# Patient Record
Sex: Female | Born: 1940 | Race: White | Hispanic: No | Marital: Married | Smoking: Never smoker
Health system: Southern US, Community
[De-identification: ages and names within clinical notes are randomized; demographics above are authoritative.]

## PROBLEM LIST (undated history)

## (undated) DIAGNOSIS — I493 Ventricular premature depolarization: Secondary | ICD-10-CM

## (undated) DIAGNOSIS — I340 Nonrheumatic mitral (valve) insufficiency: Secondary | ICD-10-CM

## (undated) DIAGNOSIS — I471 Supraventricular tachycardia, unspecified: Secondary | ICD-10-CM

## (undated) DIAGNOSIS — K219 Gastro-esophageal reflux disease without esophagitis: Secondary | ICD-10-CM

## (undated) DIAGNOSIS — I10 Essential (primary) hypertension: Secondary | ICD-10-CM

## (undated) DIAGNOSIS — I2510 Atherosclerotic heart disease of native coronary artery without angina pectoris: Secondary | ICD-10-CM

## (undated) DIAGNOSIS — Z8371 Family history of colonic polyps: Secondary | ICD-10-CM

## (undated) DIAGNOSIS — G629 Polyneuropathy, unspecified: Secondary | ICD-10-CM

## (undated) DIAGNOSIS — D509 Iron deficiency anemia, unspecified: Secondary | ICD-10-CM

## (undated) DIAGNOSIS — R42 Dizziness and giddiness: Secondary | ICD-10-CM

## (undated) DIAGNOSIS — M1990 Unspecified osteoarthritis, unspecified site: Secondary | ICD-10-CM

## (undated) DIAGNOSIS — Z83719 Family history of colon polyps, unspecified: Secondary | ICD-10-CM

## (undated) DIAGNOSIS — E039 Hypothyroidism, unspecified: Secondary | ICD-10-CM

## (undated) DIAGNOSIS — E785 Hyperlipidemia, unspecified: Secondary | ICD-10-CM

## (undated) HISTORY — PX: OTHER SURGICAL HISTORY: SHX169

## (undated) HISTORY — DX: Supraventricular tachycardia: I47.1

## (undated) HISTORY — DX: Essential (primary) hypertension: I10

## (undated) HISTORY — DX: Ventricular premature depolarization: I49.3

## (undated) HISTORY — DX: Iron deficiency anemia, unspecified: D50.9

## (undated) HISTORY — DX: Hyperlipidemia, unspecified: E78.5

## (undated) HISTORY — DX: Gastro-esophageal reflux disease without esophagitis: K21.9

## (undated) HISTORY — DX: Family history of colonic polyps: Z83.71

## (undated) HISTORY — PX: DILATION AND CURETTAGE OF UTERUS: SHX78

## (undated) HISTORY — PX: ABDOMINAL HYSTERECTOMY: SHX81

## (undated) HISTORY — DX: Nonrheumatic mitral (valve) insufficiency: I34.0

## (undated) HISTORY — DX: Supraventricular tachycardia, unspecified: I47.10

## (undated) HISTORY — PX: EYE SURGERY: SHX253

## (undated) HISTORY — DX: Family history of colon polyps, unspecified: Z83.719

## (undated) HISTORY — PX: ANKLE SURGERY: SHX546

## (undated) HISTORY — PX: TONSILLECTOMY: SUR1361

## (undated) SURGERY — Surgical Case
Anesthesia: *Unknown

---

## 2004-09-17 ENCOUNTER — Ambulatory Visit: Payer: Self-pay | Admitting: Ophthalmology

## 2004-09-17 HISTORY — PX: EYE SURGERY: SHX253

## 2005-08-07 ENCOUNTER — Ambulatory Visit: Payer: Self-pay

## 2006-08-10 ENCOUNTER — Ambulatory Visit: Payer: Self-pay

## 2007-08-16 ENCOUNTER — Ambulatory Visit: Payer: Self-pay

## 2008-08-22 ENCOUNTER — Ambulatory Visit: Payer: Self-pay

## 2009-01-28 ENCOUNTER — Ambulatory Visit: Payer: Self-pay | Admitting: Internal Medicine

## 2009-01-28 ENCOUNTER — Inpatient Hospital Stay (HOSPITAL_COMMUNITY): Admission: EM | Admit: 2009-01-28 | Discharge: 2009-01-29 | Payer: Self-pay | Admitting: Emergency Medicine

## 2009-01-29 ENCOUNTER — Encounter: Payer: Self-pay | Admitting: Cardiology

## 2009-02-04 ENCOUNTER — Telehealth (INDEPENDENT_AMBULATORY_CARE_PROVIDER_SITE_OTHER): Payer: Self-pay | Admitting: *Deleted

## 2009-02-05 ENCOUNTER — Ambulatory Visit: Payer: Self-pay

## 2009-02-05 ENCOUNTER — Ambulatory Visit: Payer: Self-pay | Admitting: Cardiology

## 2009-02-05 ENCOUNTER — Encounter: Payer: Self-pay | Admitting: Cardiology

## 2009-02-06 ENCOUNTER — Encounter: Payer: Self-pay | Admitting: Cardiology

## 2009-02-13 ENCOUNTER — Ambulatory Visit: Payer: Self-pay | Admitting: Cardiology

## 2009-05-14 ENCOUNTER — Encounter: Payer: Self-pay | Admitting: Cardiology

## 2009-05-14 ENCOUNTER — Ambulatory Visit: Payer: Self-pay | Admitting: Cardiovascular Disease

## 2009-05-14 DIAGNOSIS — E782 Mixed hyperlipidemia: Secondary | ICD-10-CM | POA: Insufficient documentation

## 2009-05-21 LAB — CONVERTED CEMR LAB
ALT: 13 units/L (ref 0–35)
Albumin: 4 g/dL (ref 3.5–5.2)
Cholesterol: 182 mg/dL (ref 0–200)
Total CHOL/HDL Ratio: 2.6
Total Protein: 6.7 g/dL (ref 6.0–8.3)
Triglycerides: 102 mg/dL (ref <150)
VLDL: 20 mg/dL (ref 0–40)

## 2009-08-16 ENCOUNTER — Ambulatory Visit: Payer: Self-pay | Admitting: Cardiology

## 2009-08-29 ENCOUNTER — Ambulatory Visit: Payer: Self-pay

## 2009-11-14 ENCOUNTER — Ambulatory Visit: Payer: Self-pay | Admitting: Cardiology

## 2009-11-15 LAB — CONVERTED CEMR LAB
ALT: 17 units/L (ref 0–35)
Alkaline Phosphatase: 49 units/L (ref 39–117)
Cholesterol: 209 mg/dL — ABNORMAL HIGH (ref 0–200)
Indirect Bilirubin: 0.2 mg/dL (ref 0.0–0.9)
Total Protein: 6.9 g/dL (ref 6.0–8.3)
Triglycerides: 123 mg/dL (ref <150)

## 2010-01-17 ENCOUNTER — Telehealth: Payer: Self-pay | Admitting: Cardiology

## 2010-03-12 ENCOUNTER — Ambulatory Visit: Payer: Self-pay | Admitting: Unknown Physician Specialty

## 2010-07-31 ENCOUNTER — Telehealth: Payer: Self-pay | Admitting: Cardiology

## 2010-08-28 ENCOUNTER — Ambulatory Visit: Payer: Self-pay | Admitting: Cardiology

## 2010-08-28 DIAGNOSIS — I471 Supraventricular tachycardia: Secondary | ICD-10-CM

## 2010-08-28 DIAGNOSIS — I10 Essential (primary) hypertension: Secondary | ICD-10-CM

## 2010-09-25 ENCOUNTER — Ambulatory Visit: Payer: Self-pay

## 2010-11-18 NOTE — Progress Notes (Signed)
Summary: BP  Phone Note Call from Patient Call back at Home Phone [phone]   Caller: self Call For: Gwenevere Goga Summary of Call: was told to go off of beta blocker-bp has been higher this week-what should she do? Initial call taken by: Harlon Flor,  January 17, 2010 3:03 PM  Follow-up for Phone Call        pts BP 150/80.  States that it is usually much lower than this.  instructed pt that this is ok- should increase fluids and relax (pt states she has been cleaning most of the day).  Reiterated criteria for need for medical evaluation. Follow-up by: Charlena Cross, RN, BSN,  January 17, 2010 3:16 PM

## 2010-11-18 NOTE — Progress Notes (Signed)
Summary: FYI  Phone Note Call from Patient   Caller: Patient Call For: [facility] Summary of Call: Pt called wanted to let Dr. Shirlee Latch know that she is going to start going to the [place] because it is closer to where she lives. Wanted to make it clear that she was no upset with Dr. Shirlee Latch at all and appreciated the care he have her.  Initial call taken by: Benedict Needy, RN,  July 31, 2010 11:31 AM

## 2010-11-18 NOTE — Letter (Signed)
Summary: [place] [employer] Work Engineer, agricultural at [employer]  [address], [place] [address]   Phone: [phone]  Fax: [phone]     August 28, 2010 MRN: [id number]   Alejandra Singh [address] [facility] [address] [place], [place]  [address]      YOUR LAB WORK IS DUE  December 01, 2010 _________________________________________  Please go to [facility], located across the street from [facility] on the second floor.  Hours are Monday - Friday 7am until 7:30pm         Saturday 8am until 12noon    _X_  DO NOT EAT OR DRINK AFTER MIDNIGHT EVENING PRIOR TO LABWORK  __ YOUR LABWORK IS NOT FASTING --YOU MAY EAT PRIOR TO LABWORK

## 2010-11-18 NOTE — Assessment & Plan Note (Signed)
Summary: Alejandra Singh / pt switching from [place] office due to convenience/tg   Visit Type:  Follow-up Primary Provider:  [facility]   History of Present Illness: 70 year old woman presents for a followup visit. She is new to the [place] clinic, previously followed by Dr. Shirlee Latch in [place]. Her history is reviewed below including prior cardiac testing. Symptomatically she is quite stable, reporting no significant palpitations. She has no exertional chest pain or unusual breathlessness. She walks regularly with her husband for exercise.  Followup labs from January 2011 showed cholesterol 209, triglycerides 123, HDL 57, LDL 127, AST 16, ALT 17. She was on low-dose Pravachol at that time, subsequently advanced to 80 mg a day. Followup lipid numbers in May 2011 showed total cholesterol 179, triglycerides 129, HDL 62, and LDL 91. She unfortunately was not able to tolerate Pravachol at high dose and stopped the medicine back in August. She is interested in switching to a different medication.  Blood pressure today was elevated. We discussed this. At home she reports much better blood pressure control, recently "116/65." We discussed sodium restriction.  Current Medications (verified): 1)  Aspirin 81 Mg Tbec (Aspirin) .... Take One Tablet By Mouth Daily 2)  Fish Oil 1000 Mg Caps (Omega-3 Fatty Acids) .Marland Kitchen.. 1 By Mouth Daily 3)  Multivitamins  Tabs (Multiple Vitamin) .Marland Kitchen.. 1 By Mouth Daily 4)  Probiotic  Caps (Probiotic Product) .... Take 1 Cap Daily 5)  Metoprolol Succinate 25 Mg Xr24h-Tab (Metoprolol Succinate) .... Take 1 Tablet By Mouth As Needed Palpitations 6)  Simvastatin 20 Mg Tabs (Simvastatin) .... Take 1 Tablet By Mouth At Bedtime  Allergies (verified): No Known Drug Allergies  Comments:  Nurse/Medical Assistant: patient stated meds not taking pravastatin any longer due to joints hurt  Past History:  Family History: Last updated: 08/28/2010 No family history of CAD Father:  deceased at age 75 from CVA Mother: deceased with Alzheimer's dementia  Social History: Last updated: 08/28/2010 Married - lives in [place] Tobacco Use - No.  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 History of iron-deficiency anemia Colonic polyps GERD Osteoporosis  Past Surgical History: Unremarkable  Family History: No family history of CAD Father: deceased at age 6 from CVA Mother: deceased with Alzheimer's dementia  Social History: Married - lives in [place] Tobacco Use - No.  Alcohol Use - no Drug Use - no Retired Engineer, manufacturing  Review of Systems  The patient denies anorexia, fever, weight loss, chest pain, syncope, dyspnea on exertion, peripheral edema, melena, and hematochezia.         Patient experienced leg weakness and fatigue on Pravachol at high dose. Otherwise reviewed and negative except as outlined.  Vital Signs:  Patient profile:   70 year old female Weight:      150 pounds O2 Sat:      97 % on Room air Pulse rate:   65 / minute BP sitting:   151 / 79  (right arm)  Vitals Entered By: Dreama Saa, CNA (August 28, 2010 8:21 AM)  O2 Flow:  Room air  Physical Exam  Additional Exam:  Normally nourished appearing woman in no acute distress. HEENT: Conjunctiva and lids are normal, oropharynx clear. Neck: Supple, no elevated JVP or bruits. Lungs: Clear to auscultation, nonlabored. Cardiac: Regular rate and rhythm, no S3 gallop or pericardial rub. Abdomen: Soft, nontender, bowel sounds present. Skin: Warm and dry. Extremities: No pitting edema, distal pulses  full. Musculoskeletal: No gross deformities. Neuropsychiatric: Alert and oriented x3, affect appropriate.   Echocardiogram  Procedure date:  01/29/2009  Findings:       Left ventricle: The cavity size was normal. Wall thickness was   normal. Systolic function was normal. The estimated  ejection   fraction was in the range of 55% to 60%. Wall motion was normal;   there were no regional wall motion abnormalities. The pulmonary vein   flow pattern was normal. Doppler parameters are consistent with   abnormal left ventricular relaxation (grade 1 diastolic   dysfunction).   Aortic valve: The noncoronary cusp appeared mildly restricted.   Mildly calcified annulus. Trileaflet. Doppler: There was no   stenosis. No significant regurgitation.   Aorta: Aortic root is normal.   Mitral valve: Structurally normal valve. Leaflet separation was   normal. Doppler: Transvalvular velocity was within the normal range.   There was no evidence for stenosis. No regurgitation.   Left atrium: The interatrial septum was not well visualized but   appears mobile, suggesting an atrial septal aneurysm. The atrium was   normal in size.   Pulmonary veins: Normal doppler pattern.   Right ventricle: The cavity size was normal. Wall thickness was   normal. Systolic function was normal.   Pulmonic valve: Grossly normal.   Tricuspid valve: No TR doppler jet was measured, unable to estimate   PA systolic pressure. Doppler: No significant regurgitation.   Right atrium: The atrium was normal in size.   Pericardium: There was no pericardial effusion.   Systemic veins: Not well visualized.   Nuclear Study  Procedure date:  02/05/2009  Findings:      Exercise Myoview, 10.1 METs, no chest pain. No diagnostic ST segment changes. Normal perfusion with LVEF greater than 60%. No scar or ischemia.  Event Monitor  Procedure date:  03/08/2009  Findings:      No significant arrhythmias.  EKG  Procedure date:  08/28/2010  Findings:      Normal sinus rhythm at 61 beats per minute with nonspecific ST changes.  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 Prescription provided  for Toprol-XL 25 mg p.r.n. She has not required this medicine as yet. Continue annual followup, sooner if needed.  Her updated medication list for this problem includes:    Aspirin 81 Mg Tbec (Aspirin) .Marland Kitchen... Take one tablet by mouth daily    Metoprolol Succinate 25 Mg Xr24h-tab (Metoprolol succinate) .Marland Kitchen... Take 1 tablet by mouth as needed palpitations  Problem # 2:  ESSENTIAL HYPERTENSION, BENIGN (ICD-401.1)  Blood pressure elevated today. Patient states that this is typically not the case. I asked her to continue to make home blood pressure checks, avoid sodium in the diet. Continue to follow with primary care provider.  Her updated medication list for this problem includes:    Aspirin 81 Mg Tbec (Aspirin) .Marland Kitchen... Take one tablet by mouth daily    Metoprolol Succinate 25 Mg Xr24h-tab (Metoprolol succinate) .Marland Kitchen... Take 1 tablet by mouth as needed palpitations  Problem # 3:  MIXED HYPERLIPIDEMIA (ICD-272.2)  At this point will plan to initiate simvastatin 20 mg p.o. q.h.s. If she tolerates this better than Pravachol, than repeat fasting lipid profile and liver function tests in about 3 months. Will aim for LDL control at least under 100. Her HDL has looked good overall.  The following medications were removed from the medication list:    Pravastatin Sodium  40 Mg Tabs (Pravastatin sodium) .Marland Kitchen... 1 tab by mouth daily Her updated medication list for this problem includes:    Simvastatin 20 Mg Tabs (Simvastatin) .Marland Kitchen... Take 1 tablet by mouth at bedtime  Future Orders: T-Lipid Profile ([id number]) ... 12/01/2010 T-Hepatic Function [phone]) ... 12/01/2010  Patient Instructions: 1)  Your physician recommends that you schedule a follow-up appointment in: 1 year 2)  Your physician recommends that you return for lab work in: 12 weeks 3)  Your physician has recommended you make the following change in your medication: Start taking Simvastatin 20mg  by mouth at bedtime  Prescriptions: METOPROLOL  SUCCINATE 25 MG XR24H-TAB (METOPROLOL SUCCINATE) take 1 tablet by mouth as needed palpitations  #30 x 6   Entered by:   Larita Fife Via LPN   Authorized by:   Loreli Slot, MD, [facility]   Signed by:   Larita Fife Via LPN on 34/74/2595   Method used:   Print then Give to Patient   RxID:   [id number] SIMVASTATIN 20 MG TABS (SIMVASTATIN) take 1 tablet by mouth at bedtime  #30 x 11   Entered by:   Larita Fife Via LPN   Authorized by:   Loreli Slot, MD, [facility]   Signed by:   Larita Fife Via LPN on 16/60/6301   Method used:   Print then Give to Patient   RxID:   [id number]

## 2010-12-04 ENCOUNTER — Encounter: Payer: Self-pay | Admitting: Cardiology

## 2010-12-08 ENCOUNTER — Encounter (INDEPENDENT_AMBULATORY_CARE_PROVIDER_SITE_OTHER): Payer: Self-pay | Admitting: *Deleted

## 2010-12-08 LAB — CONVERTED CEMR LAB
Albumin: 4.2 g/dL (ref 3.5–5.2)
Alkaline Phosphatase: 47 units/L (ref 39–117)
LDL Cholesterol: 82 mg/dL (ref 0–99)
Total Bilirubin: 0.2 mg/dL — ABNORMAL LOW (ref 0.3–1.2)

## 2010-12-16 NOTE — Letter (Signed)
Summary: [place] Results Engineer, agricultural at [facility]  [address] [place] [address]   Phone: [phone]  Fax: [phone]      December 08, 2010 MRN: [id number]   Alejandra Singh [address] [facility] [address] [place], [place]  [address]   Dear Ms. [facility],  Your test ordered by Selena Batten has been reviewed by your physician (or physician assistant) and was found to be normal or stable. Your physician (or physician assistant) felt no changes were needed at this time.  ____ Echocardiogram  ____ Cardiac Stress Test  _x___ Lab Work  ____ Peripheral vascular study of arms, legs or neck  ____ CT scan or X-ray  ____ Lung or Breathing test  ____ Other:  Lipids look much better per Dr. Diona Browner. Continue your simvastatin. Thank you, Damiel Barthold Allyne Gee RN    [place]mouth Bing, MD, Lenise Arena.C.Gaylord Shih, MD, F.A.C.C Lewayne Bunting, MD, F.A.C.C Nona Dell, MD, F.A.C.C Charlton Haws, MD, Lenise Arena.C.C

## 2011-01-28 LAB — POCT I-STAT, CHEM 8
BUN: 9 mg/dL (ref 6–23)
Calcium, Ion: 1.08 mmol/L — ABNORMAL LOW (ref 1.12–1.32)
Creatinine, Ser: <0.2 mg/dL — ABNORMAL LOW (ref 0.4–1.2)
Glucose, Bld: 124 mg/dL — ABNORMAL HIGH (ref 70–99)
TCO2: 15 mmol/L (ref 0–100)

## 2011-01-28 LAB — DIFFERENTIAL
Basophils Absolute: 0 10*3/uL (ref 0.0–0.1)
Basophils Relative: 0 % (ref 0–1)
Eosinophils Absolute: 0.2 10*3/uL (ref 0.0–0.7)
Eosinophils Relative: 1 % (ref 0–5)
Eosinophils Relative: 3 % (ref 0–5)
Lymphocytes Relative: 23 % (ref 12–46)
Lymphs Abs: 2 10*3/uL (ref 0.7–4.0)
Monocytes Absolute: 0.4 10*3/uL (ref 0.1–1.0)
Monocytes Relative: 6 % (ref 3–12)
Monocytes Relative: 7 % (ref 3–12)
Neutrophils Relative %: 58 % (ref 43–77)

## 2011-01-28 LAB — COMPREHENSIVE METABOLIC PANEL
ALT: 14 U/L (ref 0–35)
AST: 23 U/L (ref 0–37)
Alkaline Phosphatase: 40 U/L (ref 39–117)
CO2: 24 mEq/L (ref 19–32)
Calcium: 9 mg/dL (ref 8.4–10.5)
GFR calc Af Amer: >60 mL/min (ref >60)
GFR calc non Af Amer: >60 mL/min (ref >60)
Glucose, Bld: 90 mg/dL (ref 70–99)
Potassium: 3.5 mEq/L (ref 3.5–5.1)
Sodium: 138 mEq/L (ref 135–145)

## 2011-01-28 LAB — CBC
HCT: 38.3 % (ref 36.0–46.0)
HCT: 41 % (ref 36.0–46.0)
Hemoglobin: 13.4 g/dL (ref 12.0–15.0)
Hemoglobin: 14 g/dL (ref 12.0–15.0)
MCHC: 34.8 g/dL (ref 30.0–36.0)
MCV: 86.6 fL (ref 78.0–100.0)
Platelets: 227 10*3/uL (ref 150–400)
RBC: 4.43 MIL/uL (ref 3.87–5.11)
RBC: 4.64 MIL/uL (ref 3.87–5.11)
RDW: 13 % (ref 11.5–15.5)
RDW: 13.1 % (ref 11.5–15.5)
WBC: 6.2 10*3/uL (ref 4.0–10.5)

## 2011-01-28 LAB — LIPID PANEL
Cholesterol: 211 mg/dL — ABNORMAL HIGH (ref 0–200)
LDL Cholesterol: 144 mg/dL — ABNORMAL HIGH (ref 0–99)
Total CHOL/HDL Ratio: 4 RATIO
VLDL: 14 mg/dL (ref 0–40)

## 2011-01-28 LAB — PROTIME-INR: Prothrombin Time: 13.8 seconds (ref 11.6–15.2)

## 2011-01-28 LAB — HEPARIN LEVEL (UNFRACTIONATED): Heparin Unfractionated: 0.72 IU/mL — ABNORMAL HIGH (ref 0.30–0.70)

## 2011-01-28 LAB — CARDIAC PANEL(CRET KIN+CKTOT+MB+TROPI)
CK, MB: 5.9 ng/mL — ABNORMAL HIGH (ref 0.3–4.0)
CK, MB: 8.3 ng/mL — ABNORMAL HIGH (ref 0.3–4.0)
Relative Index: 3.5 — ABNORMAL HIGH (ref 0.0–2.5)
Relative Index: 3.9 — ABNORMAL HIGH (ref 0.0–2.5)
Total CK: 169 U/L (ref 7–177)
Total CK: 214 U/L — ABNORMAL HIGH (ref 7–177)
Troponin I: 0.01 ng/mL (ref 0.00–0.06)

## 2011-01-28 LAB — MAGNESIUM: Magnesium: 2.4 mg/dL (ref 1.5–2.5)

## 2011-01-28 LAB — POCT CARDIAC MARKERS: Troponin i, poc: <0.05 ng/mL (ref 0.00–0.09)

## 2011-01-28 LAB — CK TOTAL AND CKMB (NOT AT ARMC)
CK, MB: 4 ng/mL (ref 0.3–4.0)
Relative Index: INVALID (ref 0.0–2.5)
Total CK: 97 U/L (ref 7–177)

## 2011-03-03 NOTE — Discharge Summary (Signed)
Alejandra Singh, Alejandra Singh NO.:  [id number]   MEDICAL RECORD NO.:  [id number]          PATIENT TYPE:  INP   LOCATION:  2032                         FACILITY:  [facility]   PHYSICIAN:  Marca Ancona, MD      DATE OF BIRTH:  [birth year]   DATE OF ADMISSION:  01/28/2009  DATE OF DISCHARGE:  01/29/2009                               DISCHARGE SUMMARY   The patient is new to Cardiology.  Going forward, new, Dr. Marca Ancona  will be her primary cardiologist.   DISCHARGE DIAGNOSES:  1. Noncardiac chest pain.  2. Tachycardia (questionable atrioventricular node reentry      tachycardia).  3. Hyperlipidemia.   SECONDARY DIAGNOSIS:  Osteoporosis.   ALLERGIES:  NKDA.   PROCEDURE PERFORMED DURING THIS HOSPITALIZATION:  EKG showing  questionable AV nodal reentrant tachycardia with minimal ST elevation in  V3 and 1 mm of ST depression with T-wave abnormality in II, III, aVF,  and V4 through V6.  No significant Q-waves, no evidence of hypertrophy,  normal axis, intervals WNL, no prior tracing for comparison.  The  patient had CXR showing no active cardiopulmonary process.  The patient  had EKG performed on January 29, 2009, that showed normal sinus rhythm  with no acute ST-T-wave changes, no significant Q-waves, no evidence of  hypertrophy, minimal left axis deviation, intervals WNL.   HISTORY OF PRESENT ILLNESS:  This is a 70 year old female with no known  history of CAD and only risk factors being advanced age and  hyperlipidemia, presenting with chest discomfort, tachy palpitations  with EKG changes per the patient's report.  The patient was active,  walking this morning for 15 minutes without symptom.  She came home and  vacuumed her house again without any symptoms.  She took a shower and  went into the kitchen and at that point she began to feel like her heart  was racing and began to experience some chest discomfort.  She became  concerned enough to call EMS.  When they  arrived, they checked her heart  rate and BP.  Apparently per patient report, heart rate in the 120s.  However, these symptoms resolved and heart rate slowed while they were  there and she opted to be evaluated at the urgent care rather than be  transported by EMS to the ED.  While at the urgent care, the patient was  found to have significant ST-T-wave changes for possible ischemia and  different from prior tracings completed at this location.  The patient  was urged to go to the ED for evaluation.  Upon presentation to the  [facility] ED, the patient was tachycardia in the low 100s with what  looked like possible supraventricular tachycardia? AV nodal reentrant  tachycardia with EKG changes described in procedure section.  The  patient's chest discomfort had significantly improved.  The patient  denied associated symptoms like shortness of breath, nausea, vomiting,  significant chest pain but did report mild presyncopal symptoms while at  home along waiting for EMS.  The patient denies any syncopal episodes.  The patient denies any other new or  different symptoms recently.   HOSPITAL COURSE:  The patient was admitted, underwent procedures  described above, and she tolerated them well with no significant  complications.  The patient's EKG on January 29, 2009, without significant  ST-T-wave changes for ischemia and cardiac enzymes negative, 1 set of  point-of-care markers and 2 full sets of cardiac enzymes.  The patient  was also asymptomatic on January 29, 2009, and given the lack of objective  evidence at this point she was deemed stable for discharge.  The patient  has been scheduled for treadmill stress Myoview and a followup  appointment with Dr. Shirlee Latch in the next 2 weeks.  The patient will also  be scheduled for an event monitor to be worn for 3 weeks in the hopes of  capturing any tachyarrhythmia.  The patient will also be started on  Toprol-XL 25 mg p.o. daily for her tachycardic  arrhythmia and Zocor 20  mg p.o. daily for hyperlipidemia.  The patient will be given her new  medication list and prescriptions and followup instructions in both oral  and written form at the time of her discharge, should have no questions  or concerns that are not addressed.   The patient's vital signs remained stable when she was admitted.  Most  recent vital signs on day of discharge, temp 97.1 degrees Fahrenheit, BP  143/81, pulse 60, respiration rate 20, O2 saturation 96% on room air.   DISCHARGE LABORATORY DATA:  WBC 6.2 (normal differential), HGB 13.4, HCT  38.3, PLT count 227.  Sodium 138, potassium 3.5, chloride 106, CO2 24,  BUN 7, creatinine 0.65, glucose 90.  Total bilirubin 0.7, alkaline  phosphatase 40, AST 23, ALT 14, total protein 6.3.  Albumin 3.4, calcium  9.0, magnesium 2.4.  First set of point-of-care markers negative.  First  full set of cardiac enzymes negative.  Third set of cardiac markers  elevated for CK and CK-MB with relative index of 3.9, troponin I 0.02.  Total cholesterol was 211, triglycerides 70, HDL 53, LDL 144, VLDL 14.  TSH 4.31.   FOLLOWUP PLAN AND APPOINTMENTS:  1. Treadmill stress Myoview on February 05, 2009, at 9:30 a.m.  2. Dr. Shirlee Latch [facility]). February 13, 2009 at 10:00 a.m.  3. Event monitor (office will call and set up appointment).   DISCHARGE MEDICATIONS:  1. Toprol-XL 25 mg p.o. daily.  2. Aspirin 81 mg p.o. daily.  3. Simvastatin 20 mg p.o. daily.  4. Boniva (as previously prescribed).  5. Fish oil 1 g p.o. daily.  6. Multivitamin (per previous schedule).   Duration of discharge encounter including physician time was 35 minutes.      Jarrett Ables, [facility]      Marca Ancona, MD  Electronically Signed    MS/MEDQ  D:  01/29/2009  T:  01/30/2009  Job:  [id number]

## 2011-03-03 NOTE — Assessment & Plan Note (Signed)
[facility] OFFICE NOTE   Alejandra Singh, Alejandra Singh                       MRN:          [id number]  DATE:02/13/2009                            DOB:          02/12/1941    PRIMARY CARE PHYSICIAN:  Rolm Gala, MD, at the [facility].   HISTORY OF PRESENT ILLNESS:  This is a 70 year old with a history of  supraventricular tachycardia who returns to Cardiology Clinic for  evaluation.  The patient had had no heart trouble in her life until  April 2010.  She was at home doing housework when she developed a  sensation that her heart was racing, became somewhat lightheaded, and  felt some tightness across her chest.  She did call EMS.  When they  arrived, they found that her heart rate and blood pressure were  elevated.  EKG was suggestive of a supraventricular tachycardia probably  AV nodal reentry tachycardia.  The patient did spontaneously convert  back to normal sinus rhythm.  She was admitted to the hospital at [facility].  Cardiac enzymes were negative x3.  Initial EKG showed probable AV  nodal reentry tachycardia.  EKG after conversion showed normal sinus  rhythm, it is a normal EKG.  She had an echocardiogram done while in the  hospital that showed a structurally normal heart with a question of a  possible atrial septal aneurysm.  She was discharged and has been  wearing an event monitor.  She also had an exercise treadmill Myoview  done given the chest tightness she had during her tachycardic event.  This showed normal LV systolic function and good exercise tolerance.  It  was a normal study with no evidence for ischemia or infarction.  Since  discharge, the patient has been on Toprol-XL.  She has had no further  episodes of tachypalpitations.  She has been doing well in general, she  is tolerating her statin.  She is back to using a treadmill about 15-20  minutes a day.  She has not had shortness of breath with  exertion.  She  has had no further episodes of chest pain or tightness.   PAST MEDICAL HISTORY:  1. Supraventricular tachycardia, this was in April 2010.  This is      probable AV nodal reentry tachycardia.  This is the only episode      that the patient has ever had documented  2. Hyperlipidemia.  3. History of iron-deficiency anemia.  4. Gastroesophageal reflux disease.  5. Osteoporosis.  6. Echocardiogram done April 2010, EF 55%.  No regional wall motion      abnormalities.  There is a possible atrial septal aneurysm, though      the atrial septum is not very well seen.  7. Treadmill Myoview done April 2010.  EF is greater than 60%.  The      patient exercised 8 minutes reaching 10 METS.  This is a normal      study with no evidence for ischemia or infarction.  The patient had      no chest pain.  The  study was limited by fatigue.   EKG today is normal sinus rhythm, is normal EKG.   LABORATORY DATA:  From April 2010, TSH is normal, potassium 3.5,  creatinine 0.65, LDL 144, HDL 53, triglycerides 70.   SOCIAL HISTORY:  The patient lives in [place].  She is retired.  She  lives with her husband.  She does not smoke or drink any alcohol.  She  has never smoked.   FAMILY HISTORY:  There is no family history of premature coronary artery  disease.  The patient's father had a stroke at the age of 63.   REVIEW OF SYSTEMS:  All systems were reviewed and are negative except as  noted in history of present illness.   PHYSICAL EXAMINATION:  VITAL SIGNS:  Blood pressure 124/82, heart rate  56 and regular.  GENERAL:  This is a well-developed, elderly female in no apparent  distress.  NEUROLOGIC:  Alert and oriented x3.  Normal affect.  LUNGS:  Clear to auscultation bilaterally with normal respiratory  effort.  CARDIOVASCULAR:  Heart regular S1, S2.  No S3, no S4.  There is no  murmur.  There is no peripheral edema.  There are 2+ posterior tibial  pulses bilaterally.  There is no  carotid bruit.  NECK:  There is no JVD.  There is no thyromegaly or thyroid nodule.  ABDOMEN:  Soft, nontender.  No hepatosplenomegaly.  Normal bowel sounds.  EXTREMITIES:  No clubbing or cyanosis.  SKIN:  Normal exam.  MUSCULOSKELETAL:  Normal exam.  HEENT:  Normal exam.   ASSESSMENT AND PLAN:  This is a 70 year old woman who was recently  hospitalized with an episode of supraventricular tachycardia that  probably was AV nodal reentry tachycardia.  1. Supraventricular tachycardia.  A review of the EKG while the      patient was in the hospital showed a likely AV nodal reentry      tachycardia.  This is the first time the patient has actually noted      tachypalpitations in her life.  She has had no recurrences.  She is      to continue on Toprol-XL 25 mg a day.  If she has any further      problems on the beta blocker, we could consider AV nodal reentry      tachycardia ablation.  The patient has on a 3-week event monitor.      We will assess this for any recurrent episodes of SVT while on      therapy.  2. Hyperlipidemia.  The patient is now on Zocor.  She is tolerating      with no problems.  We will check her lipids and liver in 3 months.  3. Chest pain.  The patient had chest tightness during her episode of      SVT.  She had a treadmill Myoview.  She did well exercising      reaching 10 METS with no evidence for ischemia or infarction.  She      will continue her baby aspirin.  4. I will see the patient back in followup in 6 months.     Marca Ancona, MD  Electronically Signed    DM/MedQ  DD: 02/13/2009  DT: 02/13/2009  Job #: [id number]   cc:   Rolm Gala, M.D.

## 2011-03-03 NOTE — H&P (Signed)
Alejandra Singh, BURDO NO.:  [id number]   MEDICAL RECORD NO.:  [id number]          PATIENT TYPE:  INP   LOCATION:  2032                         FACILITY:  [facility]   PHYSICIAN:  Armanda Magic, M.D.     DATE OF BIRTH:  [birth year]   DATE OF ADMISSION:  01/28/2009  DATE OF DISCHARGE:                              HISTORY & PHYSICAL   The patient is new to our cardiology practice.   CHIEF COMPLAINT:  Tachycardia with chest discomfort.   HISTORY OF PRESENT ILLNESS:  A 70 year old female with no known history  of CAD and only risk factors being age and hypercholesterolemia was in  her usual state of health until this afternoon when she began to  experience tachy palpitations with chest discomfort.  The patient denies  nausea, vomiting, diaphoresis, or shortness of breath.  The patient did  have mild presyncopal symptoms, but no actual syncope.  The patient  reports exercising regularly including walks of 15-30 minutes up to 5  times a week including 15-minute walk this morning without symptoms.  The patient also vacuumed her house without any symptoms today.  After  vacuuming, the patient took a shower and when she was walking into her  living room she had acute onset of tachy palpitations as described  above.  The patient called EMS and when they arrived on the scene they  checked her blood pressure and heart rate, both were elevated, heart  rate up into the 120s.  SBP up to 150s.  However, her BP and heart rate  slowly decreased and she promised the EMS that she would be seen by  Urgent Care.  When she arrived in the Urgent Care, an EKG was performed,  per the patient showed changes from prior EKGs.  Currently, in ED the  patient is asymptomatic, in normal sinus rhythm with a rate in the 80s.   PAST MEDICAL HISTORY:  1. Hypercholesterolemia.  2. Iron deficiency anemia.  3. History of colonic polyps.  4. Osteoporosis.  5. GERD (mild, not on meds).   SOCIAL HISTORY:   The patient lives in [place] with her husband.  She  is retired from [employer]  She has no smoking, EtOH, or  illicit drug use history.  She takes multivitamin and fish oil.  She has  mostly a heart-healthy diet.  She has until recently been exercising  approximately 5 times a week.  Exercise equals 30-minute walk.   FAMILY HISTORY:  No family history of coronary artery disease, however,  father deceased at age 66 from CVA.  Mother deceased from Alzheimer.   REVIEW OF SYSTEMS:  Please see HPI, otherwise, mild injury to right  knee, inhibiting her exercise over the last week.  All other systems  reviewed and were negative.   ALLERGIES:  NKDA.   MEDICATIONS:  1. Boniva 150 mg p.o. daily.  2. Lovaza 1 g p.o. daily.  3. Calcium 1 tab p.o. daily.  4. Multivitamin 1 tab p.o. daily.   PHYSICAL EXAMINATION:  VITAL SIGNS:  Temp 99 degrees Fahrenheit, BP  123/70, pulse 106, respiration rate 18,  O2 saturation 96% on room air,  height 65 inches, weight 145 pounds.  GENERAL:  The patient is alert and oriented x3, in no apparent distress.  Able to speak in full sentences and move easily without any respiratory  distress.  HEENT:  Head was normocephalic and atraumatic.  Pupils equal, round, and  reactive to light.  Extraocular muscles were intact.  Nares were patent  without discharge.  Dentition was good.  Oropharynx without erythema or  exudate.  NECK:  Supple without lymphadenopathy.  No thyromegaly.  No JVD.  No  bruits.  HEART:  Regular with audible S1, S2.  No clicks, rubs, murmurs, or  gallops.  Pulses are 2+ and equal in both upper and lower extremities  bilaterally.  LUNGS:  Clear to auscultation bilaterally.  ABDOMEN:  Soft, nontender, and nondistended.  Normal abdominal bowel  sounds.  No rebound or guarding.  No hepatosplenomegaly and no  pulsations.  EXTREMITIES:  No clubbing, cyanosis, or edema.  SKIN:  No rashes, lesions, or petechiae.  MUSCULOSKELETAL:  No  joint deformities or effusion.  No spine or CVA  tenderness.  NEUROLOGIC:  Cranial nerves II through XII are grossly intact.  Strength  is 5/5 in all extremities and axial groups.  Normal sensation  throughout.  Normal cerebellar function.   RADIOLOGY:  Chest x-ray showed no acute disease.   EKG showed paroxysmal atrial tachycardia with 2:1 conduction with a  ventricular response rate of 101 bpm, 0.5-mm ST depression with T-waves  inversion in lead 3 and in V3, ST depression with T-wave inversion of 1  mm in II, aVF, V4 and V5.  T-wave inversion in V6.  No significant Q  waves.  Normal axis.  No evidence of hypertrophy.   LABORATORY DATA:  WBC 6.4, HGB 14.0, HCT 41.0, PLT count 228.  Sodium  129, potassium 3.9, chloride 111, CO2 of 15, BUN 9, creatinine less than  0.2, glucose 124.  First set of point-of-care markers were negative.   ASSESSMENT AND PLAN:  This is a 70 year old female with no known history  of CAD and the only risk factors being hypercholesterolemia and advanced  age presenting with acute onset of tachy palpitations and chest  discomfort and ST-T wave changes on EKG and in a paroxysmal atrial  tachycardia rhythm with 2:1 conduction.  1. Admit to [facility] Cardiology Telemetry.  We will cycle cardiac      enzymes.  Give full-dose aspirin, low-dose Lopressor, and      simvastatin.  We will start heparin per pharmacy and give      nitroglycerin p.r.n. for chest pain.  We will repeat EKG      immediately as well as in the morning.  The patient is likely a      cardiac cath candidate, but we will hold off on precath order for      now.  Consider EP eval secondary to supraventricular tachycardic      arrhythmia.      Jarrett Ables, [facility]      Armanda Magic, M.D.  Electronically Signed   MS/MEDQ  D:  01/28/2009  T:  01/29/2009  Job:  [id number]

## 2011-03-23 ENCOUNTER — Ambulatory Visit: Payer: Self-pay | Admitting: Otolaryngology

## 2011-04-06 ENCOUNTER — Ambulatory Visit: Payer: Self-pay | Admitting: Otolaryngology

## 2011-04-08 LAB — PATHOLOGY REPORT

## 2011-04-09 ENCOUNTER — Encounter: Payer: Self-pay | Admitting: Cardiology

## 2011-08-21 ENCOUNTER — Encounter: Payer: Self-pay | Admitting: Cardiology

## 2011-08-25 ENCOUNTER — Encounter: Payer: Self-pay | Admitting: Cardiology

## 2011-08-25 ENCOUNTER — Ambulatory Visit (INDEPENDENT_AMBULATORY_CARE_PROVIDER_SITE_OTHER): Payer: Medicare Other | Admitting: Cardiology

## 2011-08-25 VITALS — BP 150/82 | HR 70 | Resp 16 | Ht 65.0 in | Wt 153.0 lb

## 2011-08-25 DIAGNOSIS — I471 Supraventricular tachycardia, unspecified: Secondary | ICD-10-CM

## 2011-08-25 DIAGNOSIS — E782 Mixed hyperlipidemia: Secondary | ICD-10-CM

## 2011-08-25 NOTE — Assessment & Plan Note (Signed)
LDL well controlled on Simvastatin.

## 2011-08-25 NOTE — Assessment & Plan Note (Signed)
Quiescent at this point. No change to present therapy. Continue observation.

## 2011-08-25 NOTE — Patient Instructions (Signed)
Your physician recommends that you continue on your current medications as directed. Please refer to the Current Medication list given to you today.  Your physician recommends that you schedule a follow-up appointment in: 1 year  

## 2011-08-25 NOTE — Progress Notes (Signed)
Clinical Summary Alejandra Singh is a 70 y.o.female presenting for followup. She was seen in November 2011.  Follow up lab work from February of this year showed cholesterol 163, triglycerides 100, HDL 61, LDL 82, AST 16, ALT 15. Repeat LDL in March was 87. She is tolerating Simvastatin.  She has not had any significant palpitations or needed to use Toprol XL.  Reports stress over the last year related to husband's diagnosis of ENT cancer. We discussed this some today. They have been trying to walk regularly for exercise.   No Known Allergies  Medication list reviewed.  Past Medical History  Diagnosis Date  . 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 Hyperlipidemia   . Iron deficiency anemia   . FH: colonic polyps   . GERD (gastroesophageal reflux disease)   . Osteoporosis     No past surgical history on file.  Family History  Problem Relation Age of Onset  . Stroke Father   . Alzheimer's disease Mother   . Dementia Mother   . Coronary artery disease Neg Hx     Social History Ms. Northington reports that she has never smoked. She does not have any smokeless tobacco history on file. Ms. Buenaventura reports that she does not drink alcohol.  Review of Systems Negative except as outlined.  Physical Examination Filed Vitals:   08/25/11 1353  BP: 150/82  Pulse: 70  Resp: 16    Normally nourished appearing woman in no acute distress.  HEENT: Conjunctiva and lids are normal, oropharynx clear.  Neck: Supple, no elevated JVP or bruits.  Lungs: Clear to auscultation, nonlabored.  Cardiac: Regular rate and rhythm, no S3 gallop or pericardial rub.  Abdomen: Soft, nontender, bowel sounds present.  Skin: Warm and dry.  Extremities: No pitting edema, distal pulses full.  Musculoskeletal: No gross deformities.  Neuropsychiatric: Alert and oriented x3, affect appropriate.   ECG Normal sinus rhythm with leftward axis, nonspecific ST  changes.   Problem List and Plan

## 2011-09-06 ENCOUNTER — Other Ambulatory Visit: Payer: Self-pay | Admitting: Cardiology

## 2011-09-07 ENCOUNTER — Other Ambulatory Visit: Payer: Self-pay

## 2011-09-07 ENCOUNTER — Telehealth: Payer: Self-pay | Admitting: Cardiology

## 2011-09-07 MED ORDER — SIMVASTATIN 20 MG PO TABS: 20.0000 mg | ORAL_TABLET | Freq: Every day | ORAL | Status: DC

## 2011-09-07 NOTE — Telephone Encounter (Signed)
Patient needs updated RX for Simvastatin 20mg  sent to [facility] in [place] on [address] / tg

## 2011-09-29 ENCOUNTER — Ambulatory Visit: Payer: Self-pay

## 2012-08-26 ENCOUNTER — Encounter: Payer: Self-pay | Admitting: Cardiology

## 2012-08-26 ENCOUNTER — Ambulatory Visit (INDEPENDENT_AMBULATORY_CARE_PROVIDER_SITE_OTHER): Payer: Medicare Other | Admitting: Cardiology

## 2012-08-26 VITALS — BP 120/72 | HR 65 | Ht 65.0 in | Wt 153.0 lb

## 2012-08-26 DIAGNOSIS — I471 Supraventricular tachycardia, unspecified: Secondary | ICD-10-CM

## 2012-08-26 DIAGNOSIS — I10 Essential (primary) hypertension: Secondary | ICD-10-CM

## 2012-08-26 NOTE — Progress Notes (Signed)
   Clinical Summary Ms. Alejandra Singh is a 71 y.o.female presenting for followup. She was seen in November 2012. She continues to do well without significant palpitations. She has not needed PRN Toprol XL.  ECG today shows sinus rhythm with NSST changes. Labwork from March to be reviewed - she plans to bring it by the office.  She reports increased indigestion with ASA.  Walks 30 minutes a day with her husband.   No Known Allergies  Current Outpatient Prescriptions  Medication Sig Dispense Refill  . levothyroxine (SYNTHROID, LEVOTHROID) 50 MCG tablet Take 50 mcg by mouth daily.        . metoprolol succinate (TOPROL XL) 25 MG 24 hr tablet Take 25 mg by mouth as needed. AS NEEDED FOR PALPITATIONS      . Multiple Vitamins-Minerals (MULTIVITAL) tablet Take 1 tablet by mouth daily.        . Omega-3 Fatty Acids (FISH OIL) 1000 MG CAPS Take by mouth daily.        Marland Kitchen omeprazole (PRILOSEC) 20 MG capsule Take 20 mg by mouth daily.        Marland Kitchen PROBIOTIC CAPS Take by mouth daily.        . simvastatin (ZOCOR) 20 MG tablet Take 1 tablet (20 mg total) by mouth at bedtime.  30 tablet  12    Past Medical History  Diagnosis Date  . 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 Hyperlipidemia   . Iron deficiency anemia   . FH: colonic polyps   . GERD (gastroesophageal reflux disease)   . Osteoporosis     Social History Alejandra Singh reports that she has never smoked. She does not have any smokeless tobacco history on file. Alejandra Singh reports that she does not drink alcohol.  Review of Systems Negative except as outlined.  Physical Examination Filed Vitals:   08/26/12 1503  BP: 120/72  Pulse: 65   Filed Weights   08/26/12 1503  Weight: 153 lb 0.6 oz (69.418 kg)    HEENT: Conjunctiva and lids are normal, oropharynx clear.  Neck: Supple, no elevated JVP or bruits.  Lungs: Clear to auscultation, nonlabored.  Cardiac: Regular rate and rhythm, no S3 gallop or  pericardial rub.  Abdomen: Soft, nontender, bowel sounds present.  Skin: Warm and dry.  Extremities: No pitting edema, distal pulses full.    Problem List and Plan   PAROXYSMAL SUPRAVENTRICULAR TACHYCARDIA No obvious breakthrough. Has Toprol XL for  PRN.use. Keep annual followup.  ESSENTIAL HYPERTENSION, BENIGN Blood pressure normal today.    Jonelle Sidle, M.D., F.A.C.C.

## 2012-08-26 NOTE — Assessment & Plan Note (Signed)
No obvious breakthrough. Has Toprol XL for  PRN.use. Keep annual followup.

## 2012-08-26 NOTE — Assessment & Plan Note (Signed)
Blood pressure normal today. 

## 2012-08-26 NOTE — Patient Instructions (Addendum)
Your physician recommends that you schedule a follow-up appointment in: ONE YEAR  Your physician has recommended you make the following change in your medication:   1 STOP ASPIRIN

## 2012-09-30 ENCOUNTER — Other Ambulatory Visit: Payer: Self-pay | Admitting: Cardiology

## 2013-03-01 ENCOUNTER — Ambulatory Visit: Payer: Self-pay | Admitting: Family Medicine

## 2013-08-28 ENCOUNTER — Ambulatory Visit (INDEPENDENT_AMBULATORY_CARE_PROVIDER_SITE_OTHER): Payer: Medicare Other | Admitting: Cardiology

## 2013-08-28 ENCOUNTER — Encounter: Payer: Self-pay | Admitting: Cardiology

## 2013-08-28 VITALS — BP 135/77 | HR 72 | Ht 65.0 in | Wt 153.5 lb

## 2013-08-28 DIAGNOSIS — I471 Supraventricular tachycardia, unspecified: Secondary | ICD-10-CM

## 2013-08-28 DIAGNOSIS — E782 Mixed hyperlipidemia: Secondary | ICD-10-CM

## 2013-08-28 DIAGNOSIS — I10 Essential (primary) hypertension: Secondary | ICD-10-CM

## 2013-08-28 NOTE — Patient Instructions (Addendum)
Your physician wants you to follow-up in: 1 year  You will receive a reminder letter in the mail two months in advance. If you don't receive a letter, please call our office to schedule the follow-up appointment.  Your physician recommends that you continue on your current medications as directed. Please refer to the Current Medication list given to you today.  

## 2013-08-28 NOTE — Assessment & Plan Note (Signed)
Symptomatically quite stable, ECG is normal. Continue annual followup. No change in current regimen. Encouraged regular exercise as before.

## 2013-08-28 NOTE — Progress Notes (Signed)
    Clinical Summary Alejandra Singh is a 72 y.o.female last seen in November 2013. She is doing very well, walking for exercise most days of the week, reports NYHA class I dyspnea, no chest pain or palpitations. She has had no obvious recurrences of SVT since her last visit.  I reviewed outpatient lab work done by her primary care provider recently. Lipids look quite good, LDL 82, and HDL 69.  ECG today shows normal sinus rhythm.  No Known Allergies  Current Outpatient Prescriptions  Medication Sig Dispense Refill  . alendronate (FOSAMAX) 70 MG tablet Take 70 mg by mouth once a week. Take with a full glass of water on an empty stomach.      . levothyroxine (SYNTHROID, LEVOTHROID) 50 MCG tablet Take 50 mcg by mouth daily.        Marland Kitchen lovastatin (MEVACOR) 20 MG tablet Take 20 mg by mouth at bedtime.      . metoprolol succinate (TOPROL XL) 25 MG 24 hr tablet Take 25 mg by mouth as needed. AS NEEDED FOR PALPITATIONS      . Multiple Vitamins-Minerals (MULTIVITAL) tablet Take 1 tablet by mouth daily.        . Omega-3 Fatty Acids (FISH OIL) 1000 MG CAPS Take by mouth daily.        Marland Kitchen PROBIOTIC CAPS Take by mouth daily.         No current facility-administered medications for this visit.    Past Medical History  Diagnosis Date  . 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 Hyperlipidemia   . Iron deficiency anemia   . FH: colonic polyps   . GERD (gastroesophageal reflux disease)   . Osteoporosis     Social History Alejandra Singh reports that she has never smoked. She does not have any smokeless tobacco history on file. Alejandra Singh reports that she does not drink alcohol.  Review of Systems Negative except as outlined.  Physical Examination Filed Vitals:   08/28/13 1348  BP: 135/77  Pulse: 72   Filed Weights   08/28/13 1348  Weight: 153 lb 8 oz (69.627 kg)   Comfortable at rest. HEENT: Conjunctiva and lids are normal, oropharynx clear.  Neck: Supple,  no elevated JVP or bruits.  Lungs: Clear to auscultation, nonlabored.  Cardiac: Regular rate and rhythm, no S3 gallop or pericardial rub.  Abdomen: Soft, nontender, bowel sounds present.  Skin: Warm and dry.  Extremities: No pitting edema, distal pulses full.    Problem List and Plan   Paroxysmal supraventricular tachycardia Symptomatically quite stable, ECG is normal. Continue annual followup. No change in current regimen. Encouraged regular exercise as before.  Mixed hyperlipidemia Recent lipids look good, continues on Mevacor and omega-3 supplements.    Jonelle Sidle, M.D., F.A.C.C.

## 2013-08-28 NOTE — Assessment & Plan Note (Signed)
Recent lipids look good, continues on Mevacor and omega-3 supplements.

## 2014-03-13 ENCOUNTER — Encounter: Payer: Self-pay | Admitting: Cardiology

## 2014-03-21 ENCOUNTER — Telehealth: Payer: Self-pay | Admitting: *Deleted

## 2014-03-21 DIAGNOSIS — I10 Essential (primary) hypertension: Secondary | ICD-10-CM

## 2014-03-21 DIAGNOSIS — E782 Mixed hyperlipidemia: Secondary | ICD-10-CM

## 2014-03-21 DIAGNOSIS — I471 Supraventricular tachycardia: Secondary | ICD-10-CM

## 2014-03-21 NOTE — Telephone Encounter (Signed)
Please clarify by speaking with the patient. My last office note lists Mevacor as her statin. Maybe her primary care provider changed it.

## 2014-03-21 NOTE — Telephone Encounter (Signed)
rx faxed in for simvastatin 20 mg from [facility] in [place]

## 2014-03-21 NOTE — Telephone Encounter (Signed)
Had Rx refill for simvastatin, doesn't show on meds list but doesn't look like we discontinued it either. Please advise

## 2014-03-22 MED ORDER — SIMVASTATIN 20 MG PO TABS
20.0000 mg | ORAL_TABLET | Freq: Once | ORAL | Status: DC
Start: 2014-03-22 — End: 2016-02-03

## 2014-03-22 NOTE — Telephone Encounter (Signed)
Refilled simvastatin, placed order for FLP & LFT in 3 months. Explained and notified patient of refill of simvastatin and to get lab work done in [facility], patient understood and said she would pick up lab orders in July when her husband came in for appointment. Lab orders printed and place in pick up folder.

## 2014-03-22 NOTE — Telephone Encounter (Signed)
Refill simvastatin 20 mg daily. She will need to have FLP and LFT approximately 3 months after resuming this medication.

## 2014-03-22 NOTE — Telephone Encounter (Signed)
Spoke with patient, she stated she had been taking simvastatin since being seen in November, has never taken lovastatin. Patient stated she had labs in April 2015 and cholesterol was slightly elevated, although I could not find lipid panel in her lab results. Patient stated that her pcp has not changed any meds. Patient stated she has been out of simvastatin since April. Please advise

## 2014-06-18 LAB — HEPATIC FUNCTION PANEL
ALK PHOS: 47 U/L (ref 39–117)
ALT: 16 U/L (ref 0–35)
AST: 15 U/L (ref 0–37)
Albumin: 4.3 g/dL (ref 3.5–5.2)
Bilirubin, Direct: 0.1 mg/dL (ref 0.0–0.3)
Indirect Bilirubin: 0.4 mg/dL (ref 0.2–1.2)
TOTAL PROTEIN: 6.7 g/dL (ref 6.0–8.3)
Total Bilirubin: 0.5 mg/dL (ref 0.2–1.2)

## 2014-06-18 LAB — LIPID PANEL
Cholesterol: 200 mg/dL (ref 0–200)
HDL: 61 mg/dL (ref >39)
LDL Cholesterol: 103 mg/dL — ABNORMAL HIGH (ref 0–99)
Total CHOL/HDL Ratio: 3.3 Ratio
Triglycerides: 179 mg/dL — ABNORMAL HIGH (ref <150)
VLDL: 36 mg/dL (ref 0–40)

## 2014-07-30 ENCOUNTER — Encounter: Payer: Self-pay | Admitting: Cardiology

## 2014-09-03 ENCOUNTER — Ambulatory Visit: Payer: Medicare Other | Admitting: Cardiology

## 2014-09-07 ENCOUNTER — Encounter: Payer: Self-pay | Admitting: Cardiology

## 2014-09-07 ENCOUNTER — Ambulatory Visit (INDEPENDENT_AMBULATORY_CARE_PROVIDER_SITE_OTHER): Payer: Medicare Other | Admitting: Cardiology

## 2014-09-07 VITALS — BP 132/78 | HR 78 | Ht 65.0 in | Wt 155.0 lb

## 2014-09-07 DIAGNOSIS — I10 Essential (primary) hypertension: Secondary | ICD-10-CM

## 2014-09-07 DIAGNOSIS — I471 Supraventricular tachycardia: Secondary | ICD-10-CM

## 2014-09-07 NOTE — Assessment & Plan Note (Signed)
Quiescent. She has as needed beta blocker available, although has not required it over the last year. ECG reviewed and stable. Continue observation.

## 2014-09-07 NOTE — Assessment & Plan Note (Signed)
Reasonable blood pressure control today. Continue walking regimen for exercise.

## 2014-09-07 NOTE — Progress Notes (Signed)
   Reason for visit: PSVT  Clinical Summary Ms. Drakeford is a 73 y.o.female last seen in November 2014. She presents for a routine visit. Over the last year, she has had no significant palpitations, has not required any Toprol-XL. She walks for exercise, reports no angina symptoms, NYHA class II dyspnea. ECG today shows normal sinus rhythm with nonspecific ST changes.  Lab work from May of this year showed hemoglobin 13.4, platelets 249, BUN 13, creatinine 0.6, potassium 4.4, AST 18, ALT 16, LDL 145, TSH 1.6.   No Known Allergies  Current Outpatient Prescriptions  Medication Sig Dispense Refill  . levothyroxine (SYNTHROID, LEVOTHROID) 50 MCG tablet Take 50 mcg by mouth daily.      . Multiple Vitamins-Minerals (MULTIVITAL) tablet Take 1 tablet by mouth daily.      . Omega-3 Fatty Acids (FISH OIL) 1000 MG CAPS Take by mouth daily.      Marland Kitchen PROBIOTIC CAPS Take by mouth daily.      . simvastatin (ZOCOR) 20 MG tablet Take 1 tablet (20 mg total) by mouth once. 90 tablet 3   No current facility-administered medications for this visit.    Past Medical History  Diagnosis Date  . 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 Hyperlipidemia   . Iron deficiency anemia   . FH: colonic polyps   . GERD (gastroesophageal reflux disease)   . Osteoporosis     Social History Ms. Bohannon reports that she has never smoked. She does not have any smokeless tobacco history on file. Ms. Fishel reports that she does not drink alcohol.  Review of Systems Complete review of systems negative except as otherwise outlined in the clinical summary and also the following. Arthritic knee pain.  Physical Examination Filed Vitals:   09/07/14 0837  BP: 132/78  Pulse: 78   Filed Weights   09/07/14 0837  Weight: 155 lb (70.308 kg)   Comfortable at rest. HEENT: Conjunctiva and lids are normal, oropharynx clear.  Neck: Supple, no elevated JVP or bruits.  Lungs: Clear to  auscultation, nonlabored.  Cardiac: Regular rate and rhythm, no S3 gallop or pericardial rub.  Abdomen: Soft, nontender, bowel sounds present.  Skin: Warm and dry.  Extremities: No pitting edema, distal pulses full.    Problem List and Plan   Paroxysmal supraventricular tachycardia Quiescent. She has as needed beta blocker available, although has not required it over the last year. ECG reviewed and stable. Continue observation.  Essential hypertension, benign Reasonable blood pressure control today. Continue walking regimen for exercise.    Satira Sark, M.D., F.A.C.C.

## 2014-09-07 NOTE — Patient Instructions (Signed)
Your physician wants you to follow-up in: 1 year You will receive a reminder letter in the mail two months in advance. If you don't receive a letter, please call our office to schedule the follow-up appointment.    Your physician recommends that you continue on your current medications as directed. Please refer to the Current Medication list given to you today.     Thank you for choosing [facility] !  

## 2015-07-04 ENCOUNTER — Encounter: Payer: Self-pay | Admitting: *Deleted

## 2015-07-05 ENCOUNTER — Encounter
Admission: RE | Disposition: A | Discharge status: Home / Self Care | Payer: Self-pay | Source: Ambulatory Visit | Attending: Unknown Physician Specialty

## 2015-07-05 ENCOUNTER — Encounter: Payer: Self-pay | Admitting: *Deleted

## 2015-07-05 ENCOUNTER — Ambulatory Visit: Payer: Medicare Other | Admitting: Anesthesiology

## 2015-07-05 ENCOUNTER — Ambulatory Visit
Admission: RE | Admit: 2015-07-05 | Discharge: 2015-07-05 | Disposition: A | Discharge status: Home / Self Care | Payer: Medicare Other | Source: Ambulatory Visit | Attending: Unknown Physician Specialty | Admitting: Unknown Physician Specialty

## 2015-07-05 DIAGNOSIS — Z9889 Other specified postprocedural states: Secondary | ICD-10-CM | POA: Diagnosis not present

## 2015-07-05 DIAGNOSIS — I471 Supraventricular tachycardia: Secondary | ICD-10-CM | POA: Insufficient documentation

## 2015-07-05 DIAGNOSIS — Z79899 Other long term (current) drug therapy: Secondary | ICD-10-CM | POA: Diagnosis not present

## 2015-07-05 DIAGNOSIS — M810 Age-related osteoporosis without current pathological fracture: Secondary | ICD-10-CM | POA: Diagnosis not present

## 2015-07-05 DIAGNOSIS — Z823 Family history of stroke: Secondary | ICD-10-CM | POA: Insufficient documentation

## 2015-07-05 DIAGNOSIS — D122 Benign neoplasm of ascending colon: Secondary | ICD-10-CM | POA: Insufficient documentation

## 2015-07-05 DIAGNOSIS — Z86010 Personal history of colonic polyps: Secondary | ICD-10-CM | POA: Diagnosis present

## 2015-07-05 DIAGNOSIS — E785 Hyperlipidemia, unspecified: Secondary | ICD-10-CM | POA: Insufficient documentation

## 2015-07-05 DIAGNOSIS — Z820 Family history of epilepsy and other diseases of the nervous system: Secondary | ICD-10-CM | POA: Diagnosis not present

## 2015-07-05 DIAGNOSIS — Z818 Family history of other mental and behavioral disorders: Secondary | ICD-10-CM | POA: Insufficient documentation

## 2015-07-05 DIAGNOSIS — Z800 Family history of malignant neoplasm of digestive organs: Secondary | ICD-10-CM | POA: Diagnosis present

## 2015-07-05 DIAGNOSIS — E039 Hypothyroidism, unspecified: Secondary | ICD-10-CM | POA: Insufficient documentation

## 2015-07-05 DIAGNOSIS — K640 First degree hemorrhoids: Secondary | ICD-10-CM | POA: Diagnosis not present

## 2015-07-05 DIAGNOSIS — Z8379 Family history of other diseases of the digestive system: Secondary | ICD-10-CM | POA: Insufficient documentation

## 2015-07-05 DIAGNOSIS — K219 Gastro-esophageal reflux disease without esophagitis: Secondary | ICD-10-CM | POA: Diagnosis not present

## 2015-07-05 DIAGNOSIS — Z8249 Family history of ischemic heart disease and other diseases of the circulatory system: Secondary | ICD-10-CM | POA: Insufficient documentation

## 2015-07-05 DIAGNOSIS — K5730 Diverticulosis of large intestine without perforation or abscess without bleeding: Secondary | ICD-10-CM | POA: Insufficient documentation

## 2015-07-05 DIAGNOSIS — Z832 Family history of diseases of the blood and blood-forming organs and certain disorders involving the immune mechanism: Secondary | ICD-10-CM | POA: Diagnosis not present

## 2015-07-05 HISTORY — PX: COLONOSCOPY WITH PROPOFOL: SHX5780

## 2015-07-05 HISTORY — DX: Hypothyroidism, unspecified: E03.9

## 2015-07-05 SURGERY — COLONOSCOPY WITH PROPOFOL
Anesthesia: General

## 2015-07-05 MED ORDER — SODIUM CHLORIDE 0.9 % IV SOLN
INTRAVENOUS | Status: DC
  Administered 2015-07-05: 1000 mL via INTRAVENOUS
  Administered 2015-07-05: 09:00:00 via INTRAVENOUS

## 2015-07-05 MED ORDER — PROPOFOL 10 MG/ML IV BOLUS
INTRAVENOUS | Status: DC | PRN
  Administered 2015-07-05: 20 mg via INTRAVENOUS
  Administered 2015-07-05: 30 mg via INTRAVENOUS
  Administered 2015-07-05 (×7): 20 mg via INTRAVENOUS
  Administered 2015-07-05: 10 mg via INTRAVENOUS

## 2015-07-05 MED ORDER — MIDAZOLAM HCL 2 MG/2ML IJ SOLN
INTRAMUSCULAR | Status: DC | PRN
  Administered 2015-07-05: 1 mg via INTRAVENOUS

## 2015-07-05 NOTE — Anesthesia Preprocedure Evaluation (Signed)
Anesthesia Evaluation  Patient identified by MRN, date of birth, ID band Patient awake    Reviewed: Allergy & Precautions, H&P , NPO status , Patient's Chart, lab work & pertinent test results, reviewed documented beta blocker date and time   Airway Mallampati: II  TM Distance: >3 FB Neck ROM: full    Dental no notable dental hx. (+) Teeth Intact   Pulmonary neg pulmonary ROS,    Pulmonary exam normal breath sounds clear to auscultation       Cardiovascular Exercise Tolerance: Good hypertension, negative cardio ROS Normal cardiovascular exam+ dysrhythmias Supra Ventricular Tachycardia  Rhythm:regular Rate:Normal     Neuro/Psych negative neurological ROS  negative psych ROS   GI/Hepatic negative GI ROS, Neg liver ROS, GERD  ,  Endo/Other  negative endocrine ROSHypothyroidism   Renal/GU negative Renal ROS  negative genitourinary   Musculoskeletal   Abdominal   Peds  Hematology negative hematology ROS (+) anemia ,   Anesthesia Other Findings   Reproductive/Obstetrics negative OB ROS                             Anesthesia Physical Anesthesia Plan  ASA: III  Anesthesia Plan: General   Post-op Pain Management:    Induction:   Airway Management Planned:   Additional Equipment:   Intra-op Plan:   Post-operative Plan:   Informed Consent: I have reviewed the patients History and Physical, chart, labs and discussed the procedure including the risks, benefits and alternatives for the proposed anesthesia with the patient or authorized representative who has indicated his/her understanding and acceptance.   Dental Advisory Given  Plan Discussed with:   Anesthesia Plan Comments:         Anesthesia Quick Evaluation

## 2015-07-05 NOTE — H&P (Signed)
Primary Care Physician:  Tomasita Morrow, MD Primary Gastroenterologist:  Dr. Vira Agar  Pre-Procedure History & Physical: HPI:  Alejandra Singh is a 74 y.o. female is here for an colonoscopy.   Past Medical History  Diagnosis Date  . 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 Hyperlipidemia   . Iron deficiency anemia   . FH: colonic polyps   . GERD (gastroesophageal reflux disease)   . Osteoporosis   . Hypothyroidism     Past Surgical History  Procedure Laterality Date  . Tonsillectomy    . Broken ankle    . Dilation and curettage of uterus      Prior to Admission medications   Medication Sig Start Date End Date Taking? Authorizing Shayne Diguglielmo  ibandronate (BONIVA) 150 MG tablet Take 150 mg by mouth every 30 (thirty) days. Take in the morning with a full glass of water, on an empty stomach, and do not take anything else by mouth or lie down for the next 30 min.   Yes Historical Jatinder Mcdonagh, MD  levothyroxine (SYNTHROID, LEVOTHROID) 50 MCG tablet Take 50 mcg by mouth daily.     Yes Historical Johnay Mano, MD  omeprazole (PRILOSEC) 20 MG capsule Take 20 mg by mouth daily.   Yes Historical Taray Normoyle, MD  saccharomyces boulardii (FLORASTOR) 250 MG capsule Take 250 mg by mouth 2 (two) times daily.   Yes Historical Brooklynne Pereida, MD  metoprolol succinate (TOPROL-XL) 25 MG 24 hr tablet Take 25 mg by mouth daily.    Historical Akera Snowberger, MD  Multiple Vitamins-Minerals (MULTIVITAL) tablet Take 1 tablet by mouth daily.      Historical Hailey Miles, MD  Omega-3 Fatty Acids (FISH OIL) 1000 MG CAPS Take by mouth daily.      Historical Keymora Grillot, MD  PROBIOTIC CAPS Take by mouth daily.      Historical Sharlisa Hollifield, MD  simvastatin (ZOCOR) 20 MG tablet Take 1 tablet (20 mg total) by mouth once. 03/22/14   Satira Sark, MD    Allergies as of 06/10/2015  . (No Known Allergies)    Family History  Problem Relation Age of Onset  . Stroke Father   . Alzheimer's disease Mother    . Dementia Mother   . Coronary artery disease Neg Hx     Social History   Social History  . Marital Status: Married    Spouse Name: N/A  . Number of Children: N/A  . Years of Education: N/A   Occupational History  . Retired Information systems manager    Social History Main Topics  . Smoking status: Never Smoker   . Smokeless tobacco: Never Used  . Alcohol Use: No  . Drug Use: No  . Sexual Activity: Not on file   Other Topics Concern  . Not on file   Social History Narrative   Lives in [place]    Review of Systems: See HPI, otherwise negative ROS  Physical Exam: BP 130/70 mmHg  Pulse 65  Temp(Src) 97 F (36.1 C) (Oral)  Resp 16  Ht 5\' 5"  (1.651 m)  Wt 68.947 kg (152 lb)  BMI 25.29 kg/m2  SpO2 98% General:   Alert,  pleasant and cooperative in NAD Head:  Normocephalic and atraumatic. Neck:  Supple; no masses or thyromegaly. Lungs:  Clear throughout to auscultation.    Heart:  Regular rate and rhythm. Abdomen:  Soft, nontender and nondistended. Normal bowel sounds, without guarding, and without rebound.   Neurologic:  Alert and  oriented x4;  grossly normal  neurologically.  Impression/Plan: Alejandra Singh is here for an colonoscopy to be performed for Personal history colon polyps, family hx colon cancer in brother.  Risks, benefits, limitations, and alternatives regarding  colonoscopy have been reviewed with the patient.  Questions have been answered.  All parties agreeable.   Gaylyn Cheers, MD  07/05/2015, 8:56 AM

## 2015-07-05 NOTE — Op Note (Signed)
[facility] Gastroenterology Patient Name: Alejandra Singh Procedure Date: 07/05/2015 8:51 AM MRN: [id number] Account #: [id number] Date of Birth: 1941-03-10 Admit Type: Outpatient Age: 74 Room: [facility] ENDO ROOM 1 Gender: Female Note Status: Finalized Procedure:         Colonoscopy Indications:       Family history of colon cancer in a first-degree relative,                     Personal history of colonic polyps Providers:         Manya Silvas, MD Referring MD:      Daniel Nones, MD (Referring MD) Medicines:         Propofol per Anesthesia Complications:     No immediate complications. Procedure:         Pre-Anesthesia Assessment:                    - After reviewing the risks and benefits, the patient was                     deemed in satisfactory condition to undergo the procedure.                    After obtaining informed consent, the colonoscope was                     passed under direct vision. Throughout the procedure, the                     patient's blood pressure, pulse, and oxygen saturations                     were monitored continuously. The Olympus PCF-H180AL                     colonoscope ( S#: [id number] ) was introduced through the                     anus and advanced to the the cecum, identified by                     appendiceal orifice and ileocecal valve. The colonoscopy                     was performed without difficulty. The patient tolerated                     the procedure well. The quality of the bowel preparation                     was excellent. Findings:      A small polyp was found in the ascending colon. The polyp was sessile.       The polyp was removed with a hot snare. Resection and retrieval were       complete. One clip applied to the site.      Multiple small-mouthed diverticula were found in the sigmoid colon.      Internal hemorrhoids were found during endoscopy. The hemorrhoids were       small and Grade I  (internal hemorrhoids that do not prolapse). Impression:        - One small polyp in the ascending colon. Resected and  retrieved.                    - Diverticulosis in the sigmoid colon.                    - Internal hemorrhoids. Recommendation:    - Await pathology results. Manya Silvas, MD 07/05/2015 9:32:07 AM This report has been signed electronically. Number of Addenda: 0 Note Initiated On: 07/05/2015 8:51 AM Scope Withdrawal Time: 0 hours 10 minutes 59 seconds  Total Procedure Duration: 0 hours 18 minutes 49 seconds       [facility]

## 2015-07-06 ENCOUNTER — Encounter: Payer: Self-pay | Admitting: Unknown Physician Specialty

## 2015-07-08 LAB — SURGICAL PATHOLOGY

## 2015-07-16 ENCOUNTER — Encounter: Payer: Self-pay | Admitting: Unknown Physician Specialty

## 2015-07-16 NOTE — Anesthesia Postprocedure Evaluation (Signed)
  Anesthesia Post-op Note  Patient: Alejandra Singh  Procedure(s) Performed: Procedure(s): COLONOSCOPY WITH PROPOFOL (N/A)  Anesthesia type:General  Patient location: PACU  Post pain: Pain level controlled  Post assessment: Post-op Vital signs reviewed, Patient's Cardiovascular Status Stable, Respiratory Function Stable, Patent Airway and No signs of Nausea or vomiting  Post vital signs: Reviewed and stable  Last Vitals:  Filed Vitals:   07/05/15 1000  BP: 133/77  Pulse: 63  Temp:   Resp: 13    Level of consciousness: awake, alert  and patient cooperative  Complications: No apparent anesthesia complications

## 2015-07-16 NOTE — Transfer of Care (Signed)
Immediate Anesthesia Transfer of Care Note  Patient: Alejandra Singh  Procedure(s) Performed: Procedure(s): COLONOSCOPY WITH PROPOFOL (N/A)  Patient Location: PACU and Endoscopy Unit  Anesthesia Type:General  Level of Consciousness: sedated  Airway & Oxygen Therapy: Patient Spontanous Breathing and Patient connected to nasal cannula oxygen  Post-op Assessment: Report given to RN and Post -op Vital signs reviewed and stable  Post vital signs: stable  Last Vitals:  Filed Vitals:   07/05/15 1000  BP: 133/77  Pulse: 63  Temp:   Resp: 13    Complications: No apparent anesthesia complications

## 2015-09-06 ENCOUNTER — Encounter: Payer: Self-pay | Admitting: Cardiology

## 2015-09-06 ENCOUNTER — Ambulatory Visit (INDEPENDENT_AMBULATORY_CARE_PROVIDER_SITE_OTHER): Payer: Medicare Other | Admitting: Cardiology

## 2015-09-06 VITALS — BP 142/80 | HR 58 | Ht 64.0 in | Wt 155.0 lb

## 2015-09-06 DIAGNOSIS — R0609 Other forms of dyspnea: Secondary | ICD-10-CM

## 2015-09-06 DIAGNOSIS — R5383 Other fatigue: Secondary | ICD-10-CM

## 2015-09-06 DIAGNOSIS — I10 Essential (primary) hypertension: Secondary | ICD-10-CM | POA: Diagnosis not present

## 2015-09-06 DIAGNOSIS — I471 Supraventricular tachycardia, unspecified: Secondary | ICD-10-CM

## 2015-09-06 DIAGNOSIS — R0789 Other chest pain: Secondary | ICD-10-CM

## 2015-09-06 NOTE — Patient Instructions (Addendum)
Your physician wants you to follow-up in: 1 year with Dr Ferne Reus will receive a reminder letter in the mail two months in advance. If you don't receive a letter, please call our office to schedule the follow-up appointment.    Your physician recommends that you continue on your current medications as directed. Please refer to the Current Medication list given to you today.    If you need a refill on your cardiac medications before your next appointment, please call your pharmacy.     Your physician has requested that you have a stress echocardiogram. For further information please visit [email or web address]. Please follow instruction sheet as given. DO NOT TAKE METOPROLOL THE DAY BEFORE OR THE DAY OF TEST     Thank you for choosing [facility] !

## 2015-09-06 NOTE — Progress Notes (Signed)
Cardiology Office Note  Date: 09/06/2015   ID: KENTRELL FROMME, DOB 09-15-1941, MRN [id number]  PCP: Tomasita Morrow, MD  Primary Cardiologist: Rozann Lesches, MD   Chief Complaint  Patient presents with  . PSVT    History of Present Illness: Alejandra Singh is a 74 y.o. female last seen in November 2015. She presents for a routine follow-up visit. She has not been troubled by any significant palpitations or obvious breakthrough PSVT, has not required Toprol-XL.  She does telemetry that she has felt fatigued with activity, more so than normal, somewhat short of breath, but she does not have to stop what she is doing. She does not report any classic angina symptoms, just an "unusual feeling" sometimes. She has been concerned because her sister-in-law suddenly developed heart problems in her early 86s and reportedly had similar symptoms which were fairly vague. She has not undergone any recent stress testing.  ECG today shows sinus bradycardia, otherwise normal.  She had lab work done by her primary care provider back in May, reviewed below. LDL was 118.   Past Medical History  Diagnosis Date  . 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 Hyperlipidemia   . Iron deficiency anemia   . FH: colonic polyps   . GERD (gastroesophageal reflux disease)   . Osteoporosis   . Hypothyroidism     Current Outpatient Prescriptions  Medication Sig Dispense Refill  . ibandronate (BONIVA) 150 MG tablet Take 150 mg by mouth every 30 (thirty) days. Take in the morning with a full glass of water, on an empty stomach, and do not take anything else by mouth or lie down for the next 30 min.    Marland Kitchen levothyroxine (SYNTHROID, LEVOTHROID) 50 MCG tablet Take 50 mcg by mouth daily.      . metoprolol succinate (TOPROL-XL) 25 MG 24 hr tablet Take 25 mg by mouth daily as needed.     . Multiple Vitamins-Minerals (MULTIVITAL) tablet Take 1 tablet by mouth daily.      .  Omega-3 Fatty Acids (FISH OIL) 1000 MG CAPS Take by mouth daily.      Marland Kitchen omeprazole (PRILOSEC) 20 MG capsule Take 20 mg by mouth daily as needed.     Marland Kitchen PROBIOTIC CAPS Take by mouth daily.      . simvastatin (ZOCOR) 20 MG tablet Take 1 tablet (20 mg total) by mouth once. 90 tablet 3   No current facility-administered medications for this visit.    Allergies:  Review of patient's allergies indicates no known allergies.   Social History: The patient  reports that she has never smoked. She has never used smokeless tobacco. She reports that she does not drink alcohol or use illicit drugs.   ROS:  Please see the history of present illness. Otherwise, complete review of systems is positive for none.  All other systems are reviewed and negative.   Physical Exam: VS:  BP 142/80 mmHg  Pulse 58  Ht 5\' 4"  (1.626 m)  Wt 155 lb (70.308 kg)  BMI 26.59 kg/m2, BMI Body mass index is 26.59 kg/(m^2).  Wt Readings from Last 3 Encounters:  09/06/15 155 lb (70.308 kg)  07/05/15 152 lb (68.947 kg)  09/07/14 155 lb (70.308 kg)     Comfortable at rest. HEENT: Conjunctiva and lids are normal, oropharynx clear.  Neck: Supple, no elevated JVP or bruits.  Lungs: Clear to auscultation, nonlabored.  Cardiac: Regular rate and rhythm, no S3 gallop or  pericardial rub.  Abdomen: Soft, nontender, bowel sounds present.  Skin: Warm and dry.  Extremities: No pitting edema, distal pulses full.    ECG: ECG is ordered today.  Recent Labwork:  May 2016: Hemoglobin 13.5, platelets 252, cholesterol 205, HDL 61, LDL 118, triglycerides 131, TSH 1.5  Other Studies Reviewed Today:  Echocardiogram 01/29/2009: Left ventricle: The cavity size was normal. Wall thickness was normal. Systolic function was normal. The estimated ejection fraction was in the range of 55% to 60%. Wall motion was normal; there were no regional wall motion abnormalities. The pulmonary vein flow pattern was normal. Doppler parameters  are consistent with abnormal left ventricular relaxation (grade 1 diastolic dysfunction). Aortic valve: The noncoronary cusp appeared mildly restricted. Mildly calcified annulus. Trileaflet. Doppler: There was no stenosis. No significant regurgitation. Aorta: Aortic root is normal. Mitral valve: Structurally normal valve. Leaflet separation was normal. Doppler: Transvalvular velocity was within the normal range. There was no evidence for stenosis. No regurgitation. Left atrium: The interatrial septum was not well visualized but appears mobile, suggesting an atrial septal aneurysm. The atrium was normal in size. Pulmonary veins: Normal doppler pattern. Right ventricle: The cavity size was normal. Wall thickness was normal. Systolic function was normal. Pulmonic valve: Grossly normal. Tricuspid valve: No TR doppler jet was measured, unable to estimate PA systolic pressure. Doppler: No significant regurgitation. Right atrium: The atrium was normal in size. Pericardium: There was no pericardial effusion. Systemic veins: Not well visualized.   ASSESSMENT AND PLAN:  1. History of PSVT, no obvious recurrence in the last year. She has Toprol-XL available as needed. ECG reviewed and stable.  2. Exertional fatigue with mild dyspnea, and an "unusual" feeling. She does state that this has been more prominent over the last several months. Recent TSH within normal limits. She does not have any known history of CAD, cardiac risk factors including hyperlipidemia. We will evaluate with an exercise echocardiogram for further risk stratification.  Current medicines were reviewed at length with the patient today.   Orders Placed This Encounter  Procedures  . EKG 12-Lead  . Echo stress    Disposition: FU with me in 1 year.   Signed, Satira Sark, MD, [facility] 09/06/2015 10:35 AM    [facility] [address], [place], [place]  [address] Phone: [phone]; Fax: [phone]

## 2015-09-18 ENCOUNTER — Ambulatory Visit (HOSPITAL_COMMUNITY)
Admission: RE | Admit: 2015-09-18 | Discharge: 2015-09-18 | Disposition: A | Discharge status: Home / Self Care | Payer: Medicare Other | Source: Ambulatory Visit | Attending: Cardiology | Admitting: Cardiology

## 2015-09-18 ENCOUNTER — Encounter (HOSPITAL_COMMUNITY): Payer: Self-pay

## 2015-09-18 DIAGNOSIS — R0600 Dyspnea, unspecified: Secondary | ICD-10-CM | POA: Insufficient documentation

## 2015-09-18 DIAGNOSIS — R0789 Other chest pain: Secondary | ICD-10-CM

## 2015-09-18 LAB — ECHOCARDIOGRAM STRESS TEST
CHL CUP MPHR: 147 {beats}/min
CSEPEDS: 18 s
CSEPEW: 7.5 METS
CSEPPHR: 153 {beats}/min
Exercise duration (min): 6 min
Percent HR: 104 %
RPE: 17
Rest HR: 67 {beats}/min

## 2016-02-03 ENCOUNTER — Other Ambulatory Visit: Payer: Self-pay | Admitting: Cardiology

## 2016-02-06 ENCOUNTER — Other Ambulatory Visit: Payer: Self-pay | Admitting: *Deleted

## 2016-02-06 MED ORDER — SIMVASTATIN 20 MG PO TABS: 20.0000 mg | ORAL_TABLET | Freq: Every day | ORAL | Status: DC

## 2016-02-18 ENCOUNTER — Other Ambulatory Visit: Payer: Self-pay | Admitting: Nurse Practitioner

## 2016-02-18 DIAGNOSIS — R1013 Epigastric pain: Secondary | ICD-10-CM

## 2016-02-19 ENCOUNTER — Other Ambulatory Visit: Payer: Self-pay | Admitting: Nurse Practitioner

## 2016-02-21 ENCOUNTER — Ambulatory Visit
Admission: RE | Admit: 2016-02-21 | Discharge: 2016-02-21 | Disposition: A | Discharge status: Home / Self Care | Payer: Medicare Other | Source: Ambulatory Visit | Attending: Nurse Practitioner | Admitting: Nurse Practitioner

## 2016-02-21 ENCOUNTER — Other Ambulatory Visit: Payer: Self-pay | Admitting: Nurse Practitioner

## 2016-02-21 DIAGNOSIS — R1013 Epigastric pain: Secondary | ICD-10-CM | POA: Insufficient documentation

## 2016-02-21 DIAGNOSIS — R9389 Abnormal findings on diagnostic imaging of other specified body structures: Secondary | ICD-10-CM

## 2016-02-21 DIAGNOSIS — R0789 Other chest pain: Secondary | ICD-10-CM

## 2016-02-24 ENCOUNTER — Ambulatory Visit
Admission: RE | Admit: 2016-02-24 | Discharge: 2016-02-24 | Disposition: A | Discharge status: Home / Self Care | Payer: Medicare Other | Source: Ambulatory Visit | Attending: Nurse Practitioner | Admitting: Nurse Practitioner

## 2016-02-24 DIAGNOSIS — J479 Bronchiectasis, uncomplicated: Secondary | ICD-10-CM | POA: Insufficient documentation

## 2016-02-24 DIAGNOSIS — I700 Atherosclerosis of aorta: Secondary | ICD-10-CM | POA: Diagnosis not present

## 2016-02-24 DIAGNOSIS — R0789 Other chest pain: Secondary | ICD-10-CM | POA: Diagnosis present

## 2016-02-24 DIAGNOSIS — R938 Abnormal findings on diagnostic imaging of other specified body structures: Secondary | ICD-10-CM | POA: Diagnosis present

## 2016-02-24 DIAGNOSIS — R9389 Abnormal findings on diagnostic imaging of other specified body structures: Secondary | ICD-10-CM

## 2016-03-18 ENCOUNTER — Encounter: Payer: Self-pay | Admitting: *Deleted

## 2016-03-19 ENCOUNTER — Encounter
Admission: RE | Disposition: A | Discharge status: Home / Self Care | Payer: Self-pay | Source: Ambulatory Visit | Attending: Unknown Physician Specialty

## 2016-03-19 ENCOUNTER — Ambulatory Visit: Payer: Medicare Other | Admitting: Anesthesiology

## 2016-03-19 ENCOUNTER — Ambulatory Visit
Admission: RE | Admit: 2016-03-19 | Discharge: 2016-03-19 | Disposition: A | Discharge status: Home / Self Care | Payer: Medicare Other | Source: Ambulatory Visit | Attending: Unknown Physician Specialty | Admitting: Unknown Physician Specialty

## 2016-03-19 ENCOUNTER — Encounter: Payer: Self-pay | Admitting: *Deleted

## 2016-03-19 DIAGNOSIS — Z79899 Other long term (current) drug therapy: Secondary | ICD-10-CM | POA: Diagnosis not present

## 2016-03-19 DIAGNOSIS — E039 Hypothyroidism, unspecified: Secondary | ICD-10-CM | POA: Insufficient documentation

## 2016-03-19 DIAGNOSIS — R12 Heartburn: Secondary | ICD-10-CM | POA: Diagnosis present

## 2016-03-19 DIAGNOSIS — Z862 Personal history of diseases of the blood and blood-forming organs and certain disorders involving the immune mechanism: Secondary | ICD-10-CM | POA: Insufficient documentation

## 2016-03-19 DIAGNOSIS — E785 Hyperlipidemia, unspecified: Secondary | ICD-10-CM | POA: Insufficient documentation

## 2016-03-19 DIAGNOSIS — M810 Age-related osteoporosis without current pathological fracture: Secondary | ICD-10-CM | POA: Diagnosis not present

## 2016-03-19 DIAGNOSIS — K210 Gastro-esophageal reflux disease with esophagitis: Secondary | ICD-10-CM | POA: Diagnosis not present

## 2016-03-19 DIAGNOSIS — I10 Essential (primary) hypertension: Secondary | ICD-10-CM | POA: Insufficient documentation

## 2016-03-19 DIAGNOSIS — K2960 Other gastritis without bleeding: Secondary | ICD-10-CM | POA: Insufficient documentation

## 2016-03-19 DIAGNOSIS — Z8371 Family history of colonic polyps: Secondary | ICD-10-CM | POA: Diagnosis not present

## 2016-03-19 HISTORY — PX: ESOPHAGOGASTRODUODENOSCOPY (EGD) WITH PROPOFOL: SHX5813

## 2016-03-19 SURGERY — ESOPHAGOGASTRODUODENOSCOPY (EGD) WITH PROPOFOL
Anesthesia: General

## 2016-03-19 MED ORDER — MIDAZOLAM HCL 2 MG/2ML IJ SOLN
INTRAMUSCULAR | Status: DC | PRN
  Administered 2016-03-19: 1 mg via INTRAVENOUS

## 2016-03-19 MED ORDER — LIDOCAINE HCL (CARDIAC) 20 MG/ML IV SOLN
INTRAVENOUS | Status: DC | PRN
  Administered 2016-03-19: 30 mg via INTRAVENOUS

## 2016-03-19 MED ORDER — PROPOFOL 500 MG/50ML IV EMUL
INTRAVENOUS | Status: DC | PRN
  Administered 2016-03-19: 100 ug/kg/min via INTRAVENOUS

## 2016-03-19 MED ORDER — SODIUM CHLORIDE 0.9 % IV SOLN
INTRAVENOUS | Status: DC
  Administered 2016-03-19: 09:00:00 via INTRAVENOUS

## 2016-03-19 MED ORDER — FENTANYL CITRATE (PF) 100 MCG/2ML IJ SOLN
INTRAMUSCULAR | Status: DC | PRN
  Administered 2016-03-19: 50 ug via INTRAVENOUS

## 2016-03-19 NOTE — Anesthesia Procedure Notes (Signed)
Performed by: COOK-MARTIN, Karmen Altamirano Pre-anesthesia Checklist: Patient identified, Emergency Drugs available, Suction available, Patient being monitored and Timeout performed Patient Re-evaluated:Patient Re-evaluated prior to inductionOxygen Delivery Method: Nasal cannula Preoxygenation: Pre-oxygenation with 100% oxygen Intubation Type: IV induction Airway Equipment and Method: Bite block Placement Confirmation: CO2 detector and positive ETCO2     

## 2016-03-19 NOTE — H&P (Signed)
Primary Care Physician:  [facility] Primary Gastroenterologist:  Dr. Vira Agar  Pre-Procedure History & Physical: HPI:  Alejandra Singh is a 75 y.o. female is here for an endoscopy.   Past Medical History  Diagnosis Date  . 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 Hyperlipidemia   . Iron deficiency anemia   . FH: colonic polyps   . GERD (gastroesophageal reflux disease)   . Osteoporosis   . Hypothyroidism     Past Surgical History  Procedure Laterality Date  . Tonsillectomy    . Broken ankle    . Dilation and curettage of uterus    . Colonoscopy with propofol N/A 07/05/2015    Procedure: COLONOSCOPY WITH PROPOFOL;  Surgeon: Manya Silvas, MD;  Location: [facility] ENDOSCOPY;  Service: Endoscopy;  Laterality: N/A;    Prior to Admission medications   Medication Sig Start Date End Date Taking? Authorizing Provider  esomeprazole (NEXIUM) 40 MG capsule Take 40 mg by mouth daily at 12 noon.   Yes Historical Provider, MD  levothyroxine (SYNTHROID, LEVOTHROID) 50 MCG tablet Take 50 mcg by mouth daily.     Yes Historical Provider, MD  metoprolol succinate (TOPROL-XL) 25 MG 24 hr tablet Take 25 mg by mouth daily as needed.    Yes Historical Provider, MD  Multiple Vitamins-Minerals (MULTIVITAL) tablet Take 1 tablet by mouth daily.     Yes Historical Provider, MD  Omega-3 Fatty Acids (FISH OIL) 1000 MG CAPS Take by mouth daily.     Yes Historical Provider, MD  PROBIOTIC CAPS Take by mouth daily.     Yes Historical Provider, MD  saccharomyces boulardii (FLORASTOR) 250 MG capsule Take 250 mg by mouth 2 (two) times daily.   Yes Historical Provider, MD  simvastatin (ZOCOR) 20 MG tablet Take 1 tablet (20 mg total) by mouth daily. 02/06/16  Yes Satira Sark, MD  ibandronate (BONIVA) 150 MG tablet Take 150 mg by mouth every 30 (thirty) days. Take in the morning with a full glass of water, on an empty stomach, and do not take anything  else by mouth or lie down for the next 30 min.    Historical Provider, MD  omeprazole (PRILOSEC) 20 MG capsule Take 20 mg by mouth daily as needed.     Historical Provider, MD    Allergies as of 03/09/2016  . (No Known Allergies)    Family History  Problem Relation Age of Onset  . Stroke Father   . Alzheimer's disease Mother   . Dementia Mother   . Coronary artery disease Neg Hx     Social History   Social History  . Marital Status: Married    Spouse Name: N/A  . Number of Children: N/A  . Years of Education: N/A   Occupational History  . Retired Information systems manager    Social History Main Topics  . Smoking status: Never Smoker   . Smokeless tobacco: Never Used  . Alcohol Use: No  . Drug Use: No  . Sexual Activity: Not on file   Other Topics Concern  . Not on file   Social History Narrative   Lives in [place]    Review of Systems: See HPI, otherwise negative ROS  Physical Exam: BP 155/76 mmHg  Pulse 60  Temp(Src) 97.7 F (36.5 C) (Tympanic)  Resp 14  Ht 5\' 4"  (1.626 m)  Wt 68.493 kg (151 lb)  BMI 25.91 kg/m2  SpO2 100% General:  Alert,  pleasant and cooperative in NAD Head:  Normocephalic and atraumatic. Neck:  Supple; no masses or thyromegaly. Lungs:  Clear throughout to auscultation.    Heart:  Regular rate and rhythm. Abdomen:  Soft, nontender and nondistended. Normal bowel sounds, without guarding, and without rebound.   Neurologic:  Alert and  oriented x4;  grossly normal neurologically.  Impression/Plan: Alejandra Singh is here for an endoscopy to be performed for GERD abd heartburn  Risks, benefits, limitations, and alternatives regarding  endoscopy have been reviewed with the patient.  Questions have been answered.  All parties agreeable.   Gaylyn Cheers, MD  03/19/2016, 9:02 AM

## 2016-03-19 NOTE — Anesthesia Preprocedure Evaluation (Signed)
Anesthesia Evaluation  Patient identified by MRN, date of birth, ID band Patient awake    Reviewed: Allergy & Precautions, H&P , NPO status , Patient's Chart, lab work & pertinent test results, reviewed documented beta blocker date and time   Airway Mallampati: II   Neck ROM: full    Dental  (+) Poor Dentition, Teeth Intact   Pulmonary neg pulmonary ROS,    Pulmonary exam normal        Cardiovascular hypertension, negative cardio ROS Normal cardiovascular exam Rhythm:regular Rate:Normal     Neuro/Psych negative neurological ROS  negative psych ROS   GI/Hepatic negative GI ROS, Neg liver ROS, GERD  Medicated,  Endo/Other  negative endocrine ROSHypothyroidism   Renal/GU negative Renal ROS  negative genitourinary   Musculoskeletal   Abdominal   Peds  Hematology negative hematology ROS (+) anemia ,   Anesthesia Other Findings Past Medical History:   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   Hyperlipidemia                                               Iron deficiency anemia                                       FH: colonic polyps                                           GERD (gastroesophageal reflux disease)                       Osteoporosis                                                 Hypothyroidism                                             Past Surgical History:   TONSILLECTOMY                                                 broken ankle                                                  DILATION AND CURETTAGE OF UTERUS                              COLONOSCOPY WITH PROPOFOL                       N/A 07/05/2015  Comment:Procedure: COLONOSCOPY WITH PROPOFOL;  Surgeon:              Manya Silvas, MD;  Location[facility]               ENDOSCOPY;  Service: Endoscopy;  Laterality:               N/A; BMI    Body Mass Index   25.90 kg/m 2      Reproductive/Obstetrics                             Anesthesia Physical Anesthesia Plan  ASA: III  Anesthesia Plan: General   Post-op Pain Management:    Induction:   Airway Management Planned:   Additional Equipment:   Intra-op Plan:   Post-operative Plan:   Informed Consent: I have reviewed the patients History and Physical, chart, labs and discussed the procedure including the risks, benefits and alternatives for the proposed anesthesia with the patient or authorized representative who has indicated his/her understanding and acceptance.   Dental Advisory Given  Plan Discussed with: CRNA  Anesthesia Plan Comments:         Anesthesia Quick Evaluation

## 2016-03-19 NOTE — Op Note (Signed)
[facility] Gastroenterology Patient Name: Alejandra Singh Procedure Date: 03/19/2016 8:58 AM MRN: [id number] Account #: [id number] Date of Birth: December 09, 1940 Admit Type: Outpatient Age: 75 Room: [facility] ENDO ROOM 2 Gender: Female Note Status: Finalized Procedure:            Upper GI endoscopy Indications:          Heartburn, Suspected gastro-esophageal reflux disease Providers:            Manya Silvas, MD Referring MD:         [place]:            Propofol per Anesthesia Complications:        No immediate complications. Procedure:            Pre-Anesthesia Assessment:                       - After reviewing the risks and benefits, the patient                        was deemed in satisfactory condition to undergo the                        procedure.                       After obtaining informed consent, the endoscope was                        passed under direct vision. Throughout the procedure,                        the patient's blood pressure, pulse, and oxygen                        saturations were monitored continuously. The                        Colonoscope was introduced through the mouth, and                        advanced to the second part of duodenum. The upper GI                        endoscopy was accomplished without difficulty. The                        patient tolerated the procedure well. Findings:      LA Grade A (one or more mucosal breaks less than 5 mm, not extending       between tops of 2 mucosal folds) esophagitis with no bleeding was found       40 cm from the incisors. Biopsies were taken with a cold forceps for       histology.      Diffuse mild inflammation characterized by erythema and granularity was       found in the gastric body and in the gastric antrum. Biopsies were taken       with a cold forceps for histology. Biopsies were taken with a cold       forceps for Helicobacter pylori testing.  The examined duodenum was normal. Impression:           -  LA Grade A reflux esophagitis. Rule out Barrett's                        esophagus. Biopsied.                       - Gastritis. Biopsied.                       - Normal examined duodenum. Recommendation:       - Await pathology results. Manya Silvas, MD 03/19/2016 9:20:57 AM This report has been signed electronically. Number of Addenda: 0 Note Initiated On: 03/19/2016 8:58 AM      [facility]

## 2016-03-19 NOTE — Transfer of Care (Signed)
Immediate Anesthesia Transfer of Care Note  Patient: Alejandra Singh  Procedure(s) Performed: Procedure(s): ESOPHAGOGASTRODUODENOSCOPY (EGD) WITH PROPOFOL (N/A)  Patient Location: PACU  Anesthesia Type:General  Level of Consciousness: awake, alert  and sedated  Airway & Oxygen Therapy: Patient Spontanous Breathing and Patient connected to nasal cannula oxygen  Post-op Assessment: Report given to RN  Post vital signs: Reviewed and stable  Last Vitals:  Filed Vitals:   03/19/16 0837  BP: 155/76  Pulse: 60  Temp: 36.5 C  Resp: 14    Last Pain: There were no vitals filed for this visit.       Complications: No apparent anesthesia complications

## 2016-03-20 NOTE — Anesthesia Postprocedure Evaluation (Signed)
Anesthesia Post Note  Patient: CHASELYN MARONE  Procedure(s) Performed: Procedure(s) (LRB): ESOPHAGOGASTRODUODENOSCOPY (EGD) WITH PROPOFOL (N/A)  Patient location during evaluation: PACU Anesthesia Type: General Level of consciousness: awake and alert Pain management: pain level controlled Vital Signs Assessment: post-procedure vital signs reviewed and stable Respiratory status: spontaneous breathing, nonlabored ventilation, respiratory function stable and patient connected to nasal cannula oxygen Cardiovascular status: blood pressure returned to baseline and stable Postop Assessment: no signs of nausea or vomiting Anesthetic complications: no    Last Vitals:  Filed Vitals:   03/19/16 0942 03/19/16 0952  BP: 137/76 137/80  Pulse: 72 70  Temp:    Resp: 13 19    Last Pain: There were no vitals filed for this visit.               Molli Barrows

## 2016-03-22 DIAGNOSIS — I2510 Atherosclerotic heart disease of native coronary artery without angina pectoris: Secondary | ICD-10-CM | POA: Diagnosis present

## 2016-03-23 LAB — SURGICAL PATHOLOGY

## 2016-06-06 ENCOUNTER — Telehealth: Payer: Self-pay | Admitting: Cardiology

## 2016-06-06 NOTE — Telephone Encounter (Signed)
Received page regarding patient's blood pressure, returned call to the patient. She was concerned that her HR was 49 with her home blood pressure machine, BP at the time was 142/79, she retook her BP and it was 121/74 and HR was 54. She is concerned that the numbers are "flucuating" and says that she "just doesn't feel right". Denies chest pain, denies shortness of breath, denies dizziness and palpitations. She says she feels "weakness in my chest, I can't explain it". I advised patient to not take her metoprolol today (she only takes for palpitations, not daily). I advised patient if she continues to feel bad, then she can go to urgent care to be further evaluated, but she is unable to tell me exactly what is bothering her. She does say she feels highly anxious. I advised her to call back if she develops any new symptoms.

## 2016-09-09 NOTE — Progress Notes (Deleted)
Cardiology Office Note  Date: 09/09/2016   ID: Allena, Shrode 07/04/1941, MRN [id number]  PCP: [facility]  Primary Cardiologist: Rozann Lesches, MD   No chief complaint on file.   History of Present Illness: Alejandra Singh is a 75 y.o. female last seen in November 2016.  Exercise echocardiogram from last year is outlined below.  Past Medical History:  Diagnosis Date  . FH: colonic polyps   . GERD (gastroesophageal reflux disease)   . Hyperlipidemia   . Hypothyroidism   . Iron deficiency anemia   . Osteoporosis   . 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 broken ankle    . COLONOSCOPY WITH PROPOFOL N/A 07/05/2015   Procedure: COLONOSCOPY WITH PROPOFOL;  Surgeon: Manya Silvas, MD;  Location: [facility] ENDOSCOPY;  Service: Endoscopy;  Laterality: N/A;  . DILATION AND CURETTAGE OF UTERUS    . ESOPHAGOGASTRODUODENOSCOPY (EGD) WITH PROPOFOL N/A 03/19/2016   Procedure: ESOPHAGOGASTRODUODENOSCOPY (EGD) WITH PROPOFOL;  Surgeon: Manya Silvas, MD;  Location: [facility] ENDOSCOPY;  Service: Endoscopy;  Laterality: N/A;  . TONSILLECTOMY      Current Outpatient Prescriptions  Medication Sig Dispense Refill  . esomeprazole (NEXIUM) 40 MG capsule Take 40 mg by mouth daily at 12 noon.    . ibandronate (BONIVA) 150 MG tablet Take 150 mg by mouth every 30 (thirty) days. Take in the morning with a full glass of water, on an empty stomach, and do not take anything else by mouth or lie down for the next 30 min.    Marland Kitchen levothyroxine (SYNTHROID, LEVOTHROID) 50 MCG tablet Take 50 mcg by mouth daily.      . metoprolol succinate (TOPROL-XL) 25 MG 24 hr tablet Take 25 mg by mouth daily as needed.     . Multiple Vitamins-Minerals (MULTIVITAL) tablet Take 1 tablet by mouth daily.      . Omega-3 Fatty Acids (FISH OIL) 1000 MG CAPS Take by mouth daily.      Marland Kitchen omeprazole (PRILOSEC) 20  MG capsule Take 20 mg by mouth daily as needed.     Marland Kitchen PROBIOTIC CAPS Take by mouth daily.      Marland Kitchen saccharomyces boulardii (FLORASTOR) 250 MG capsule Take 250 mg by mouth 2 (two) times daily.    . simvastatin (ZOCOR) 20 MG tablet Take 1 tablet (20 mg total) by mouth daily. 90 tablet 3   No current facility-administered medications for this visit.    Allergies:  Alendronate   Social History: The patient  reports that she has never smoked. She has never used smokeless tobacco. She reports that she does not drink alcohol or use drugs.   Family History: The patient's family history includes Alzheimer's disease in her mother; Dementia in her mother; Stroke in her father.   ROS:  Please see the history of present illness. Otherwise, complete review of systems is positive for {NONE DEFAULTED:18576::"none"}.  All other systems are reviewed and negative.   Physical Exam: VS:  There were no vitals taken for this visit., BMI There is no height or weight on file to calculate BMI.  Wt Readings from Last 3 Encounters:  03/19/16 151 lb (68.5 kg)  09/06/15 155 lb (70.3 kg)  07/05/15 152 lb (68.9 kg)    Comfortable at rest. HEENT: Conjunctiva and lids are normal, oropharynx clear.  Neck: Supple, no elevated JVP or bruits.  Lungs: Clear to auscultation, nonlabored.  Cardiac: Regular rate and rhythm, no S3 gallop or pericardial rub.  Abdomen: Soft, nontender, bowel sounds present.  Skin: Warm and dry.  Extremities: No pitting edema, distal pulses full.   ECG: I personally reviewed the tracing from 09/06/2015 which showed sinus bradycardia.  Recent Labwork:  May 2016: Hemoglobin 13.5, platelets 252, cholesterol 205, HDL 61, LDL 118, triglycerides 131, TSH 1.5  Other Studies Reviewed Today:  Exercise echocardiogram 09/18/2015: Study Conclusions  - Stress ECG conclusions: The stress ECG was equivocal for   ischemia. [facility] scoring: exercise time of 6 min; maximum ST   deviation of 1 mm; no  angina; resulting score is 1. This score   predicts a moderate risk of cardiac events, although "false   positive" changes are to be considered in light of reassuring   echocardiographic images. - Staged echo: There was no echocardiographic evidence for   stress-induced ischemia.  Chest CT 02/24/2016: IMPRESSION: 1. No acute pulmonary findings.  No worrisome pulmonary lesions. 2. Mild tortuosity, ectasia and calcification of the thoracic aorta but no focal aneurysm. Coronary artery calcifications are noted. 3. No mediastinal or hilar mass or lymphadenopathy. 4. Focal area of bronchiectasis and scarring in the right middle.  Assessment and Plan:   Current medicines were reviewed with the patient today.  No orders of the defined types were placed in this encounter.   Disposition:  Signed, Satira Sark, MD, [facility] 09/09/2016 11:42 AM     [facility] [place] [address] [place], [place] [address] Phone: [phone]; Fax: [phone]

## 2016-09-14 ENCOUNTER — Ambulatory Visit: Payer: Medicare Other | Admitting: Cardiology

## 2016-09-14 ENCOUNTER — Telehealth: Payer: Self-pay | Admitting: Cardiology

## 2016-09-14 NOTE — Telephone Encounter (Signed)
Pt lvm and would like to speak w/ someone about some problems she's been having please give her a call @ [phone]

## 2016-09-14 NOTE — Telephone Encounter (Signed)
Pt was worried that she should be seen sooner than her 12/19 appointment. I did offer her an earlier appointment with an N.P./P.A. She stated that she would rather see Dr. Domenic Polite. I let her know if she starts having problems to call and let us know.

## 2016-09-20 ENCOUNTER — Observation Stay (HOSPITAL_COMMUNITY)
Admission: EM | Admit: 2016-09-20 | Discharge: 2016-09-22 | Disposition: A | Discharge status: Home / Self Care | Payer: Medicare Other | Attending: Cardiology | Admitting: Cardiology

## 2016-09-20 ENCOUNTER — Emergency Department (HOSPITAL_COMMUNITY): Payer: Medicare Other

## 2016-09-20 ENCOUNTER — Encounter (HOSPITAL_COMMUNITY): Payer: Self-pay | Admitting: Emergency Medicine

## 2016-09-20 DIAGNOSIS — R001 Bradycardia, unspecified: Secondary | ICD-10-CM | POA: Insufficient documentation

## 2016-09-20 DIAGNOSIS — I10 Essential (primary) hypertension: Secondary | ICD-10-CM | POA: Insufficient documentation

## 2016-09-20 DIAGNOSIS — K219 Gastro-esophageal reflux disease without esophagitis: Secondary | ICD-10-CM | POA: Diagnosis not present

## 2016-09-20 DIAGNOSIS — Z79899 Other long term (current) drug therapy: Secondary | ICD-10-CM | POA: Diagnosis not present

## 2016-09-20 DIAGNOSIS — I2584 Coronary atherosclerosis due to calcified coronary lesion: Secondary | ICD-10-CM | POA: Diagnosis not present

## 2016-09-20 DIAGNOSIS — R079 Chest pain, unspecified: Secondary | ICD-10-CM | POA: Diagnosis not present

## 2016-09-20 DIAGNOSIS — I471 Supraventricular tachycardia: Secondary | ICD-10-CM | POA: Insufficient documentation

## 2016-09-20 DIAGNOSIS — I25110 Atherosclerotic heart disease of native coronary artery with unstable angina pectoris: Secondary | ICD-10-CM | POA: Diagnosis not present

## 2016-09-20 DIAGNOSIS — E782 Mixed hyperlipidemia: Secondary | ICD-10-CM | POA: Insufficient documentation

## 2016-09-20 DIAGNOSIS — I2510 Atherosclerotic heart disease of native coronary artery without angina pectoris: Secondary | ICD-10-CM | POA: Diagnosis present

## 2016-09-20 DIAGNOSIS — E039 Hypothyroidism, unspecified: Secondary | ICD-10-CM | POA: Insufficient documentation

## 2016-09-20 DIAGNOSIS — I200 Unstable angina: Secondary | ICD-10-CM | POA: Diagnosis present

## 2016-09-20 LAB — MAGNESIUM: MAGNESIUM: 2.3 mg/dL (ref 1.7–2.4)

## 2016-09-20 LAB — BASIC METABOLIC PANEL
Anion gap: 8 (ref 5–15)
BUN: 13 mg/dL (ref 6–20)
CHLORIDE: 106 mmol/L (ref 101–111)
CO2: 23 mmol/L (ref 22–32)
CREATININE: 0.62 mg/dL (ref 0.44–1.00)
Calcium: 9.4 mg/dL (ref 8.9–10.3)
Glucose, Bld: 96 mg/dL (ref 65–99)
POTASSIUM: 3.9 mmol/L (ref 3.5–5.1)
SODIUM: 137 mmol/L (ref 135–145)

## 2016-09-20 LAB — CBC
HEMATOCRIT: 41.2 % (ref 36.0–46.0)
Hemoglobin: 13.9 g/dL (ref 12.0–15.0)
MCH: 29.1 pg (ref 26.0–34.0)
MCHC: 33.7 g/dL (ref 30.0–36.0)
MCV: 86.2 fL (ref 78.0–100.0)
PLATELETS: 275 10*3/uL (ref 150–400)
RBC: 4.78 MIL/uL (ref 3.87–5.11)
RDW: 13.8 % (ref 11.5–15.5)
WBC: 7.2 10*3/uL (ref 4.0–10.5)

## 2016-09-20 LAB — TROPONIN I: Troponin I: <0.03 ng/mL (ref <0.03)

## 2016-09-20 LAB — PROTIME-INR
INR: 0.9
PROTHROMBIN TIME: 12.1 s (ref 11.4–15.2)

## 2016-09-20 LAB — TSH: TSH: 3.144 u[IU]/mL (ref 0.350–4.500)

## 2016-09-20 LAB — APTT: aPTT: 28 seconds (ref 24–36)

## 2016-09-20 LAB — I-STAT TROPONIN, ED: TROPONIN I, POC: 0 ng/mL (ref 0.00–0.08)

## 2016-09-20 MED ORDER — HEPARIN BOLUS VIA INFUSION
4000.0000 [IU] | Freq: Once | INTRAVENOUS | Status: AC
  Administered 2016-09-20: 4000 [IU] via INTRAVENOUS

## 2016-09-20 MED ORDER — ONDANSETRON HCL 4 MG/2ML IJ SOLN: 4.0000 mg | Freq: Four times a day (QID) | INTRAMUSCULAR | Status: DC | PRN

## 2016-09-20 MED ORDER — METOPROLOL SUCCINATE ER 25 MG PO TB24
25.0000 mg | ORAL_TABLET | Freq: Every day | ORAL | Status: DC
  Filled 2016-09-20 (×2): qty 1

## 2016-09-20 MED ORDER — LEVOTHYROXINE SODIUM 50 MCG PO TABS
50.0000 ug | ORAL_TABLET | Freq: Every day | ORAL | Status: DC
  Administered 2016-09-21: 50 ug via ORAL
  Filled 2016-09-20 (×2): qty 1

## 2016-09-20 MED ORDER — MORPHINE SULFATE (PF) 2 MG/ML IV SOLN
2.0000 mg | INTRAVENOUS | Status: DC | PRN
Start: 2016-09-20 — End: 2016-09-22
  Administered 2016-09-21: 2 mg via INTRAVENOUS
  Filled 2016-09-20: qty 1

## 2016-09-20 MED ORDER — SIMVASTATIN 20 MG PO TABS
20.0000 mg | ORAL_TABLET | Freq: Every day | ORAL | Status: DC
  Administered 2016-09-20: 20 mg via ORAL
  Filled 2016-09-20: qty 1

## 2016-09-20 MED ORDER — HEPARIN (PORCINE) IN NACL 100-0.45 UNIT/ML-% IJ SOLN
750.0000 [IU]/h | INTRAMUSCULAR | Status: DC
  Administered 2016-09-20 (×2): 750 [IU]/h via INTRAVENOUS
  Filled 2016-09-20: qty 250

## 2016-09-20 MED ORDER — ASPIRIN EC 325 MG PO TBEC: 325.0000 mg | DELAYED_RELEASE_TABLET | Freq: Every day | ORAL | Status: DC

## 2016-09-20 MED ORDER — ACETAMINOPHEN 325 MG PO TABS: 650.0000 mg | ORAL_TABLET | ORAL | Status: DC | PRN

## 2016-09-20 MED ORDER — ASPIRIN 325 MG PO TABS
325.0000 mg | ORAL_TABLET | Freq: Once | ORAL | Status: AC
  Administered 2016-09-20: 325 mg via ORAL
  Filled 2016-09-20: qty 1

## 2016-09-20 MED ORDER — ENOXAPARIN SODIUM 80 MG/0.8ML ~~LOC~~ SOLN
70.0000 mg | Freq: Two times a day (BID) | SUBCUTANEOUS | Status: AC
  Administered 2016-09-20 – 2016-09-21 (×3): 70 mg via SUBCUTANEOUS
  Filled 2016-09-20 (×3): qty 0.8

## 2016-09-20 NOTE — ED Notes (Signed)
Call for report- Brandy cannot take report and will call back in 5 minutes

## 2016-09-20 NOTE — ED Triage Notes (Signed)
Pt c/o weakness x 1 week with chest pressure today.

## 2016-09-20 NOTE — ED Provider Notes (Addendum)
[facility] DEPT Provider Note   CSN: [id number] Arrival date & time: 09/20/16  1757     History   Chief Complaint Chief Complaint  Patient presents with  . Chest Pain    HPI Alejandra Singh is a 75 y.o. female.  75 year old female history of hyperlipidemia and hypertension presents to the emergency department secondary to chest pain. Patient states she's been weak for about a month but over the last week's episode aggressively increasing episodes of chest pain with source of breath and nausea with exertion that improves with rest. It happened again today this morning that lasted for about an hour but then went away. After church it began and continued for a few hours and presented the emergency department for the same ever had resolves prior to my evaluation. No history of heart disease. No leg swelling, recent trips, estrogen, family history of heart disease. No other associated symptoms. No modifying factors. She also notes multiple episodes of feeling of low heart rate. As low as the upper 30s.      Past Medical History:  Diagnosis Date  . FH: colonic polyps   . GERD (gastroesophageal reflux disease)   . Hyperlipidemia   . Hypothyroidism   . Iron deficiency anemia   . Osteoporosis   . 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 Chest pain 09/20/2016  . Hypothyroidism   . Gastroesophageal reflux disease   . Essential hypertension, benign 08/28/2010  . Paroxysmal supraventricular tachycardia ([place]) 08/28/2010  . Mixed hyperlipidemia 05/14/2009    Past Surgical History:  Procedure Laterality Date  . broken ankle    . COLONOSCOPY WITH PROPOFOL N/A 07/05/2015   Procedure: COLONOSCOPY WITH PROPOFOL;  Surgeon: Manya Silvas, MD;  Location: [facility] ENDOSCOPY;  Service: Endoscopy;  Laterality: N/A;  . DILATION AND CURETTAGE OF UTERUS    . ESOPHAGOGASTRODUODENOSCOPY (EGD) WITH  PROPOFOL N/A 03/19/2016   Procedure: ESOPHAGOGASTRODUODENOSCOPY (EGD) WITH PROPOFOL;  Surgeon: Manya Silvas, MD;  Location[facility] ENDOSCOPY;  Service: Endoscopy;  Laterality: N/A;  . TONSILLECTOMY      OB History    No data available       Home Medications    Prior to Admission medications   Medication Sig Start Date End Date Taking? Authorizing Provider  esomeprazole (NEXIUM) 40 MG capsule Take 40 mg by mouth daily at 12 noon.   Yes Historical Provider, MD  levothyroxine (SYNTHROID, LEVOTHROID) 50 MCG tablet Take 50 mcg by mouth daily.     Yes Historical Provider, MD  Multiple Vitamins-Minerals (MULTIVITAL) tablet Take 1 tablet by mouth daily.     Yes Historical Provider, MD  Omega-3 Fatty Acids (FISH OIL) 1000 MG CAPS Take by mouth daily.     Yes Historical Provider, MD  PROBIOTIC CAPS Take by mouth daily.     Yes Historical Provider, MD  simvastatin (ZOCOR) 20 MG tablet Take 1 tablet (20 mg total) by mouth daily. 02/06/16  Yes Satira Sark, MD  metoprolol succinate (TOPROL-XL) 25 MG 24 hr tablet Take 25 mg by mouth daily as needed.     Historical Provider, MD    Family History Family History  Problem Relation Age of Onset  . Stroke Father   . Alzheimer's disease Mother   . Dementia Mother   . Coronary artery disease Neg Hx     Social History Social History  Substance Use Topics  . Smoking status:  Never Smoker  . Smokeless tobacco: Never Used  . Alcohol use No     Allergies   Alendronate   Review of Systems Review of Systems   Physical Exam Updated Vital Signs BP (!) 173/74 (BP Location: Right Arm)   Pulse 65   Temp 97.9 F (36.6 C) (Oral)   Resp 18   Ht 5\' 5"  (1.651 m)   Wt 155 lb 3.3 oz (70.4 kg)   SpO2 97%   BMI 25.83 kg/m   Physical Exam  Constitutional: She is oriented to person, place, and time. She appears well-developed and well-nourished.  HENT:  Head: Normocephalic and atraumatic.  Eyes: Conjunctivae and EOM are normal.  Neck:  Normal range of motion.  Cardiovascular: Normal rate and regular rhythm.   Pulmonary/Chest: Effort normal. No stridor. No respiratory distress.  Abdominal: She exhibits no distension.  Musculoskeletal: She exhibits no edema or deformity.  Neurological: She is alert and oriented to person, place, and time. No cranial nerve deficit.  Skin: Skin is warm and dry. No erythema. No pallor.  Nursing note and vitals reviewed.    ED Treatments / Results  Labs (all labs ordered are listed, but only abnormal results are displayed) Labs Reviewed  BASIC METABOLIC PANEL  CBC  TROPONIN I  APTT  PROTIME-INR  MAGNESIUM  TSH  CBC  I-STAT TROPOININ, ED    EKG  EKG Interpretation  Date/Time:  Sunday September 20 2016 18:09:48 EST Ventricular Rate:  68 PR Interval:    QRS Duration: 89 QT Interval:  403 QTC Calculation: 429 R Axis:   -14 Text Interpretation:  Sinus rhythm ST depressions in II, aVF, V5 new from previous Confirmed by [facility] MD, Corene Cornea [phone]) on 09/20/2016 6:22:55 PM       Radiology Dg Chest 2 View  Result Date: 09/20/2016 CLINICAL DATA:  Patient has had intermitant chest pain for the past 2 weeks and has an appointment for her Dr, this month. Chest pain became much worse today. Non smoker with no other hx. EXAM: CHEST  2 VIEW COMPARISON:  01/28/2009.  CT, 02/24/2016. FINDINGS: Cardiac silhouette is normal in size and configuration. No mediastinal or hilar masses. No evidence of adenopathy. Lungs are hyperexpanded. There are mildly prominent bronchovascular markings with subtle peripheral lung base interstitial thickening. No lung consolidation or convincing pulmonary edema. No pleural effusion or pneumothorax. Skeletal structures are demineralized but intact. IMPRESSION: No acute cardiopulmonary disease. Electronically Signed   By: Lajean Manes M.D.   On: 09/20/2016 18:52    Procedures Procedures (including critical care time)  Medications Ordered in ED Medications    simvastatin (ZOCOR) tablet 20 mg (20 mg Oral Given 09/20/16 2147)  levothyroxine (SYNTHROID, LEVOTHROID) tablet 50 mcg (not administered)  acetaminophen (TYLENOL) tablet 650 mg (not administered)  ondansetron (ZOFRAN) injection 4 mg (not administered)  morphine 2 MG/ML injection 2 mg (not administered)  aspirin EC tablet 325 mg (325 mg Oral Not Given 09/20/16 2110)  enoxaparin (LOVENOX) injection 70 mg (70 mg Subcutaneous Given 09/20/16 2147)  aspirin tablet 325 mg (325 mg Oral Given 09/20/16 1914)  heparin bolus via infusion 4,000 Units (4,000 Units Intravenous Bolus from Bag 09/20/16 1925)     Initial Impression / Assessment and Plan / ED Course  I have reviewed the triage vital signs and the nursing notes.  Pertinent labs & imaging results that were available during my care of the patient were reviewed by me and considered in my medical decision making (see chart for details).  Clinical  Course     Concern for unstable angina so I started her on heparin. In and out of bigeminy while in ED and it seems that those pulses are not perfusing and leading to her episodes of bradycardia she is feeling.  Discussed case with cardiology down who is a patient can be managed here as they wouldn't cath her tonight anyway. So I discussed case with hospitalist and will admit for the same.  Final Clinical Impressions(s) / ED Diagnoses   Final diagnoses:  Hypothyroidism, unspecified type  Gastroesophageal reflux disease, esophagitis presence not specified    New Prescriptions Current Discharge Medication List       Merrily Pew, MD 09/20/16 [id number]    Merrily Pew, MD 09/20/16 [phone]

## 2016-09-20 NOTE — H&P (Signed)
History and Physical    Alejandra Singh [id number] DOB: 1941-06-15 DOA: 09/20/2016  PCP: [facility]  Patient coming from: home  Chief Complaint:   Chest pressure  HPI: Alejandra Singh is a 75 y.o. female with medical history significant of HTN, GERD, HLD comes in with several days of episodes of chest pressure associated with weakness, nausea and sob.  Pt reports it started 2 days ago and would just go away, and it has progressively become more common and longer duration.  Seems to improve with resting.  No radiation of the pressure.  Pt each time this would happen checked her vitals at home, her HR every time was low sometimes in the mid 30s and her BP was normal.  As her HR would increase and normalize  Her symptoms would improve.  Today the pressure lasted for over an hour, and again her HR was low in the 40s.  She has a h/o pSVT that happened once and has metoprolol to take prn for that, however she has never had to take the metoprolol.  She denies any illnesses, no fevers.  No swelling.  No cough.  No n/v/d.  She had stress testing with dr Domenic Polite a year ago which she reports was normal.  Pt being referred for admission for possible ACS.   Review of Systems: As per HPI otherwise 10 point review of systems negative.   Past Medical History:  Diagnosis Date  . FH: colonic polyps   . GERD (gastroesophageal reflux disease)   . Hyperlipidemia   . Hypothyroidism   . Iron deficiency anemia   . Osteoporosis   . 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 broken ankle    . COLONOSCOPY WITH PROPOFOL N/A 07/05/2015   Procedure: COLONOSCOPY WITH PROPOFOL;  Surgeon: Manya Silvas, MD;  Location: [facility] ENDOSCOPY;  Service: Endoscopy;  Laterality: N/A;  . DILATION AND CURETTAGE OF UTERUS    . ESOPHAGOGASTRODUODENOSCOPY (EGD) WITH PROPOFOL N/A 03/19/2016   Procedure:  ESOPHAGOGASTRODUODENOSCOPY (EGD) WITH PROPOFOL;  Surgeon: Manya Silvas, MD;  Location: [facility] ENDOSCOPY;  Service: Endoscopy;  Laterality: N/A;  . TONSILLECTOMY       reports that she has never smoked. She has never used smokeless tobacco. She reports that she does not drink alcohol or use drugs.  Allergies  Allergen Reactions  . Alendronate     Family History  Problem Relation Age of Onset  . Stroke Father   . Alzheimer's disease Mother   . Dementia Mother   . Coronary artery disease Neg Hx     Prior to Admission medications   Medication Sig Start Date End Date Taking? Authorizing Provider  esomeprazole (NEXIUM) 40 MG capsule Take 40 mg by mouth daily at 12 noon.   Yes Historical Provider, MD  levothyroxine (SYNTHROID, LEVOTHROID) 50 MCG tablet Take 50 mcg by mouth daily.     Yes Historical Provider, MD  Multiple Vitamins-Minerals (MULTIVITAL) tablet Take 1 tablet by mouth daily.     Yes Historical Provider, MD  Omega-3 Fatty Acids (FISH OIL) 1000 MG CAPS Take by mouth daily.     Yes Historical Provider, MD  PROBIOTIC CAPS Take by mouth daily.     Yes Historical Provider, MD  simvastatin (ZOCOR) 20 MG tablet Take 1 tablet (20 mg total) by mouth daily. 02/06/16  Yes Satira Sark, MD  metoprolol succinate (TOPROL-XL) 25 MG 24  hr tablet Take 25 mg by mouth daily as needed.     Historical Provider, MD    Physical Exam: Vitals:   09/20/16 1830 09/20/16 1900 09/20/16 1930 09/20/16 2000  BP: 195/82 159/72 (!) 138/125 160/77  Pulse: 69 63 (!) 59 (!) 59  Resp: 17 19 16 13   SpO2: 93% 96% 95% 93%  Weight:      Height:        Constitutional: NAD, calm, comfortable Vitals:   09/20/16 1830 09/20/16 1900 09/20/16 1930 09/20/16 2000  BP: 195/82 159/72 (!) 138/125 160/77  Pulse: 69 63 (!) 59 (!) 59  Resp: 17 19 16 13   SpO2: 93% 96% 95% 93%  Weight:      Height:       Eyes: PERRL, lids and conjunctivae normal ENMT: Mucous membranes are moist. Posterior pharynx clear of any  exudate or lesions.Normal dentition.  Neck: normal, supple, no masses, no thyromegaly Respiratory: clear to auscultation bilaterally, no wheezing, no crackles. Normal respiratory effort. No accessory muscle use.  Cardiovascular: Regular rate and rhythm, no murmurs / rubs / gallops. No extremity edema. 2+ pedal pulses. No carotid bruits.  Abdomen: no tenderness, no masses palpated. No hepatosplenomegaly. Bowel sounds positive.  Musculoskeletal: no clubbing / cyanosis. No joint deformity upper and lower extremities. Good ROM, no contractures. Normal muscle tone.  Skin: no rashes, lesions, ulcers. No induration Neurologic: CN 2-12 grossly intact. Sensation intact, DTR normal. Strength 5/5 in all 4.  Psychiatric: Normal judgment and insight. Alert and oriented x 3. Normal mood.    Labs on Admission: I have personally reviewed following labs and imaging studies  CBC:  Recent Labs Lab 09/20/16 1806  WBC 7.2  HGB 13.9  HCT 41.2  MCV 86.2  PLT 123XX123   Basic Metabolic Panel:  Recent Labs Lab 09/20/16 1806  NA 137  K 3.9  CL 106  CO2 23  GLUCOSE 96  BUN 13  CREATININE 0.62  CALCIUM 9.4   GFR: Estimated Creatinine Clearance: 55.5 mL/min (by C-G formula based on SCr of 0.62 mg/dL).  Coagulation Profile:  Recent Labs Lab 09/20/16 1806  INR 0.90   Cardiac Enzymes:  Recent Labs Lab 09/20/16 1806  TROPONINI <0.03   Radiological Exams on Admission: Dg Chest 2 View  Result Date: 09/20/2016 CLINICAL DATA:  Patient has had intermitant chest pain for the past 2 weeks and has an appointment for her Dr, this month. Chest pain became much worse today. Non smoker with no other hx. EXAM: CHEST  2 VIEW COMPARISON:  01/28/2009.  CT, 02/24/2016. FINDINGS: Cardiac silhouette is normal in size and configuration. No mediastinal or hilar masses. No evidence of adenopathy. Lungs are hyperexpanded. There are mildly prominent bronchovascular markings with subtle peripheral lung base interstitial  thickening. No lung consolidation or convincing pulmonary edema. No pleural effusion or pneumothorax. Skeletal structures are demineralized but intact. IMPRESSION: No acute cardiopulmonary disease. Electronically Signed   By: Lajean Manes M.D.   On: 09/20/2016 18:52    EKG: Independently reviewed. nsr , no acute changes  Assessment/Plan 75 yo female with chest pressure, fatigue, weakness, nausea, sob associated with episodes of bradycardia  Principal Problem:   Chest pain- her symptoms seem to correlate with bradycardia per pt report of monitoring her vitals over the last couple of days.  Serial troponin levels,  Will serial ekg also q 6 hours.  Obtain echo in the am.  Tele monitoring overnight.  May need to have home monitoring if nothing captured here while  in house.  Obtain cards consult to evaluate the need for further evaluation/pacemaker needs.  Active Problems:   Mixed hyperlipidemia- stable   Essential hypertension, benign- noted, stable   Paroxysmal supraventricular tachycardia (HCC)- occurred once in the past   Hypothyroidism- will check TSH level   GERD (gastroesophageal reflux disease)- noted    DVT prophylaxis: full lovenox Code Status:  full Family Communication: none  Disposition Plan: per day team Consults called:  Cardiology requested in EPIC for possible stress testing Admission status:  observation   Isahi Godwin A MD [facility]  If 7PM-7AM, please contact night-coverage [email or web address] Password [email or web address]  09/20/2016, 8:12 PM

## 2016-09-20 NOTE — ED Notes (Signed)
Report to Brandy RN

## 2016-09-20 NOTE — Progress Notes (Addendum)
ANTICOAGULATION CONSULT NOTE - Initial Consult  Pharmacy Consult for HEPARIN >> changed to Lovenox Indication: chest pain/ACS  Allergies  Allergen Reactions  . Alendronate    Patient Measurements: Height: 5\' 5"  (165.1 cm) Weight: 150 lb (68 kg) IBW/kg (Calculated) : 57 HEPARIN DW (KG): 68  Vital Signs: BP: 195/82 (12/03 1830) Pulse Rate: 69 (12/03 1830)  Labs:  Recent Labs  09/20/16 1806  HGB 13.9  HCT 41.2  PLT 275   CrCl cannot be calculated (Patient's most recent lab result is older than the maximum 21 days allowed.).  Medical History: Past Medical History:  Diagnosis Date  . FH: colonic polyps   . GERD (gastroesophageal reflux disease)   . Hyperlipidemia   . Hypothyroidism   . Iron deficiency anemia   . Osteoporosis   . 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  Heparin initially started for ACS >> asked to change to Lovenox. Renal fxn OK. Heparin 4000 units bolus was given ~ 19:35  Goal of Therapy:  Heparin level 0.3-0.7 units/ml Monitor platelets by anticoagulation protocol: Yes   Plan:  Lovenox 1mg /Kg SQ q12hrs Monitor CBC, s/sx bleeding complications  Hart Robinsons A 09/20/2016,7:05 PM

## 2016-09-21 ENCOUNTER — Other Ambulatory Visit (HOSPITAL_COMMUNITY): Payer: Medicare Other

## 2016-09-21 DIAGNOSIS — I493 Ventricular premature depolarization: Secondary | ICD-10-CM

## 2016-09-21 DIAGNOSIS — I200 Unstable angina: Secondary | ICD-10-CM | POA: Diagnosis not present

## 2016-09-21 DIAGNOSIS — I471 Supraventricular tachycardia: Secondary | ICD-10-CM

## 2016-09-21 DIAGNOSIS — R001 Bradycardia, unspecified: Secondary | ICD-10-CM | POA: Diagnosis present

## 2016-09-21 DIAGNOSIS — E782 Mixed hyperlipidemia: Secondary | ICD-10-CM | POA: Diagnosis not present

## 2016-09-21 DIAGNOSIS — I2584 Coronary atherosclerosis due to calcified coronary lesion: Secondary | ICD-10-CM | POA: Diagnosis not present

## 2016-09-21 DIAGNOSIS — I25110 Atherosclerotic heart disease of native coronary artery with unstable angina pectoris: Secondary | ICD-10-CM | POA: Diagnosis not present

## 2016-09-21 DIAGNOSIS — I10 Essential (primary) hypertension: Secondary | ICD-10-CM | POA: Diagnosis not present

## 2016-09-21 LAB — TROPONIN I: Troponin I: <0.03 ng/mL (ref <0.03)

## 2016-09-21 LAB — BASIC METABOLIC PANEL
Anion gap: 8 (ref 5–15)
BUN: 12 mg/dL (ref 6–20)
CHLORIDE: 107 mmol/L (ref 101–111)
CO2: 24 mmol/L (ref 22–32)
CREATININE: 0.68 mg/dL (ref 0.44–1.00)
Calcium: 9.2 mg/dL (ref 8.9–10.3)
GFR calc non Af Amer: >60 mL/min (ref >60)
GLUCOSE: 93 mg/dL (ref 65–99)
Potassium: 3.6 mmol/L (ref 3.5–5.1)
Sodium: 139 mmol/L (ref 135–145)

## 2016-09-21 LAB — PROTIME-INR
INR: 1.04
Prothrombin Time: 13.6 seconds (ref 11.4–15.2)

## 2016-09-21 LAB — CBC
HCT: 40.1 % (ref 36.0–46.0)
Hemoglobin: 13.3 g/dL (ref 12.0–15.0)
MCH: 28.8 pg (ref 26.0–34.0)
MCHC: 33.2 g/dL (ref 30.0–36.0)
MCV: 86.8 fL (ref 78.0–100.0)
PLATELETS: 247 10*3/uL (ref 150–400)
RBC: 4.62 MIL/uL (ref 3.87–5.11)
RDW: 13.8 % (ref 11.5–15.5)
WBC: 6.9 10*3/uL (ref 4.0–10.5)

## 2016-09-21 MED ORDER — SODIUM CHLORIDE 0.9% FLUSH: 3.0000 mL | INTRAVENOUS | Status: DC | PRN

## 2016-09-21 MED ORDER — SODIUM CHLORIDE 0.9 % IV SOLN
INTRAVENOUS | Status: DC
  Administered 2016-09-22: 06:00:00 via INTRAVENOUS

## 2016-09-21 MED ORDER — ASPIRIN 81 MG PO CHEW
81.0000 mg | CHEWABLE_TABLET | ORAL | Status: AC
  Administered 2016-09-22: 81 mg via ORAL
  Filled 2016-09-21: qty 1

## 2016-09-21 MED ORDER — SODIUM CHLORIDE 0.9 % IV SOLN: 250.0000 mL | INTRAVENOUS | Status: DC | PRN

## 2016-09-21 MED ORDER — SODIUM CHLORIDE 0.9% FLUSH
3.0000 mL | Freq: Two times a day (BID) | INTRAVENOUS | Status: DC
  Administered 2016-09-21: 3 mL via INTRAVENOUS

## 2016-09-21 NOTE — Progress Notes (Signed)
Patient briefly seen and examined. Database reviewed. Seen by Dr. Domenic Polite. Symptoms concerning for unstable angina and decision has been made to transfer to [facility] for cardiac cath under cardiology service. Will sign off. Please call [facility] back if any questions.  Domingo Mend, MD [facility] Pager: [phone]

## 2016-09-21 NOTE — Care Management Obs Status (Signed)
[place] NOTIFICATION   Patient Details  Name: Alejandra Singh MRN: [id number] Date of Birth: June 03, 1941   Medicare Observation Status Notification Given:  Yes    Sherald Barge, RN 09/21/2016, 3:03 PM

## 2016-09-21 NOTE — Consult Note (Signed)
Requesting provider:  Dr. Thersa Salt Primary cardiologist: Dr. Satira Sark Consulting cardiologist: Dr. Satira Sark  Reason for consultation: Chest pressure, reported bradycardia  Clinical Summary Alejandra Singh is a 75 y.o.female well-known to me with past medical history outlined below. She presents reporting progressive fatigue, confirmed by family members, also recurring chest pressure and tightness radiating up into her neck. She has noted increasing frequency and intensity of the symptoms. She also reports that when she checks her blood pressure it has been more elevated than normal and she has also been bradycardic with a blood pressure cuff indicating heart rates in the "30s and 40s."  She has had no dizziness or syncope.  Under observation on telemetry she has had no episodes of bradycardia or pauses. She has however had frequent ventricular ectopy with runs of ventricular bigeminy. I suspect that her home blood pressure cuff was undercounting her actual heart rate in the setting of PVCs.  She does not have any history of obstructive CAD or myocardial infarction. Prior chest CT imaging in May of this year did demonstrate coronary artery calcifications. Exercise echocardiogram from November 2016 showed no focal exercise induced wall motion abnormalities although ECG changes were abnormal with hypertensive response.  She has history of PSVT, no obvious recurring events recently. She uses Toprol-XL only as needed.  Allergies  Allergen Reactions  . Alendronate     Medications Scheduled Medications: . aspirin EC  325 mg Oral Daily  . enoxaparin (LOVENOX) injection  70 mg Subcutaneous Q12H  . levothyroxine  50 mcg Oral QAC breakfast  . simvastatin  20 mg Oral Daily    PRN Medications: acetaminophen, morphine injection, ondansetron (ZOFRAN) IV   Past Medical History:  Diagnosis Date  . FH: colonic polyps   . GERD (gastroesophageal reflux disease)     . Hyperlipidemia   . Hypothyroidism   . Iron deficiency anemia   . Osteoporosis   . 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 broken ankle    . COLONOSCOPY WITH PROPOFOL N/A 07/05/2015   Procedure: COLONOSCOPY WITH PROPOFOL;  Surgeon: Manya Silvas, MD;  Location: [facility] ENDOSCOPY;  Service: Endoscopy;  Laterality: N/A;  . DILATION AND CURETTAGE OF UTERUS    . ESOPHAGOGASTRODUODENOSCOPY (EGD) WITH PROPOFOL N/A 03/19/2016   Procedure: ESOPHAGOGASTRODUODENOSCOPY (EGD) WITH PROPOFOL;  Surgeon: Manya Silvas, MD;  Location: [facility] ENDOSCOPY;  Service: Endoscopy;  Laterality: N/A;  . TONSILLECTOMY      Family History  Problem Relation Age of Onset  . Stroke Father   . Alzheimer's disease Mother   . Dementia Mother   . Coronary artery disease Neg Hx     Social History Ms. Waltman reports that she has never smoked. She has never used smokeless tobacco. Ms. Auriemma reports that she does not drink alcohol.  Review of Systems Complete review of systems negative except as otherwise outlined in the clinical summary and also the following. Fatigue.  Physical Examination Blood pressure 131/68, pulse 98, temperature 97.8 F (36.6 C), temperature source Oral, resp. rate 18, height 5\' 5"  (1.651 m), weight 155 lb 3.3 oz (70.4 kg), SpO2 97 %.  Intake/Output Summary (Last 24 hours) at 09/21/16 1045 Last data filed at 09/20/16 2007  Gross per 24 hour  Intake             1000 ml  Output  0 ml  Net             1000 ml   Telemetry: Sinus rhythm with frequent PVCs. Runs of ventricular bigeminy noted.  Gen.: Normally nourished appearing woman in no distress. HEENT: Conjunctiva and lids normal, oropharynx clear. Neck: Supple, no elevated JVP or carotid bruits, no thyromegaly. Lungs: Clear to auscultation, nonlabored breathing at rest. Cardiac: Regular rate and rhythm, no S3 or significant  systolic murmur, no pericardial rub. Abdomen: Soft, nontender, bowel sounds present, no guarding or rebound. Extremities: No pitting edema, distal pulses 2+. Skin: Warm and dry. Musculoskeletal: No kyphosis. Neuropsychiatric: Alert and oriented x3, affect grossly appropriate.   Lab Results  Basic Metabolic Panel:  Recent Labs Lab 09/20/16 1806  NA 137  K 3.9  CL 106  CO2 23  GLUCOSE 96  BUN 13  CREATININE 0.62  CALCIUM 9.4  MG 2.3    CBC:  Recent Labs Lab 09/20/16 1806 09/21/16 0449  WBC 7.2 6.9  HGB 13.9 13.3  HCT 41.2 40.1  MCV 86.2 86.8  PLT 275 247    Cardiac Enzymes:  Recent Labs Lab 09/20/16 1806  TROPONINI <0.03    ECG I personally reviewed the tracing from 09/21/2016 which shows sinus rhythm with increased full due to nonspecific ST-T changes.  Imaging Chest x-ray 09/20/2016: FINDINGS: Cardiac silhouette is normal in size and configuration. No mediastinal or hilar masses. No evidence of adenopathy.  Lungs are hyperexpanded. There are mildly prominent bronchovascular markings with subtle peripheral lung base interstitial thickening. No lung consolidation or convincing pulmonary edema.  No pleural effusion or pneumothorax.  Skeletal structures are demineralized but intact.  IMPRESSION: No acute cardiopulmonary disease.  Impression  1. Symptoms concerning for unstable angina associated with exertional fatigue over the last 3 weeks at least. Patient reports increasing frequency and intensity of chest pressure radiating into the neck. Documented bradycardia at this point is suspected to be undercounting of effective heart rate with frequent PVCs by home blood pressure cuff. Telemetry so far does not show any significant bradyarrhythmias or pauses. Initial troponin I is negative. ECG shows nonspecific ST-T changes.  2. Prior documented coronary artery calcifications by chest CT imaging. Exercise echocardiogram from November of last year did  not demonstrate any echocardiographic evidence of ischemia although ECG changes were noted with hypertensive response.  3. History of PSVT, this has been relatively quiescent. She has Toprol-XL to use as needed.  4. Hyperlipidemia, on statin therapy.  Recommendations  Discussed with patient, husband, and daughter. In light of progressive symptoms we will plan to transfer her to [facility] in anticipation of a diagnostic cardiac catheterization to clearly assess coronary anatomy for any revascularization options. Continue telemetry and observe heart rhythm, although she has not had any significant bradycardia or pauses documented. Continue aspirin, Lovenox, and statin therapy. May actually need to put her on a standing beta blocker with frequent PVCs.  Satira Sark, M.D., F.A.C.C.

## 2016-09-21 NOTE — Progress Notes (Signed)
**Note De-identified Alejandra Singh Obfuscation** EKG complete:abnormal results reported to RN and placed in patient chart 

## 2016-09-21 NOTE — Progress Notes (Signed)
Patient arrived on the unit from [facility], assessment completed see flowsheet, patient oriented to room and staff, placed on tele ccmd notified, call bell within reach will continue to monitor

## 2016-09-21 NOTE — Progress Notes (Signed)
Report called to [place].

## 2016-09-21 NOTE — Progress Notes (Signed)
Transfered via care link to [facility] 2w 3

## 2016-09-22 ENCOUNTER — Other Ambulatory Visit: Payer: Self-pay | Admitting: Cardiology

## 2016-09-22 ENCOUNTER — Encounter: Payer: Self-pay | Admitting: Cardiology

## 2016-09-22 ENCOUNTER — Encounter (HOSPITAL_COMMUNITY): Payer: Self-pay | Admitting: Cardiology

## 2016-09-22 ENCOUNTER — Encounter (HOSPITAL_COMMUNITY)
Admission: EM | Disposition: A | Discharge status: Home / Self Care | Payer: Self-pay | Source: Home / Self Care | Attending: Emergency Medicine

## 2016-09-22 DIAGNOSIS — I10 Essential (primary) hypertension: Secondary | ICD-10-CM | POA: Diagnosis not present

## 2016-09-22 DIAGNOSIS — I200 Unstable angina: Secondary | ICD-10-CM | POA: Diagnosis not present

## 2016-09-22 DIAGNOSIS — I25110 Atherosclerotic heart disease of native coronary artery with unstable angina pectoris: Secondary | ICD-10-CM

## 2016-09-22 DIAGNOSIS — E782 Mixed hyperlipidemia: Secondary | ICD-10-CM | POA: Diagnosis not present

## 2016-09-22 DIAGNOSIS — I2584 Coronary atherosclerosis due to calcified coronary lesion: Secondary | ICD-10-CM | POA: Diagnosis not present

## 2016-09-22 DIAGNOSIS — I471 Supraventricular tachycardia: Secondary | ICD-10-CM

## 2016-09-22 HISTORY — PX: CARDIAC CATHETERIZATION: SHX172

## 2016-09-22 SURGERY — LEFT HEART CATH AND CORONARY ANGIOGRAPHY

## 2016-09-22 MED ORDER — MIDAZOLAM HCL 2 MG/2ML IJ SOLN
INTRAMUSCULAR | Status: AC
  Filled 2016-09-22: qty 2

## 2016-09-22 MED ORDER — VERAPAMIL HCL 2.5 MG/ML IV SOLN
INTRAVENOUS | Status: DC | PRN
  Administered 2016-09-22: 10 mL via INTRA_ARTERIAL

## 2016-09-22 MED ORDER — IOPAMIDOL (ISOVUE-370) INJECTION 76%
INTRAVENOUS | Status: AC
  Filled 2016-09-22: qty 100

## 2016-09-22 MED ORDER — SODIUM CHLORIDE 0.9% FLUSH: 3.0000 mL | Freq: Two times a day (BID) | INTRAVENOUS | Status: DC

## 2016-09-22 MED ORDER — HEPARIN (PORCINE) IN NACL 2-0.9 UNIT/ML-% IJ SOLN
INTRAMUSCULAR | Status: AC
  Filled 2016-09-22: qty 1000

## 2016-09-22 MED ORDER — HEPARIN SODIUM (PORCINE) 1000 UNIT/ML IJ SOLN
INTRAMUSCULAR | Status: DC | PRN
  Administered 2016-09-22: 3500 [IU] via INTRAVENOUS

## 2016-09-22 MED ORDER — HEPARIN SODIUM (PORCINE) 1000 UNIT/ML IJ SOLN
INTRAMUSCULAR | Status: AC
  Filled 2016-09-22: qty 1

## 2016-09-22 MED ORDER — MIDAZOLAM HCL 2 MG/2ML IJ SOLN
INTRAMUSCULAR | Status: DC | PRN
  Administered 2016-09-22: 2 mg via INTRAVENOUS

## 2016-09-22 MED ORDER — METOPROLOL TARTRATE 25 MG PO TABS: 12.5000 mg | ORAL_TABLET | Freq: Two times a day (BID) | ORAL | 12 refills | Status: DC

## 2016-09-22 MED ORDER — LEVOTHYROXINE SODIUM 50 MCG PO TABS: 50.0000 ug | ORAL_TABLET | Freq: Every day | ORAL | Status: DC

## 2016-09-22 MED ORDER — SODIUM CHLORIDE 0.9% FLUSH: 3.0000 mL | INTRAVENOUS | Status: DC | PRN

## 2016-09-22 MED ORDER — LIDOCAINE HCL (PF) 1 % IJ SOLN
INTRAMUSCULAR | Status: DC | PRN
  Administered 2016-09-22: 2 mL

## 2016-09-22 MED ORDER — VERAPAMIL HCL 2.5 MG/ML IV SOLN
INTRAVENOUS | Status: AC
  Filled 2016-09-22: qty 2

## 2016-09-22 MED ORDER — FENTANYL CITRATE (PF) 100 MCG/2ML IJ SOLN
INTRAMUSCULAR | Status: AC
  Filled 2016-09-22: qty 2

## 2016-09-22 MED ORDER — FENTANYL CITRATE (PF) 100 MCG/2ML IJ SOLN
INTRAMUSCULAR | Status: DC | PRN
  Administered 2016-09-22: 25 ug via INTRAVENOUS

## 2016-09-22 MED ORDER — SODIUM CHLORIDE 0.9 % WEIGHT BASED INFUSION: 1.0000 mL/kg/h | INTRAVENOUS | Status: DC

## 2016-09-22 MED ORDER — HEPARIN (PORCINE) IN NACL 2-0.9 UNIT/ML-% IJ SOLN
INTRAMUSCULAR | Status: DC | PRN
  Administered 2016-09-22: 1000 mL

## 2016-09-22 MED ORDER — IOPAMIDOL (ISOVUE-370) INJECTION 76%
INTRAVENOUS | Status: DC | PRN
  Administered 2016-09-22: 35 mL via INTRAVENOUS

## 2016-09-22 MED ORDER — SODIUM CHLORIDE 0.9 % IV SOLN: 250.0000 mL | INTRAVENOUS | Status: DC | PRN

## 2016-09-22 MED ORDER — LIDOCAINE HCL (PF) 1 % IJ SOLN
INTRAMUSCULAR | Status: AC
  Filled 2016-09-22: qty 30

## 2016-09-22 SURGICAL SUPPLY — 9 items
CATH OPTITORQUE TIG 4.0 5F (CATHETERS) ×3 IMPLANT
DEVICE RAD COMP TR BAND LRG (VASCULAR PRODUCTS) ×3 IMPLANT
GLIDESHEATH SLEND A-KIT 6F 22G (SHEATH) ×3 IMPLANT
GUIDEWIRE INQWIRE 1.5J.035X260 (WIRE) ×2 IMPLANT
INQWIRE 1.5J .035X260CM (WIRE) ×6
KIT HEART LEFT (KITS) ×3 IMPLANT
PACK CARDIAC CATHETERIZATION (CUSTOM PROCEDURE TRAY) ×3 IMPLANT
TRANSDUCER W/STOPCOCK (MISCELLANEOUS) ×3 IMPLANT
TUBING CIL FLEX 10 FLL-RA (TUBING) ×3 IMPLANT

## 2016-09-22 NOTE — Progress Notes (Signed)
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 No problems at radial access site. Feels well.  Inpatient Medications    Scheduled Meds: . levothyroxine  50 mcg Oral QHS  . simvastatin  20 mg Oral Daily  . sodium chloride flush  3 mL Intravenous Q12H   Continuous Infusions: . sodium chloride     PRN Meds: sodium chloride, acetaminophen, morphine injection, ondansetron (ZOFRAN) IV, sodium chloride flush   Vital Signs    Vitals:   09/22/16 1130 09/22/16 1144 09/22/16 1200 09/22/16 1226  BP: 139/64 (!) 142/64 136/66 137/66  Pulse: 65 60 64 66  Resp:      Temp:      TempSrc:      SpO2: 97% 97% 98% 98%  Weight:      Height:        Intake/Output Summary (Last 24 hours) at 09/22/16 1404 Last data filed at 09/22/16 0800  Gross per 24 hour  Intake                0 ml  Output                0 ml  Net                0 ml   Filed Weights   09/20/16 1805 09/20/16 2048 09/22/16 0526  Weight: 150 lb (68 kg) 155 lb 3.3 oz (70.4 kg) 149 lb (67.6 kg)    Physical Exam   GEN: Well nourished, well developed, in no acute distress.  HEENT: Grossly normal.  Neck: Supple, no JVD, carotid bruits, or masses. Cardiac: RRR, no murmurs, rubs, or gallops. No clubbing, cyanosis, edema.  Radials/DP/PT 2+ and equal bilaterally.  Respiratory:  Respirations regular and unlabored, clear to auscultation bilaterally. GI: Soft, nontender, nondistended, BS + x 4. MS: no deformity or atrophy. Skin: warm and dry, no rash. Neuro:  Strength and sensation are intact. Psych: AAOx3.   Normal affect.  Labs    CBC  Recent Labs  09/20/16 1806 09/21/16 0449  WBC 7.2 6.9  HGB 13.9 13.3  HCT 41.2 40.1  MCV 86.2 86.8  PLT 275 [id number]   Basic Metabolic Panel  Recent Labs  09/20/16 1806 09/21/16 1842  NA 137 139  K 3.9 3.6  CL 106 107  CO2 23 24  GLUCOSE 96 93  BUN 13 12  CREATININE 0.62 0.68  CALCIUM 9.4 9.2  MG 2.3  --    \Cardiac Enzymes  Recent Labs  09/20/16 1806 09/21/16 1117  TROPONINI <0.03 <0.03   \Thyroid Function Tests  Recent Labs  09/20/16 1806  TSH 3.144    Telemetry    NSR - Personally Reviewed  ECG    NSR, NT, no preexcitation - Personally Reviewed  Radiology    Dg Chest 2 View  Result Date: 09/20/2016 CLINICAL DATA:  Patient has had intermitant chest pain for the past 2 weeks and has an appointment for her Dr, this month. Chest pain became much worse today. Non smoker with no other hx. EXAM: CHEST  2 VIEW  COMPARISON:  01/28/2009.  CT, 02/24/2016. FINDINGS: Cardiac silhouette is normal in size and configuration. No mediastinal or hilar masses. No evidence of adenopathy. Lungs are hyperexpanded. There are mildly prominent bronchovascular markings with subtle peripheral lung base interstitial thickening. No lung consolidation or convincing pulmonary edema. No pleural effusion or pneumothorax. Skeletal structures are demineralized but intact. IMPRESSION: No acute cardiopulmonary disease. Electronically Signed   By: Lajean Manes M.D.   On: 09/20/2016 18:52    Cardiac Studies   Cath 09/22/16  The left ventricular systolic function is normal. The left ventricular ejection fraction is 55-65% by visual estimate.  LV end diastolic pressure is normal.  Dist LAD lesion, 50 %stenosed - on exam, with a small bridging segment with kinking. Otherwise normal coronary arteries.   Angiographically, I do not see any significant coronary disease expanded the patient's symptoms. The distal LAD lesion is likely not significant, and there is  at a bend with kinking suggestive of mild bridging. Not a good PCI target.  Patient also had a catheter-induced SVT and was hypertensive during the case. Suggested maybe her symptoms are related to hypertension and SVT related microvascular ischemia.  Plan: Return to nursing unit for ongoing care and evaluation on telemetry. Consider event monitor. 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  Assessment & Plan    She was not aware of rapid palpitation during the SVT event in the cath lab, raising the possibility that her chest pain could be related to SVT without awareness of tachycardia. Start empirical daily very low dose beta blocker. No bradycardia detected during her hospital stay. Home BP cuff has reported HR 38, unclear if this was accurate. Keep f/u scheduled 12/19 with Dr. Domenic Polite. Consider use of a smartphone app that can track electrical rhythm, such as the [employer] device. She does not have a smartphone but may decide to use a tablet from her daughter. It would be useful to correlate her rhythm with her symptoms. DC after post cath rest period.  Signed, Sanda Klein, MD  09/22/2016, 2:04 PM

## 2016-09-22 NOTE — H&P (View-Only) (Signed)
Requesting provider:  Dr. Thersa Salt Primary cardiologist: Dr. Satira Sark Consulting cardiologist: Dr. Satira Sark  Reason for consultation: Chest pressure, reported bradycardia  Clinical Summary Alejandra Singh is a 75 y.o.female well-known to me with past medical history outlined below. She presents reporting progressive fatigue, confirmed by family members, also recurring chest pressure and tightness radiating up into her neck. She has noted increasing frequency and intensity of the symptoms. She also reports that when she checks her blood pressure it has been more elevated than normal and she has also been bradycardic with a blood pressure cuff indicating heart rates in the "30s and 40s."  She has had no dizziness or syncope.  Under observation on telemetry she has had no episodes of bradycardia or pauses. She has however had frequent ventricular ectopy with runs of ventricular bigeminy. I suspect that her home blood pressure cuff was undercounting her actual heart rate in the setting of PVCs.  She does not have any history of obstructive CAD or myocardial infarction. Prior chest CT imaging in May of this year did demonstrate coronary artery calcifications. Exercise echocardiogram from November 2016 showed no focal exercise induced wall motion abnormalities although ECG changes were abnormal with hypertensive response.  She has history of PSVT, no obvious recurring events recently. She uses Toprol-XL only as needed.  Allergies  Allergen Reactions  . Alendronate     Medications Scheduled Medications: . aspirin EC  325 mg Oral Daily  . enoxaparin (LOVENOX) injection  70 mg Subcutaneous Q12H  . levothyroxine  50 mcg Oral QAC breakfast  . simvastatin  20 mg Oral Daily    PRN Medications: acetaminophen, morphine injection, ondansetron (ZOFRAN) IV   Past Medical History:  Diagnosis Date  . FH: colonic polyps   . GERD (gastroesophageal reflux disease)     . Hyperlipidemia   . Hypothyroidism   . Iron deficiency anemia   . Osteoporosis   . 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 broken ankle    . COLONOSCOPY WITH PROPOFOL N/A 07/05/2015   Procedure: COLONOSCOPY WITH PROPOFOL;  Surgeon: Manya Silvas, MD;  Location: [facility] ENDOSCOPY;  Service: Endoscopy;  Laterality: N/A;  . DILATION AND CURETTAGE OF UTERUS    . ESOPHAGOGASTRODUODENOSCOPY (EGD) WITH PROPOFOL N/A 03/19/2016   Procedure: ESOPHAGOGASTRODUODENOSCOPY (EGD) WITH PROPOFOL;  Surgeon: Manya Silvas, MD;  Location: [facility] ENDOSCOPY;  Service: Endoscopy;  Laterality: N/A;  . TONSILLECTOMY      Family History  Problem Relation Age of Onset  . Stroke Father   . Alzheimer's disease Mother   . Dementia Mother   . Coronary artery disease Neg Hx     Social History Alejandra Singh reports that she has never smoked. She has never used smokeless tobacco. Alejandra Singh reports that she does not drink alcohol.  Review of Systems Complete review of systems negative except as otherwise outlined in the clinical summary and also the following. Fatigue.  Physical Examination Blood pressure 131/68, pulse 98, temperature 97.8 F (36.6 C), temperature source Oral, resp. rate 18, height 5\' 5"  (1.651 m), weight 155 lb 3.3 oz (70.4 kg), SpO2 97 %.  Intake/Output Summary (Last 24 hours) at 09/21/16 1045 Last data filed at 09/20/16 2007  Gross per 24 hour  Intake             1000 ml  Output  0 ml  Net             1000 ml   Telemetry: Sinus rhythm with frequent PVCs. Runs of ventricular bigeminy noted.  Gen.: Normally nourished appearing woman in no distress. HEENT: Conjunctiva and lids normal, oropharynx clear. Neck: Supple, no elevated JVP or carotid bruits, no thyromegaly. Lungs: Clear to auscultation, nonlabored breathing at rest. Cardiac: Regular rate and rhythm, no S3 or significant  systolic murmur, no pericardial rub. Abdomen: Soft, nontender, bowel sounds present, no guarding or rebound. Extremities: No pitting edema, distal pulses 2+. Skin: Warm and dry. Musculoskeletal: No kyphosis. Neuropsychiatric: Alert and oriented x3, affect grossly appropriate.   Lab Results  Basic Metabolic Panel:  Recent Labs Lab 09/20/16 1806  NA 137  K 3.9  CL 106  CO2 23  GLUCOSE 96  BUN 13  CREATININE 0.62  CALCIUM 9.4  MG 2.3    CBC:  Recent Labs Lab 09/20/16 1806 09/21/16 0449  WBC 7.2 6.9  HGB 13.9 13.3  HCT 41.2 40.1  MCV 86.2 86.8  PLT 275 247    Cardiac Enzymes:  Recent Labs Lab 09/20/16 1806  TROPONINI <0.03    ECG I personally reviewed the tracing from 09/21/2016 which shows sinus rhythm with increased full due to nonspecific ST-T changes.  Imaging Chest x-ray 09/20/2016: FINDINGS: Cardiac silhouette is normal in size and configuration. No mediastinal or hilar masses. No evidence of adenopathy.  Lungs are hyperexpanded. There are mildly prominent bronchovascular markings with subtle peripheral lung base interstitial thickening. No lung consolidation or convincing pulmonary edema.  No pleural effusion or pneumothorax.  Skeletal structures are demineralized but intact.  IMPRESSION: No acute cardiopulmonary disease.  Impression  1. Symptoms concerning for unstable angina associated with exertional fatigue over the last 3 weeks at least. Patient reports increasing frequency and intensity of chest pressure radiating into the neck. Documented bradycardia at this point is suspected to be undercounting of effective heart rate with frequent PVCs by home blood pressure cuff. Telemetry so far does not show any significant bradyarrhythmias or pauses. Initial troponin I is negative. ECG shows nonspecific ST-T changes.  2. Prior documented coronary artery calcifications by chest CT imaging. Exercise echocardiogram from November of last year did  not demonstrate any echocardiographic evidence of ischemia although ECG changes were noted with hypertensive response.  3. History of PSVT, this has been relatively quiescent. She has Toprol-XL to use as needed.  4. Hyperlipidemia, on statin therapy.  Recommendations  Discussed with patient, husband, and daughter. In light of progressive symptoms we will plan to transfer her to [facility] in anticipation of a diagnostic cardiac catheterization to clearly assess coronary anatomy for any revascularization options. Continue telemetry and observe heart rhythm, although she has not had any significant bradycardia or pauses documented. Continue aspirin, Lovenox, and statin therapy. May actually need to put her on a standing beta blocker with frequent PVCs.  Satira Sark, M.D., F.A.C.C.

## 2016-09-22 NOTE — Interval H&P Note (Signed)
History and Physical Interval Note:  09/22/2016 10:09 AM  Alejandra Singh  has presented today for surgery, with the diagnosis of cp-concerning for progressive angin & Bradycardia.   The various methods of treatment have been discussed with the patient and family. After consideration of risks, benefits and other options for treatment, the patient has consented to  Procedure(s): Left Heart Cath and Coronary Angiography (N/A) with possible Percutaneous Coronary Intervention as a surgical intervention .  The patient's history has been reviewed, patient examined, no change in status, stable for surgery.  I have reviewed the patient's chart and labs.  Questions were answered to the patient's satisfaction.    Cath Lab Visit (complete for each Cath Lab visit)  Clinical Evaluation Leading to the Procedure:   ACS: Yes.    Non-ACS:    Anginal Classification: CCS III  Anti-ischemic medical therapy: Minimal Therapy (1 class of medications)  Non-Invasive Test Results: No non-invasive testing performed  Prior CABG: No previous CABG    Glenetta Hew

## 2016-09-22 NOTE — Discharge Summary (Signed)
Discharge Summary    Patient ID: MONYA DEADRICK,  MRN: [id number], DOB/AGE: Jun 22, 1941 75 y.o.  Admit date: 09/20/2016 Discharge date: 09/22/2016  Primary Care Provider: [facility] Primary Cardiologist: Dr. 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 Alendronate     Diagnostic Studies/Procedures    Left Heart Cath and Coronary Angiography 09/22/16  The left ventricular systolic function is normal. The left ventricular ejection fraction is 55-65% by visual estimate.  LV end diastolic pressure is normal.  Dist LAD lesion, 50 %stenosed - on exam, with a small bridging segment with kinking. Otherwise normal coronary arteries.   Angiographically, I do not see any significant coronary disease expanded the patient's symptoms. The distal LAD lesion is likely not significant, and there is at a bend with kinking suggestive of mild bridging. Not a good PCI target.  Patient also had a catheter-induced SVT and was hypertensive during the case. Suggested maybe her symptoms are related to hypertension and SVT related microvascular ischemia.  Plan: Return to nursing unit for ongoing care and evaluation on telemetry. Consider event monitor. Likely non-macrovascular coronary disease-related chest pain  _____________   History of Present Illness     Ms. Zuch is a 74 y.o.female well-known to me with past medical history outlined below. She presents reporting progressive fatigue, confirmed by family members, also recurring chest pressure and tightness radiating up into her neck. She has noted increasing frequency and  intensity of the symptoms. She also reports that when she checks her blood pressure it has been more elevated than normal and she has also been bradycardic with a blood pressure cuff indicating heart rates in the "30s and 40s."  She has had no dizziness or syncope.  Under observation on telemetry she has had no episodes of bradycardia or pauses. She has however had frequent ventricular ectopy with runs of ventricular bigeminy. I suspect that her home blood pressure cuff was undercounting her actual heart rate in the setting of PVCs.  She does not have any history of obstructive CAD or myocardial infarction. Prior chest CT imaging in May of this year did demonstrate coronary artery calcifications. Exercise echocardiogram from November 2016 showed no focal exercise induced wall motion abnormalities although ECG changes were abnormal with hypertensive response.  She has history of PSVT, no obvious recurring events recently. She uses Toprol-XL only as needed.   Hospital Course     She had a left heart cath on 09/22/16 that showed normal LVEF, a distal LAD lesion that was 50% stenosed that was not significant.   She had catheter induced SVT and was hypertensive during the case. She was started on 12.5mg  of metoprolol daily.   She will be set up for an outpatient monitor as she reported some home BP monitor readings with a low HR. She was seen today by Dr. Sallyanne Kuster and deemed suitable for discharge.   Discharge Vitals Blood pressure 131/62, pulse 69, temperature 97.5 F (36.4 C), temperature source Oral, resp. rate 18, height 5\' 5"  (1.651 m), weight 149 lb (67.6 kg), SpO2 97 %.  Filed Weights   09/20/16 1805 09/20/16 2048 09/22/16 0526  Weight:  150 lb (68 kg) 155 lb 3.3 oz (70.4 kg) 149 lb (67.6 kg)    Labs & Radiologic Studies     CBC  Recent Labs  09/20/16 1806 09/21/16 0449  WBC 7.2 6.9  HGB 13.9 13.3  HCT 41.2 40.1  MCV 86.2 86.8  PLT 275 [id number]   Basic Metabolic Panel  Recent  Labs  09/20/16 1806 09/21/16 1842  NA 137 139  K 3.9 3.6  CL 106 107  CO2 23 24  GLUCOSE 96 93  BUN 13 12  CREATININE 0.62 0.68  CALCIUM 9.4 9.2  MG 2.3  --    Cardiac Enzymes  Recent Labs  09/20/16 1806 09/21/16 1117  TROPONINI <0.03 <0.03   Thyroid Function Tests  Recent Labs  09/20/16 1806  TSH 3.144    Dg Chest 2 View  Result Date: 09/20/2016 CLINICAL DATA:  Patient has had intermitant chest pain for the past 2 weeks and has an appointment for her Dr, this month. Chest pain became much worse today. Non smoker with no other hx. EXAM: CHEST  2 VIEW COMPARISON:  01/28/2009.  CT, 02/24/2016. FINDINGS: Cardiac silhouette is normal in size and configuration. No mediastinal or hilar masses. No evidence of adenopathy. Lungs are hyperexpanded. There are mildly prominent bronchovascular markings with subtle peripheral lung base interstitial thickening. No lung consolidation or convincing pulmonary edema. No pleural effusion or pneumothorax. Skeletal structures are demineralized but intact. IMPRESSION: No acute cardiopulmonary disease. Electronically Signed   By: Lajean Manes M.D.   On: 09/20/2016 18:52    Disposition   Pt is being discharged home today in good condition.  Follow-up Plans & Appointments    Follow-up Information    Rozann Lesches, MD Follow up on 10/06/2016.   Specialty:  Cardiology Why:  at 4:00 pm for hospital follow up  Contact information: [place] [address] [phone]          Discharge Instructions    Diet - low sodium heart healthy    Complete by:  As directed    Discharge instructions    Complete by:  As directed    Radial Site Care Refer to this sheet in the next few weeks. These instructions provide you with information on caring for yourself after your procedure. Your caregiver may also give you more specific instructions. Your treatment has been planned according to current medical practices, but problems  sometimes occur. Call your caregiver if you have any problems or questions after your procedure. HOME CARE INSTRUCTIONS You may shower the day after the procedure.Remove the bandage (dressing) and gently wash the site with plain soap and water.Gently pat the site dry.  Do not apply powder or lotion to the site.  Do not submerge the affected site in water for 3 to 5 days.  Inspect the site at least twice daily.  Do not flex or bend the affected arm for 24 hours.  No lifting over 5 pounds (2.3 kg) for 5 days after your procedure.  Do not drive home if you are discharged the same day of the procedure. Have someone else drive you.  You may drive 24 hours after the procedure unless otherwise instructed by your caregiver.  What to expect: Any bruising will usually fade within 1 to 2 weeks.  Blood that collects in the tissue (hematoma) may be painful to the touch. It should usually decrease in size and tenderness within 1 to 2 weeks.  SEEK IMMEDIATE MEDICAL CARE IF: You have  unusual pain at the radial site.  You have redness, warmth, swelling, or pain at the radial site.  You have drainage (other than a small amount of blood on the dressing).  You have chills.  You have a fever or persistent symptoms for more than 72 hours.  You have a fever and your symptoms suddenly get worse.  Your arm becomes pale, cool, tingly, or numb.  You have heavy bleeding from the site. Hold pressure on the site.   Increase activity slowly    Complete by:  As directed       Discharge Medications   Current Discharge Medication List    START taking these medications   Details  metoprolol tartrate (LOPRESSOR) 25 MG tablet Take 0.5 tablets (12.5 mg total) by mouth 2 (two) times daily. Qty: 30 tablet, Refills: 12      CONTINUE these medications which have NOT CHANGED   Details  esomeprazole (NEXIUM) 40 MG capsule Take 40 mg by mouth daily at 12 noon.    levothyroxine (SYNTHROID, LEVOTHROID) 50 MCG tablet  Take 50 mcg by mouth daily.      Multiple Vitamins-Minerals (MULTIVITAL) tablet Take 1 tablet by mouth daily.      Omega-3 Fatty Acids (FISH OIL) 1000 MG CAPS Take by mouth daily.      PROBIOTIC CAPS Take by mouth daily.      simvastatin (ZOCOR) 20 MG tablet Take 1 tablet (20 mg total) by mouth daily. Qty: 90 tablet, Refills: 3      STOP taking these medications     metoprolol succinate (TOPROL-XL) 25 MG 24 hr tablet             Outstanding Labs/Studies     Duration of Discharge Encounter   Greater than 30 minutes including physician time.  Signed, Arbutus Leas NP 09/22/2016, 3:55 PM

## 2016-09-22 NOTE — Progress Notes (Signed)
Late entry  At  1330 pm  TR band removed, site level 0,  Site clean, dry & intact.   Dressing applied.    IV sited discontinued for right hand  &  Left hand.  Telemetry discontinued,  CCMD  Notified.    Patient up to bathroom voids qs.  Discharge instructions explained to patient  And husband, questions answered.  Mervyn Skeeters, RN

## 2016-09-22 NOTE — Progress Notes (Signed)
Received patient to room from cath lab, placed back on Telemetry, CCMD  Nptified,  HR  NSR, Right radial band in place with 15cc air currently.  Removed  3cc now, site level 0,  Patient alert/oriented.  Sprite given to drink.  Mervyn Skeeters, RN

## 2016-09-24 ENCOUNTER — Other Ambulatory Visit: Payer: Self-pay | Admitting: Cardiology

## 2016-09-24 DIAGNOSIS — I493 Ventricular premature depolarization: Secondary | ICD-10-CM

## 2016-09-24 DIAGNOSIS — I471 Supraventricular tachycardia, unspecified: Secondary | ICD-10-CM

## 2016-09-28 ENCOUNTER — Ambulatory Visit (INDEPENDENT_AMBULATORY_CARE_PROVIDER_SITE_OTHER): Payer: Medicare Other

## 2016-09-28 DIAGNOSIS — I493 Ventricular premature depolarization: Secondary | ICD-10-CM | POA: Diagnosis not present

## 2016-09-28 DIAGNOSIS — I471 Supraventricular tachycardia: Secondary | ICD-10-CM

## 2016-10-06 ENCOUNTER — Ambulatory Visit: Payer: Medicare Other | Admitting: Cardiology

## 2016-10-07 ENCOUNTER — Encounter: Payer: Self-pay | Admitting: Physician Assistant

## 2016-10-07 ENCOUNTER — Ambulatory Visit (INDEPENDENT_AMBULATORY_CARE_PROVIDER_SITE_OTHER): Payer: Medicare Other | Admitting: Physician Assistant

## 2016-10-07 VITALS — BP 140/78 | HR 79 | Ht 65.0 in | Wt 155.0 lb

## 2016-10-07 DIAGNOSIS — I471 Supraventricular tachycardia, unspecified: Secondary | ICD-10-CM

## 2016-10-07 DIAGNOSIS — I2510 Atherosclerotic heart disease of native coronary artery without angina pectoris: Secondary | ICD-10-CM | POA: Diagnosis not present

## 2016-10-07 DIAGNOSIS — I10 Essential (primary) hypertension: Secondary | ICD-10-CM | POA: Diagnosis not present

## 2016-10-07 NOTE — Patient Instructions (Signed)
Your physician recommends that you schedule a follow-up appointment in: 2 Months with Dr. McDowell  Your physician recommends that you continue on your current medications as directed. Please refer to the Current Medication list given to you today.  If you need a refill on your cardiac medications before your next appointment, please call your pharmacy.  Thank you for choosing [facility]!    

## 2016-10-07 NOTE — Progress Notes (Signed)
Cardiology Office Note    Date:  10/07/2016   ID:  Pryscilla, Amann August 10, 1941, MRN [id number]  PCP:  [facility]  Cardiologist: Dr. Domenic Polite  Chief Complaint  Patient presents with  . Follow-up    History of Present Illness:  Alejandra Singh is a 75 y.o. female  with history of SVT underwent cardiac catheterization for chest pain. This showed 50% distal LAD lesion not felt to be significant and normal LVEF. She had catheter-induced SVT and hypertension during the case. She was started on 12.5 mg of metoprolol daily. She self-reported bradycardia on outpatient monitor but while on telemetry in the hospital she did not have any bradycardia but did have a lot of PVCs and ventricular bigeminy. She was scheduled for an outpatient monitor.  Patient comes in today for f/u. She had some palpitations since she's been home but has been feeling better since Saturday. No further chest pain.She is wearing an event monitor. Denies any dizziness or presyncope.      Past Medical History:  Diagnosis Date  . FH: colonic polyps   . GERD (gastroesophageal reflux disease)   . Hyperlipidemia   . Hypothyroidism   . Iron deficiency anemia   . Osteoporosis   . 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 broken ankle    . CARDIAC CATHETERIZATION N/A 09/22/2016   Procedure: Left Heart Cath and Coronary Angiography;  Surgeon: Leonie Man, MD;  Location: [facility];  Service: Cardiovascular;  Laterality: N/A;  . COLONOSCOPY WITH PROPOFOL N/A 07/05/2015   Procedure: COLONOSCOPY WITH PROPOFOL;  Surgeon: Manya Silvas, MD;  Location[facility] ENDOSCOPY;  Service: Endoscopy;  Laterality: N/A;  . DILATION AND CURETTAGE OF UTERUS    . ESOPHAGOGASTRODUODENOSCOPY (EGD) WITH PROPOFOL N/A 03/19/2016   Procedure: ESOPHAGOGASTRODUODENOSCOPY (EGD) WITH PROPOFOL;  Surgeon: Manya Silvas,  MD;  Location: [facility] ENDOSCOPY;  Service: Endoscopy;  Laterality: N/A;  . TONSILLECTOMY      Current Medications: Outpatient Medications Prior to Visit  Medication Sig Dispense Refill  . esomeprazole (NEXIUM) 40 MG capsule Take 40 mg by mouth daily at 12 noon.    Marland Kitchen levothyroxine (SYNTHROID, LEVOTHROID) 50 MCG tablet Take 50 mcg by mouth daily.      . metoprolol tartrate (LOPRESSOR) 25 MG tablet Take 0.5 tablets (12.5 mg total) by mouth 2 (two) times daily. 30 tablet 12  . Multiple Vitamins-Minerals (MULTIVITAL) tablet Take 1 tablet by mouth daily.      . Omega-3 Fatty Acids (FISH OIL) 1000 MG CAPS Take by mouth daily.      Marland Kitchen PROBIOTIC CAPS Take by mouth daily.      . simvastatin (ZOCOR) 20 MG tablet Take 1 tablet (20 mg total) by mouth daily. 90 tablet 3   No facility-administered medications prior to visit.      Allergies:   Alendronate   Social History   Social History  . Marital status: Married    Spouse name: N/A  . Number of children: N/A  . Years of education: N/A   Occupational History  . Retired Information systems manager    Social History Main Topics  . Smoking status: Never Smoker  . Smokeless tobacco: Never Used  . Alcohol use No  . Drug use: No  . Sexual activity: Not Asked   Other Topics Concern  . None   Social History Narrative   [place] in  [place]     Family History:  The patient's   family history includes Alzheimer's disease in her mother; Dementia in her mother; Stroke in her father.   ROS:   Please see the history of present illness.    Review of Systems  Constitution: Positive for malaise/fatigue.  HENT: Negative.   Eyes: Negative.   Cardiovascular: Negative.   Respiratory: Negative.   Hematologic/Lymphatic: Negative.   Musculoskeletal: Positive for joint swelling and myalgias. Negative for joint pain.  Gastrointestinal: Negative.   Genitourinary: Negative.   Neurological: Negative.    All other systems reviewed and are negative.   PHYSICAL EXAM:    VS:  BP 140/78   Pulse 79   Ht 5\' 5"  (1.651 m)   Wt 155 lb (70.3 kg)   SpO2 97%   BMI 25.79 kg/m   Physical Exam  GEN: Well nourished, well developed, in no acute distress  Neck: no JVD, carotid bruits, or masses Cardiac:RRR; no murmurs, rubs, or gallops  Respiratory:  clear to auscultation bilaterally, normal work of breathing GI: soft, nontender, nondistended, + BS Ext: Right arm at cath site without hematoma or hemorrhage, good radial and brachial pulses, lower extremities without cyanosis, clubbing, or edema, Good distal pulses bilaterally MS: no deformity or atrophy  Psych: euthymic mood, full affect  Wt Readings from Last 3 Encounters:  10/07/16 155 lb (70.3 kg)  09/22/16 149 lb (67.6 kg)  03/19/16 151 lb (68.5 kg)      Studies/Labs Reviewed:   EKG:  EKG is  ordered today.  EKG normal sinus rhythm at 62 bpm with nonspecific ST-T wave changes, no acute change  Recent Labs: 09/20/2016: Magnesium 2.3; TSH 3.144 09/21/2016: BUN 12; Creatinine, Ser 0.68; Hemoglobin 13.3; Platelets 247; Potassium 3.6; Sodium 139   Lipid Panel    Component Value Date/Time   CHOL 200 06/18/2014 0820   TRIG 179 (H) 06/18/2014 0820   HDL 61 06/18/2014 0820   CHOLHDL 3.3 06/18/2014 0820   VLDL 36 06/18/2014 0820   LDLCALC 103 (H) 06/18/2014 0820    Additional studies/ records that were reviewed today include:   Left Heart Cath and Coronary Angiography 09/22/16  The left ventricular systolic function is normal. The left ventricular ejection fraction is 55-65% by visual estimate.  LV end diastolic pressure is normal.  Dist LAD lesion, 50 %stenosed - on exam, with a small bridging segment with kinking. Otherwise normal coronary arteries.   Angiographically, I do not see any significant coronary disease expanded the patient's symptoms. The distal LAD lesion is likely not significant, and there is at a bend with kinking suggestive of mild bridging. Not a good PCI target.   Patient also had  a catheter-induced SVT and was hypertensive during the case. Suggested maybe her symptoms are related to hypertension and SVT related microvascular ischemia.   Plan: Return to nursing unit for ongoing care and evaluation on telemetry. Consider event monitor. Likely non-macrovascular coronary disease-related chest pain   _____________    ASSESSMENT:    1. PSVT (paroxysmal supraventricular tachycardia) ([place])   2. Coronary artery calcification seen on CAT scan   3. Essential hypertension, benign      PLAN:  In order of problems listed above:  PSVT doing better on metoprolol. No evidence of bradycardia as she thought she had in the past. Wearing an event monitor at this time. Follow-up with Dr. Domenic Polite in 2 months.  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 Continue medical management  Hypertension controlled  Medication Adjustments/Labs and Tests Ordered: Current medicines are reviewed at length with the patient today.  Concerns regarding medicines are outlined above.  Medication changes, Labs and Tests ordered today are listed in the Patient Instructions below. Patient Instructions  Your physician recommends that you schedule a follow-up appointment in: 2 Months with Dr. Domenic Polite  Your physician recommends that you continue on your current medications as directed. Please refer to the Current Medication list given to you today.  If you need a refill on your cardiac medications before your next appointment, please call your pharmacy.  Thank you for choosing [facility]!       Signed, Ermalinda Barrios, PA-C  10/07/2016 2:23 PM    [facility] [place], [place], [place]  [address] Phone: [phone]; Fax: [phone]

## 2016-12-21 ENCOUNTER — Encounter: Payer: Self-pay | Admitting: Cardiology

## 2016-12-21 ENCOUNTER — Ambulatory Visit (INDEPENDENT_AMBULATORY_CARE_PROVIDER_SITE_OTHER): Payer: Medicare Other | Admitting: Cardiology

## 2016-12-21 VITALS — BP 144/80 | HR 70 | Ht 65.0 in | Wt 156.0 lb

## 2016-12-21 DIAGNOSIS — I493 Ventricular premature depolarization: Secondary | ICD-10-CM

## 2016-12-21 DIAGNOSIS — I2510 Atherosclerotic heart disease of native coronary artery without angina pectoris: Secondary | ICD-10-CM

## 2016-12-21 DIAGNOSIS — I471 Supraventricular tachycardia: Secondary | ICD-10-CM

## 2016-12-21 NOTE — Patient Instructions (Signed)

## 2016-12-21 NOTE — Progress Notes (Signed)
Cardiology Office Note  Date: 12/21/2016   ID: Mccartney, Keel [birth year], MRN [id number]  PCP: [facility]  Primary Cardiologist: Rozann Lesches, MD   Chief Complaint  Patient presents with  . PSVT  . PVCs    History of Present Illness: Alejandra Singh is a 76 y.o. female last seen in December 2017 by Ms. Bonnell Public PA-C. She presents for a follow-up visit. States that she has been doing very well, no recurrent chest pain on low-dose Lopressor. No palpitations or syncope.  Cardiac event monitor from December of last year showed sinus rhythm with occasional frequent PVCs and some ventricular bigeminy, no atrial fibrillation, SVT, or pauses. She had a heart catheterization in December 2017 as discussed below.  Past Medical History:  Diagnosis Date  . FH: colonic polyps   . GERD (gastroesophageal reflux disease)   . Hyperlipidemia   . Hypothyroidism   . Iron deficiency anemia   . Osteoporosis   . 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 esomeprazole (NEXIUM) 40 MG capsule Take 40 mg by mouth daily at 12 noon.    Marland Kitchen levothyroxine (SYNTHROID, LEVOTHROID) 50 MCG tablet Take 50 mcg by mouth daily.      . metoprolol tartrate (LOPRESSOR) 25 MG tablet Take 0.5 tablets (12.5 mg total) by mouth 2 (two) times daily. 30 tablet 12  . Multiple Vitamins-Minerals (MULTIVITAL) tablet Take 1 tablet by mouth daily.      . Omega-3 Fatty Acids (FISH OIL) 1000 MG CAPS Take by mouth daily.      Marland Kitchen PROBIOTIC CAPS Take by mouth daily.      . simvastatin (ZOCOR) 20 MG tablet Take 1 tablet (20 mg total) by mouth daily. 90 tablet 3   No current facility-administered medications for this visit.    Allergies:  Alendronate   Social History: The patient  reports that she has never smoked. She has never used smokeless tobacco. She reports that she does not drink alcohol or use  drugs.   ROS:  Please see the history of present illness. Otherwise, complete review of systems is positive for none.  All other systems are reviewed and negative.   Physical Exam: VS:  BP (!) 144/80   Pulse 70   Ht 5\' 5"  (1.651 m)   Wt 156 lb (70.8 kg)   SpO2 97%   BMI 25.96 kg/m , BMI Body mass index is 25.96 kg/m.  Wt Readings from Last 3 Encounters:  12/21/16 156 lb (70.8 kg)  10/07/16 155 lb (70.3 kg)  09/22/16 149 lb (67.6 kg)    Comfortable at rest. HEENT: Conjunctiva and lids are normal, oropharynx clear.  Neck: Supple, no elevated JVP or bruits.  Lungs: Clear to auscultation, nonlabored.  Cardiac: Regular rate and rhythm, no S3 gallop or pericardial rub.  Abdomen: Soft, nontender, bowel sounds present.  Skin: Warm and dry.  Extremities: No pitting edema, distal pulses full.  ECG: I personally reviewed the tracing from 10/07/2016 which showed sinus rhythm with nonspecific ST-T changes.  Recent Labwork: 09/20/2016: Magnesium 2.3; TSH 3.144 09/21/2016: BUN 12; Creatinine, Ser 0.68; Hemoglobin 13.3; Platelets 247; Potassium 3.6; Sodium 139     Component Value Date/Time   CHOL 200 06/18/2014 0820   TRIG 179 (H) 06/18/2014 0820   HDL 61 06/18/2014 0820   CHOLHDL 3.3 06/18/2014 0820   VLDL 36 06/18/2014 0820   LDLCALC 103 (  H) 06/18/2014 0820    Other Studies Reviewed Today:  Cardiac catheterization 09/22/2016:  The left ventricular systolic function is normal. The left ventricular ejection fraction is 55-65% by visual estimate.  LV end diastolic pressure is normal.  Dist LAD lesion, 50 %stenosed - on exam, with a small bridging segment with kinking. Otherwise normal coronary arteries.   Angiographically, I do not see any significant coronary disease expanded the patient's symptoms. The distal LAD lesion is likely not significant, and there is at a bend with kinking suggestive of mild bridging. Not a good PCI target.  Patient also had a catheter-induced SVT  and was hypertensive during the case. Suggested maybe her symptoms are related to hypertension and SVT related microvascular ischemia.  Assessment and Plan:  1. History of PSVT and symptomatic PVCs. She is tolerating low-dose Lopressor very well with improvement in symptoms.  2. 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 Continue observation.  3. Hyperlipidemia, on Zocor and omega-3 supplements.  Current medicines were reviewed with the patient today.  Disposition: Follow-up in 6 months.  Signed, Satira Sark, MD, [facility] 12/21/2016 3:40 PM    [facility] [address] [place], [place] [address] Phone: [phone]; Fax: [phone]

## 2016-12-30 ENCOUNTER — Other Ambulatory Visit: Payer: Self-pay | Admitting: Family Medicine

## 2016-12-30 DIAGNOSIS — Z1231 Encounter for screening mammogram for malignant neoplasm of breast: Secondary | ICD-10-CM

## 2017-01-29 ENCOUNTER — Ambulatory Visit
Admission: RE | Admit: 2017-01-29 | Discharge: 2017-01-29 | Disposition: A | Discharge status: Home / Self Care | Payer: Medicare Other | Source: Ambulatory Visit | Attending: Family Medicine | Admitting: Family Medicine

## 2017-01-29 DIAGNOSIS — Z1231 Encounter for screening mammogram for malignant neoplasm of breast: Secondary | ICD-10-CM

## 2017-06-28 NOTE — Progress Notes (Signed)
Cardiology Office Note  Date: 06/29/2017   ID: Azizah, Lisle [birth year], MRN [id number]  PCP: [facility] [place]  Primary Cardiologist: Rozann Lesches, MD   Chief Complaint  Patient presents with  . PVCs    History of Present Illness: Alejandra Singh is a 76 y.o. female last seen in March. She presents today for a routine follow-up visit. Still doing well, reports no chest pain or palpitations on low-dose beta blocker. She has had no dizziness or syncope. She and her husband have been trying to walk for exercise, although not as much during the high heat and humidity of the summer.  I reviewed her medications which are outlined below. She reports no changes or intolerances.  Past Medical History:  Diagnosis Date  . FH: colonic polyps   . GERD (gastroesophageal reflux disease)   . Hyperlipidemia   . Hypothyroidism   . Iron deficiency anemia   . Osteoporosis   . 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 broken ankle    . CARDIAC CATHETERIZATION N/A 09/22/2016   Procedure: Left Heart Cath and Coronary Angiography;  Surgeon: Leonie Man, MD;  Location: [place] [facility];  Service: Cardiovascular;  Laterality: N/A;  . COLONOSCOPY WITH PROPOFOL N/A 07/05/2015   Procedure: COLONOSCOPY WITH PROPOFOL;  Surgeon: Manya Silvas, MD;  Location: [facility] ENDOSCOPY;  Service: Endoscopy;  Laterality: N/A;  . DILATION AND CURETTAGE OF UTERUS    . ESOPHAGOGASTRODUODENOSCOPY (EGD) WITH PROPOFOL N/A 03/19/2016   Procedure: ESOPHAGOGASTRODUODENOSCOPY (EGD) WITH PROPOFOL;  Surgeon: Manya Silvas, MD;  Location: [facility] ENDOSCOPY;  Service: Endoscopy;  Laterality: N/A;  . TONSILLECTOMY      Current Outpatient Prescriptions  Medication Sig Dispense Refill  . esomeprazole (NEXIUM) 40 MG capsule Take 40 mg by mouth daily at 12 noon.    Marland Kitchen levothyroxine (SYNTHROID, LEVOTHROID) 50  MCG tablet Take 50 mcg by mouth daily.      . metoprolol tartrate (LOPRESSOR) 25 MG tablet Take 0.5 tablets (12.5 mg total) by mouth 2 (two) times daily. 30 tablet 12  . Multiple Vitamins-Minerals (MULTIVITAL) tablet Take 1 tablet by mouth daily.      . Omega-3 Fatty Acids (FISH OIL) 1000 MG CAPS Take by mouth daily.      Marland Kitchen PROBIOTIC CAPS Take by mouth daily.      . simvastatin (ZOCOR) 20 MG tablet Take 1 tablet (20 mg total) by mouth daily. 90 tablet 3   No current facility-administered medications for this visit.    Allergies:  Alendronate   Social History: The patient  reports that she has never smoked. She has never used smokeless tobacco. She reports that she does not drink alcohol or use drugs.   ROS:  Please see the history of present illness. Otherwise, complete review of systems is positive for none.  All other systems are reviewed and negative.   Physical Exam: VS:  BP 128/78   Pulse 60   Ht 5\' 5"  (1.651 m)   Wt 156 lb (70.8 kg)   SpO2 97%   BMI 25.96 kg/m , BMI Body mass index is 25.96 kg/m.  Wt Readings from Last 3 Encounters:  06/29/17 156 lb (70.8 kg)  12/21/16 156 lb (70.8 kg)  10/07/16 155 lb (70.3 kg)    Comfortable at rest. HEENT: Conjunctiva and lids are normal, oropharynx clear.  Neck: Supple, no elevated JVP or bruits.  Lungs: Clear to auscultation, nonlabored.  Cardiac: Regular rate and rhythm, no S3 gallop or pericardial rub.  Abdomen: Soft, nontender, bowel sounds present.  Skin: Warm and dry.  Extremities: No pitting edema, distal pulses full.  ECG: I personally reviewed the tracing from 10/07/2016 which showed sinus rhythm with nonspecific ST-T changes.  Recent Labwork: 09/20/2016: Magnesium 2.3; TSH 3.144 09/21/2016: BUN 12; Creatinine, Ser 0.68; Hemoglobin 13.3; Platelets 247; Potassium 3.6; Sodium 139   Other Studies Reviewed Today:  Cardiac catheterization 09/22/2016:  The left ventricular systolic function is normal. The left  ventricular ejection fraction is 55-65% by visual estimate.  LV end diastolic pressure is normal.  Dist LAD lesion, 50 %stenosed - on exam, with a small bridging segment with kinking. Otherwise normal coronary arteries.  Angiographically, I do not see any significant coronary disease expanded the patient's symptoms. The distal LAD lesion is likely not significant, and there is at a bend with kinking suggestive of mild bridging. Not a good PCI target.  Patient also had a catheter-induced SVT and was hypertensive during the case. Suggested maybe her symptoms are related to hypertension and SVT related microvascular ischemia.  Assessment and Plan:  1. Symptomatically stable without palpitations on low-dose beta blocker. She has a history of PSVT and also symptomatic PVCs. No dizziness or syncope.  2. 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 Continue statin therapy.  Current medicines were reviewed with the patient today.  Disposition: Follow-up in 6 months.  Signed, Satira Sark, MD, [facility] 06/29/2017 4:00 PM    [place] at [facility] [address] [place], [place] [address] Phone: [phone]; Fax: [phone]

## 2017-06-29 ENCOUNTER — Encounter: Payer: Self-pay | Admitting: Cardiology

## 2017-06-29 ENCOUNTER — Ambulatory Visit (INDEPENDENT_AMBULATORY_CARE_PROVIDER_SITE_OTHER): Payer: Medicare Other | Admitting: Cardiology

## 2017-06-29 VITALS — BP 128/78 | HR 60 | Ht 65.0 in | Wt 156.0 lb

## 2017-06-29 DIAGNOSIS — I2510 Atherosclerotic heart disease of native coronary artery without angina pectoris: Secondary | ICD-10-CM

## 2017-06-29 DIAGNOSIS — I471 Supraventricular tachycardia: Secondary | ICD-10-CM

## 2017-06-29 DIAGNOSIS — I493 Ventricular premature depolarization: Secondary | ICD-10-CM

## 2017-06-29 NOTE — Patient Instructions (Signed)
Your physician wants you to follow-up in:6 months with Dr.McDowell You will receive a reminder letter in the mail two months in advance. If you don't receive a letter, please call our office to schedule the follow-up appointment.   Your physician recommends that you continue on your current medications as directed. Please refer to the Current Medication list given to you today.   If you need a refill on your cardiac medications before your next appointment, please call your pharmacy.    No lab work or tests ordered today.      Thank you for choosing [facility] !         

## 2017-11-12 ENCOUNTER — Other Ambulatory Visit: Payer: Self-pay | Admitting: Cardiology

## 2017-11-15 ENCOUNTER — Other Ambulatory Visit: Payer: Self-pay | Admitting: Cardiology

## 2017-11-15 MED ORDER — METOPROLOL TARTRATE 25 MG PO TABS: 12.5000 mg | ORAL_TABLET | Freq: Two times a day (BID) | ORAL | 3 refills | Status: DC

## 2017-11-15 NOTE — Telephone Encounter (Signed)
Refilled as requested  

## 2017-11-15 NOTE — Telephone Encounter (Signed)
°*  STAT* If patient is at the pharmacy, call can be transferred to refill team.   1. Which medications need to be refilled? (please list name of each medication and dose if known) Metoprolol TAR 25 mg 2. Which pharmacy/location (including street and city if local pharmacy) is medication to be sent to?[facility] on [address] in [place]  3. Do they need a 30 day or 90 day supply? 90 day supply

## 2018-01-24 NOTE — Progress Notes (Signed)
Cardiology Office Note  Date: 01/25/2018   ID: Alejandra Singh 03-19-1941, MRN [id number]  PCP: [facility] [place]  Primary Cardiologist: Alejandra Lesches, MD   Chief Complaint  Patient presents with  . PVCs  . PSVT    History of Present Illness: Alejandra Singh is a 77 y.o. female last seen in September 2018.  She presents today for a routine follow-up visit.  Reports doing very well on low-dose beta-blocker, no sense of palpitations and no chest pain.  She has had no dizziness or syncope.  I personally reviewed her ECG today which shows a sinus rhythm with nonspecific T wave changes.  She remains on Lopressor at 12.5 mg twice daily.  Also on Zocor.  She plans to follow-up with her PCP soon for a physical with lab work.  Past Medical History:  Diagnosis Date  . FH: colonic polyps   . GERD (gastroesophageal reflux disease)   . Hyperlipidemia   . Hypothyroidism   . Iron deficiency anemia   . Osteoporosis   . 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 broken ankle    . CARDIAC CATHETERIZATION N/A 09/22/2016   Procedure: Left Heart Cath and Coronary Angiography;  Surgeon: Alejandra Man, MD;  Location: [place] CV LAB;  Service: Cardiovascular;  Laterality: N/A;  . COLONOSCOPY WITH PROPOFOL N/A 07/05/2015   Procedure: COLONOSCOPY WITH PROPOFOL;  Surgeon: Alejandra Silvas, MD;  Location: [facility] ENDOSCOPY;  Service: Endoscopy;  Laterality: N/A;  . DILATION AND CURETTAGE OF UTERUS    . ESOPHAGOGASTRODUODENOSCOPY (EGD) WITH PROPOFOL N/A 03/19/2016   Procedure: ESOPHAGOGASTRODUODENOSCOPY (EGD) WITH PROPOFOL;  Surgeon: Alejandra Silvas, MD;  Location: [facility] ENDOSCOPY;  Service: Endoscopy;  Laterality: N/A;  . TONSILLECTOMY      Current Outpatient Medications  Medication Sig Dispense Refill  . esomeprazole (NEXIUM) 40 MG capsule Take 40 mg by mouth daily at 12 noon.      Marland Kitchen levothyroxine (SYNTHROID, LEVOTHROID) 50 MCG tablet Take 50 mcg by mouth daily.      . metoprolol tartrate (LOPRESSOR) 25 MG tablet Take 0.5 tablets (12.5 mg total) by mouth 2 (two) times daily. 90 tablet 3  . Multiple Vitamins-Minerals (MULTIVITAL) tablet Take 1 tablet by mouth daily.      . Omega-3 Fatty Acids (FISH OIL) 1000 MG CAPS Take by mouth daily.      Marland Kitchen PROBIOTIC CAPS Take by mouth daily.      . simvastatin (ZOCOR) 20 MG tablet Take 1 tablet (20 mg total) by mouth daily. 90 tablet 3   No current facility-administered medications for this visit.    Allergies:  Alendronate   Social History: The patient  reports that she has never smoked. She has never used smokeless tobacco. She reports that she does not drink alcohol or use drugs.   ROS:  Please see the history of present illness. Otherwise, complete review of systems is positive for none.  All other systems are reviewed and negative.   Physical Exam: VS:  BP 122/64 (BP Location: Left Arm)   Pulse 61   Ht 5\' 5"  (1.651 m)   Wt 153 lb (69.4 kg)   SpO2 95%   BMI 25.46 kg/m , BMI Body mass index is 25.46 kg/m.  Wt Readings from Last 3 Encounters:  01/25/18 153 lb (69.4 kg)  06/29/17 156 lb (70.8 kg)  12/21/16 156 lb (70.8 kg)  General: Patient appears comfortable at rest. HEENT: Conjunctiva and lids normal, oropharynx clear. Neck: Supple, no elevated JVP or carotid bruits, no thyromegaly. Lungs: Clear to auscultation, nonlabored breathing at rest. Cardiac: Regular rate and rhythm, no S3 or significant systolic murmur. Abdomen: Soft, nontender, bowel sounds present. Extremities: No pitting edema, distal pulses 2+.  ECG: I personally reviewed the tracing from 10/07/2016 which showed sinus rhythm with nonspecific ST-T changes.  Recent Labwork:  09/20/2016: Magnesium 2.3; TSH 3.144 09/21/2016: BUN 12; Creatinine, Ser 0.68; Hemoglobin 13.3; Platelets 247; Potassium 3.6; Sodium 139   Other Studies Reviewed  Today:  Cardiac catheterization 09/22/2016:  The left ventricular systolic function is normal. The left ventricular ejection fraction is 55-65% by visual estimate.  LV end diastolic pressure is normal.  Dist LAD lesion, 50 %stenosed - on exam, with a small bridging segment with kinking. Otherwise normal coronary arteries.  Angiographically, I do not see any significant coronary disease expanded the patient's symptoms. The distal LAD lesion is likely not significant, and there is at a bend with kinking suggestive of mild bridging. Not a good PCI target.  Patient also had a catheter-induced SVT and was hypertensive during the case. Suggested maybe her symptoms are related to hypertension and SVT related microvascular ischemia.  Assessment and Plan:  1.  History of palpitations with symptomatic PVCs, currently doing very well on low-dose beta-blocker.  No changes made today.  2.  History of PSVT, quiescent.  3.  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  No angina symptoms reported.  She remains on statin therapy.  Current medicines were reviewed with the patient today.   Orders Placed This Encounter  Procedures  . EKG 12-Lead    Disposition: Follow-up in 6 months.  Signed, Alejandra Sark, MD, [facility] 01/25/2018 1:51 PM    [place] at [facility] [address], [place], [place] [address] Phone: [phone]; Fax: [phone]

## 2018-01-25 ENCOUNTER — Encounter: Payer: Self-pay | Admitting: Cardiology

## 2018-01-25 ENCOUNTER — Ambulatory Visit (INDEPENDENT_AMBULATORY_CARE_PROVIDER_SITE_OTHER): Payer: Medicare Other | Admitting: Cardiology

## 2018-01-25 VITALS — BP 122/64 | HR 61 | Ht 65.0 in | Wt 153.0 lb

## 2018-01-25 DIAGNOSIS — I493 Ventricular premature depolarization: Secondary | ICD-10-CM | POA: Diagnosis not present

## 2018-01-25 DIAGNOSIS — I471 Supraventricular tachycardia: Secondary | ICD-10-CM

## 2018-01-25 DIAGNOSIS — I2510 Atherosclerotic heart disease of native coronary artery without angina pectoris: Secondary | ICD-10-CM | POA: Diagnosis not present

## 2018-01-25 NOTE — Patient Instructions (Signed)
Your physician wants you to follow-up in:6 months with Dr.McDowell You will receive a reminder letter in the mail two months in advance. If you don't receive a letter, please call our office to schedule the follow-up appointment.   Your physician recommends that you continue on your current medications as directed. Please refer to the Current Medication list given to you today.   If you need a refill on your cardiac medications before your next appointment, please call your pharmacy.    No lab work or tests ordered today.      Thank you for choosing [facility] !         

## 2018-02-03 ENCOUNTER — Other Ambulatory Visit: Payer: Self-pay | Admitting: Family Medicine

## 2018-02-03 DIAGNOSIS — Z1231 Encounter for screening mammogram for malignant neoplasm of breast: Secondary | ICD-10-CM

## 2018-02-22 ENCOUNTER — Ambulatory Visit
Admission: RE | Admit: 2018-02-22 | Discharge: 2018-02-22 | Disposition: A | Discharge status: Home / Self Care | Payer: Medicare Other | Source: Ambulatory Visit | Attending: Family Medicine | Admitting: Family Medicine

## 2018-02-22 DIAGNOSIS — Z1231 Encounter for screening mammogram for malignant neoplasm of breast: Secondary | ICD-10-CM | POA: Diagnosis present

## 2018-03-26 ENCOUNTER — Observation Stay (HOSPITAL_COMMUNITY): Payer: Medicare Other

## 2018-03-26 ENCOUNTER — Encounter (HOSPITAL_COMMUNITY): Payer: Self-pay | Admitting: Emergency Medicine

## 2018-03-26 ENCOUNTER — Observation Stay (HOSPITAL_COMMUNITY)
Admission: EM | Admit: 2018-03-26 | Discharge: 2018-03-28 | Disposition: A | Discharge status: Home / Self Care | Payer: Medicare Other | Attending: Cardiology | Admitting: Cardiology

## 2018-03-26 ENCOUNTER — Other Ambulatory Visit: Payer: Self-pay

## 2018-03-26 ENCOUNTER — Emergency Department (HOSPITAL_COMMUNITY): Payer: Medicare Other

## 2018-03-26 DIAGNOSIS — I119 Hypertensive heart disease without heart failure: Secondary | ICD-10-CM | POA: Diagnosis not present

## 2018-03-26 DIAGNOSIS — I25118 Atherosclerotic heart disease of native coronary artery with other forms of angina pectoris: Secondary | ICD-10-CM

## 2018-03-26 DIAGNOSIS — K219 Gastro-esophageal reflux disease without esophagitis: Secondary | ICD-10-CM | POA: Diagnosis not present

## 2018-03-26 DIAGNOSIS — I471 Supraventricular tachycardia: Secondary | ICD-10-CM | POA: Insufficient documentation

## 2018-03-26 DIAGNOSIS — E782 Mixed hyperlipidemia: Secondary | ICD-10-CM | POA: Diagnosis not present

## 2018-03-26 DIAGNOSIS — I25110 Atherosclerotic heart disease of native coronary artery with unstable angina pectoris: Principal | ICD-10-CM | POA: Insufficient documentation

## 2018-03-26 DIAGNOSIS — Z79890 Hormone replacement therapy (postmenopausal): Secondary | ICD-10-CM | POA: Insufficient documentation

## 2018-03-26 DIAGNOSIS — I2510 Atherosclerotic heart disease of native coronary artery without angina pectoris: Secondary | ICD-10-CM | POA: Diagnosis present

## 2018-03-26 DIAGNOSIS — M810 Age-related osteoporosis without current pathological fracture: Secondary | ICD-10-CM | POA: Diagnosis not present

## 2018-03-26 DIAGNOSIS — I051 Rheumatic mitral insufficiency: Secondary | ICD-10-CM | POA: Insufficient documentation

## 2018-03-26 DIAGNOSIS — Z9889 Other specified postprocedural states: Secondary | ICD-10-CM | POA: Insufficient documentation

## 2018-03-26 DIAGNOSIS — R0789 Other chest pain: Secondary | ICD-10-CM

## 2018-03-26 DIAGNOSIS — Z888 Allergy status to other drugs, medicaments and biological substances status: Secondary | ICD-10-CM | POA: Insufficient documentation

## 2018-03-26 DIAGNOSIS — I493 Ventricular premature depolarization: Secondary | ICD-10-CM

## 2018-03-26 DIAGNOSIS — I200 Unstable angina: Secondary | ICD-10-CM

## 2018-03-26 DIAGNOSIS — I10 Essential (primary) hypertension: Secondary | ICD-10-CM | POA: Diagnosis not present

## 2018-03-26 DIAGNOSIS — Z955 Presence of coronary angioplasty implant and graft: Secondary | ICD-10-CM | POA: Insufficient documentation

## 2018-03-26 DIAGNOSIS — Z79899 Other long term (current) drug therapy: Secondary | ICD-10-CM | POA: Insufficient documentation

## 2018-03-26 DIAGNOSIS — R079 Chest pain, unspecified: Secondary | ICD-10-CM

## 2018-03-26 DIAGNOSIS — E039 Hypothyroidism, unspecified: Secondary | ICD-10-CM | POA: Diagnosis not present

## 2018-03-26 HISTORY — DX: Atherosclerotic heart disease of native coronary artery without angina pectoris: I25.10

## 2018-03-26 HISTORY — DX: Essential (primary) hypertension: I10

## 2018-03-26 LAB — BASIC METABOLIC PANEL
ANION GAP: 13 (ref 5–15)
BUN: 8 mg/dL (ref 6–20)
CALCIUM: 9.2 mg/dL (ref 8.9–10.3)
CO2: 21 mmol/L — AB (ref 22–32)
Chloride: 103 mmol/L (ref 101–111)
Creatinine, Ser: 0.6 mg/dL (ref 0.44–1.00)
Glucose, Bld: 103 mg/dL — ABNORMAL HIGH (ref 65–99)
Potassium: 3.8 mmol/L (ref 3.5–5.1)
SODIUM: 137 mmol/L (ref 135–145)

## 2018-03-26 LAB — CBC
HCT: 41.5 % (ref 36.0–46.0)
HCT: 40.1 % (ref 36.0–46.0)
HEMOGLOBIN: 13.7 g/dL (ref 12.0–15.0)
Hemoglobin: 13.3 g/dL (ref 12.0–15.0)
MCH: 27.7 pg (ref 26.0–34.0)
MCH: 27.9 pg (ref 26.0–34.0)
MCHC: 33 g/dL (ref 30.0–36.0)
MCHC: 33.2 g/dL (ref 30.0–36.0)
MCV: 84 fL (ref 78.0–100.0)
MCV: 84.2 fL (ref 78.0–100.0)
PLATELETS: 266 10*3/uL (ref 150–400)
Platelets: 290 10*3/uL (ref 150–400)
RBC: 4.94 MIL/uL (ref 3.87–5.11)
RBC: 4.76 MIL/uL (ref 3.87–5.11)
RDW: 13.2 % (ref 11.5–15.5)
RDW: 13.2 % (ref 11.5–15.5)
WBC: 8.7 10*3/uL (ref 4.0–10.5)
WBC: 6.9 10*3/uL (ref 4.0–10.5)

## 2018-03-26 LAB — I-STAT TROPONIN, ED: TROPONIN I, POC: 0.01 ng/mL (ref 0.00–0.08)

## 2018-03-26 LAB — URINALYSIS, ROUTINE W REFLEX MICROSCOPIC
BILIRUBIN URINE: NEGATIVE
Glucose, UA: NEGATIVE mg/dL
Hgb urine dipstick: NEGATIVE
KETONES UR: NEGATIVE mg/dL
LEUKOCYTES UA: NEGATIVE
NITRITE: NEGATIVE
PROTEIN: NEGATIVE mg/dL
Specific Gravity, Urine: 1.002 — ABNORMAL LOW (ref 1.005–1.030)
pH: 8 (ref 5.0–8.0)

## 2018-03-26 LAB — TSH: TSH: 3.275 u[IU]/mL (ref 0.350–4.500)

## 2018-03-26 LAB — TROPONIN I
Troponin I: <0.03 ng/mL (ref <0.03)
Troponin I: <0.03 ng/mL (ref <0.03)

## 2018-03-26 LAB — MAGNESIUM: Magnesium: 2.1 mg/dL (ref 1.7–2.4)

## 2018-03-26 LAB — CREATININE, SERUM
Creatinine, Ser: 0.61 mg/dL (ref 0.44–1.00)
GFR calc Af Amer: >60 mL/min (ref >60)
GFR calc non Af Amer: >60 mL/min (ref >60)

## 2018-03-26 MED ORDER — ASPIRIN EC 81 MG PO TBEC
81.0000 mg | DELAYED_RELEASE_TABLET | Freq: Every day | ORAL | Status: DC
  Administered 2018-03-27 – 2018-03-28 (×2): 81 mg via ORAL
  Filled 2018-03-26 (×2): qty 1

## 2018-03-26 MED ORDER — METOPROLOL TARTRATE 25 MG PO TABS
25.0000 mg | ORAL_TABLET | Freq: Two times a day (BID) | ORAL | Status: DC
  Administered 2018-03-27: 25 mg via ORAL
  Filled 2018-03-26 (×2): qty 1

## 2018-03-26 MED ORDER — LEVOTHYROXINE SODIUM 50 MCG PO TABS
50.0000 ug | ORAL_TABLET | Freq: Every day | ORAL | Status: DC
  Administered 2018-03-27 – 2018-03-28 (×2): 50 ug via ORAL
  Filled 2018-03-26 (×2): qty 1

## 2018-03-26 MED ORDER — NITROGLYCERIN 0.4 MG SL SUBL: 0.4000 mg | SUBLINGUAL_TABLET | SUBLINGUAL | Status: DC | PRN

## 2018-03-26 MED ORDER — METOPROLOL TARTRATE 12.5 MG HALF TABLET
12.5000 mg | ORAL_TABLET | Freq: Once | ORAL | Status: AC
  Administered 2018-03-26: 12.5 mg via ORAL

## 2018-03-26 MED ORDER — ADULT MULTIVITAMIN W/MINERALS CH
1.0000 | ORAL_TABLET | Freq: Every day | ORAL | Status: DC
  Administered 2018-03-27 – 2018-03-28 (×2): 1 via ORAL
  Filled 2018-03-26 (×3): qty 1

## 2018-03-26 MED ORDER — NITROGLYCERIN 2 % TD OINT
1.0000 [in_us] | TOPICAL_OINTMENT | Freq: Once | TRANSDERMAL | Status: AC
  Administered 2018-03-26: 1 [in_us] via TOPICAL
  Filled 2018-03-26: qty 1

## 2018-03-26 MED ORDER — ONDANSETRON HCL 4 MG/2ML IJ SOLN: 4.0000 mg | Freq: Four times a day (QID) | INTRAMUSCULAR | Status: DC | PRN

## 2018-03-26 MED ORDER — IOPAMIDOL (ISOVUE-370) INJECTION 76%
80.0000 mL | Freq: Once | INTRAVENOUS | Status: AC | PRN
  Administered 2018-03-26: 100 mL via INTRAVENOUS

## 2018-03-26 MED ORDER — NITROGLYCERIN 2 % TD OINT
0.5000 [in_us] | TOPICAL_OINTMENT | Freq: Four times a day (QID) | TRANSDERMAL | Status: DC
  Administered 2018-03-26 – 2018-03-27 (×3): 0.5 [in_us] via TOPICAL
  Filled 2018-03-26: qty 30

## 2018-03-26 MED ORDER — NITROGLYCERIN 0.4 MG SL SUBL
SUBLINGUAL_TABLET | SUBLINGUAL | Status: AC
  Administered 2018-03-26: 0.4 mg
  Filled 2018-03-26: qty 1

## 2018-03-26 MED ORDER — PANTOPRAZOLE SODIUM 40 MG PO TBEC
40.0000 mg | DELAYED_RELEASE_TABLET | Freq: Every day | ORAL | Status: DC
  Administered 2018-03-26 – 2018-03-28 (×3): 40 mg via ORAL
  Filled 2018-03-26 (×3): qty 1

## 2018-03-26 MED ORDER — IOPAMIDOL (ISOVUE-370) INJECTION 76%
INTRAVENOUS | Status: AC
  Filled 2018-03-26: qty 100

## 2018-03-26 MED ORDER — HEPARIN SODIUM (PORCINE) 5000 UNIT/ML IJ SOLN
5000.0000 [IU] | Freq: Three times a day (TID) | INTRAMUSCULAR | Status: DC
  Administered 2018-03-26 – 2018-03-28 (×5): 5000 [IU] via SUBCUTANEOUS
  Filled 2018-03-26 (×5): qty 1

## 2018-03-26 MED ORDER — ACETAMINOPHEN 325 MG PO TABS
650.0000 mg | ORAL_TABLET | ORAL | Status: DC | PRN
  Administered 2018-03-26: 650 mg via ORAL
  Filled 2018-03-26: qty 2

## 2018-03-26 MED ORDER — SIMVASTATIN 20 MG PO TABS
20.0000 mg | ORAL_TABLET | Freq: Every day | ORAL | Status: DC
  Administered 2018-03-26 – 2018-03-28 (×3): 20 mg via ORAL
  Filled 2018-03-26 (×3): qty 1

## 2018-03-26 MED ORDER — AMLODIPINE BESYLATE 2.5 MG PO TABS
2.5000 mg | ORAL_TABLET | Freq: Every day | ORAL | Status: DC
  Administered 2018-03-26 – 2018-03-28 (×3): 2.5 mg via ORAL
  Filled 2018-03-26 (×3): qty 1

## 2018-03-26 NOTE — ED Provider Notes (Addendum)
[facility] EMERGENCY DEPARTMENT Provider Note   CSN: [id number] Arrival date & time: 03/26/18  0758     History   Chief Complaint Chief Complaint  Patient presents with  . Chest Pain    HPI Alejandra Singh is a 77 y.o. female.  HPI Complains of anterior chest pain for the past 6 weeks.  Becoming worse over the past 3 days, sometimes occurring at rest.  Symptoms occur sometimes at rest radiate to anterior neck.  Accompanied by shortness of breath and generalized weakness.  Symptoms worse with exertion and improved with rest.  No treatment prior to coming here.  No other associated symptoms.  Presently discomfort is mild.  Symptoms are intermittent lasting "different periods of time,' usually around the minutes or so.  Presently discomfort is minimal.  No other associated symptoms.  Treated herself with aspirin 162 mg this morning. Past Medical History:  Diagnosis Date  . FH: colonic polyps   . GERD (gastroesophageal reflux disease)   . Hyperlipidemia   . Hypothyroidism   . Iron deficiency anemia   . Osteoporosis   . 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 Bradycardia with 31-40 beats per minute 09/21/2016  . 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 Hypothyroidism   . Gastroesophageal reflux disease   . Coronary artery calcification seen on CAT scan 03/22/2016  . Essential hypertension, benign 08/28/2010  . Paroxysmal supraventricular tachycardia ([place]) 08/28/2010  . Mixed hyperlipidemia 05/14/2009    Past Surgical History:  Procedure Laterality Date  . broken ankle    . CARDIAC CATHETERIZATION N/A 09/22/2016   Procedure: Left Heart Cath and Coronary Angiography;  Surgeon: Leonie Man, MD;  Location: [place] CV LAB;  Service: Cardiovascular;  Laterality: N/A;  . COLONOSCOPY WITH PROPOFOL N/A 07/05/2015   Procedure: COLONOSCOPY WITH PROPOFOL;  Surgeon:  Manya Silvas, MD;  Location: [facility] ENDOSCOPY;  Service: Endoscopy;  Laterality: N/A;  . DILATION AND CURETTAGE OF UTERUS    . ESOPHAGOGASTRODUODENOSCOPY (EGD) WITH PROPOFOL N/A 03/19/2016   Procedure: ESOPHAGOGASTRODUODENOSCOPY (EGD) WITH PROPOFOL;  Surgeon: Manya Silvas, MD;  Location: [facility] ENDOSCOPY;  Service: Endoscopy;  Laterality: N/A;  . TONSILLECTOMY       OB History   None      Home Medications    Prior to Admission medications   Medication Sig Start Date End Date Taking? Authorizing Provider  esomeprazole (NEXIUM) 40 MG capsule Take 40 mg by mouth daily at 12 noon.    [provider]  levothyroxine (SYNTHROID, LEVOTHROID) 50 MCG tablet Take 50 mcg by mouth daily.      [provider]  metoprolol tartrate (LOPRESSOR) 25 MG tablet Take 0.5 tablets (12.5 mg total) by mouth 2 (two) times daily. 11/15/17   Satira Sark, MD  Multiple Vitamins-Minerals (MULTIVITAL) tablet Take 1 tablet by mouth daily.      [provider]  Omega-3 Fatty Acids (FISH OIL) 1000 MG CAPS Take by mouth daily.      [provider]  PROBIOTIC CAPS Take by mouth daily.      [provider]  simvastatin (ZOCOR) 20 MG tablet Take 1 tablet (20 mg total) by mouth daily. 02/06/16   Satira Sark, MD    Family History Family History  Problem Relation Age of Onset  . Stroke Father   . Alzheimer's disease Mother   . Dementia Mother   .  Coronary artery disease Neg Hx     Social History Social History   Tobacco Use  . Smoking status: Never Smoker  . Smokeless tobacco: Never Used  Substance Use Topics  . Alcohol use: No  . Drug use: No     Allergies   Alendronate   Review of Systems Review of Systems  Constitutional: Negative.   HENT: Negative.   Respiratory: Positive for shortness of breath.   Cardiovascular: Positive for chest pain.  Gastrointestinal: Negative.   Musculoskeletal: Negative.   Skin: Negative.   Neurological: Positive  for weakness.  Psychiatric/Behavioral: Negative.   All other systems reviewed and are negative.    Physical Exam Updated Vital Signs BP (!) 182/80   Pulse 62   Temp 99.6 F (37.6 C) (Oral)   Resp (!) 22   SpO2 96%   Physical Exam  Constitutional: She appears well-developed and well-nourished.  HENT:  Head: Normocephalic and atraumatic.  Eyes: Pupils are equal, round, and reactive to light. Conjunctivae are normal.  Neck: Neck supple. No tracheal deviation present. No thyromegaly present.  Cardiovascular: Normal rate and regular rhythm.  No murmur heard. Pulmonary/Chest: Effort normal and breath sounds normal.  Abdominal: Soft. Bowel sounds are normal. She exhibits no distension. There is no tenderness.  Musculoskeletal: Normal range of motion. She exhibits no edema or tenderness.  Neurological: She is alert. Coordination normal.  Skin: Skin is warm and dry. No rash noted.  Psychiatric: She has a normal mood and affect.  Nursing note and vitals reviewed.    ED Treatments / Results  Labs (all labs ordered are listed, but only abnormal results are displayed) Labs Reviewed  BASIC METABOLIC PANEL - Abnormal; Notable for the following components:      Result Value   CO2 21 (*)    Glucose, Bld 103 (*)    All other components within normal limits  URINALYSIS, ROUTINE W REFLEX MICROSCOPIC - Abnormal; Notable for the following components:   Color, Urine COLORLESS (*)    Specific Gravity, Urine 1.002 (*)    All other components within normal limits  CBC  I-STAT TROPONIN, ED    EKG EKG Interpretation  Date/Time:  Saturday March 26 2018 08:02:58 EDT Ventricular Rate:  66 PR Interval:  178 QRS Duration: 80 QT Interval:  418 QTC Calculation: 438 R Axis:   -17 Text Interpretation:  Sinus rhythm with occasional Premature ventricular complexes Nonspecific ST abnormality Abnormal ECG Premature ventricular complexes New since previous tracing Confirmed by Orlie Dakin  [phone]) on 03/26/2018 9:47:48 AM  Chest x-ray reviewed by me Radiology Dg Chest 2 View  Result Date: 03/26/2018 CLINICAL DATA:  Chest pain EXAM: CHEST - 2 VIEW COMPARISON:  09/20/2016 FINDINGS: Normal heart size and mediastinal contours. Artifact from EKG leads. Chronic interstitial coarsening. There is no edema, consolidation, effusion, or pneumothorax. IMPRESSION: No evidence of active disease.  Stable from 2017. Electronically Signed   By: Monte Fantasia M.D.   On: 03/26/2018 08:58   Chest x-ray viewed by me Procedures Procedures (including critical care time)  Medications Ordered in ED Medications - No data to display Results for orders placed or performed during the hospital encounter of 58/52/77  Basic metabolic panel  Result Value Ref Range   Sodium 137 135 - 145 mmol/L   Potassium 3.8 3.5 - 5.1 mmol/L   Chloride 103 101 - 111 mmol/L   CO2 21 (L) 22 - 32 mmol/L   Glucose, Bld 103 (H) 65 - 99 mg/dL   BUN  8 6 - 20 mg/dL   Creatinine, Ser 0.60 0.44 - 1.00 mg/dL   Calcium 9.2 8.9 - 10.3 mg/dL   GFR calc non Af Amer >60 >60 mL/min   GFR calc Af Amer >60 >60 mL/min   Anion gap 13 5 - 15  CBC  Result Value Ref Range   WBC 8.7 4.0 - 10.5 K/uL   RBC 4.94 3.87 - 5.11 MIL/uL   Hemoglobin 13.7 12.0 - 15.0 g/dL   HCT 41.5 36.0 - 46.0 %   MCV 84.0 78.0 - 100.0 fL   MCH 27.7 26.0 - 34.0 pg   MCHC 33.0 30.0 - 36.0 g/dL   RDW 13.2 11.5 - 15.5 %   Platelets 290 150 - 400 K/uL  Urinalysis, Routine w reflex microscopic  Result Value Ref Range   Color, Urine COLORLESS (A) YELLOW   APPearance CLEAR CLEAR   Specific Gravity, Urine 1.002 (L) 1.005 - 1.030   pH 8.0 5.0 - 8.0   Glucose, UA NEGATIVE NEGATIVE mg/dL   Hgb urine dipstick NEGATIVE NEGATIVE   Bilirubin Urine NEGATIVE NEGATIVE   Ketones, ur NEGATIVE NEGATIVE mg/dL   Protein, ur NEGATIVE NEGATIVE mg/dL   Nitrite NEGATIVE NEGATIVE   Leukocytes, UA NEGATIVE NEGATIVE  I-stat troponin, ED  Result Value Ref Range   Troponin i,  poc 0.01 0.00 - 0.08 ng/mL   Comment 3           Dg Chest 2 View  Result Date: 03/26/2018 CLINICAL DATA:  Chest pain EXAM: CHEST - 2 VIEW COMPARISON:  09/20/2016 FINDINGS: Normal heart size and mediastinal contours. Artifact from EKG leads. Chronic interstitial coarsening. There is no edema, consolidation, effusion, or pneumothorax. IMPRESSION: No evidence of active disease.  Stable from 2017. Electronically Signed   By: Monte Fantasia M.D.   On: 03/26/2018 08:58    Initial Impression / Assessment and Plan / ED Course  I have reviewed the triage vital signs and the nursing notes.  Pertinent labs & imaging results that were available during my care of the patient were reviewed by me and considered in my medical decision making (see chart for details).     10:40 AM patient asymptomatic, pain-free Nitroglycerin paste ordered 11:55 AM patient remains comfortable Symptoms concerning for unstable angina.  I  consulted Dr. Meda Coffee from cardiology service who will evaluate patient in ED   Final Clinical Impressions(s) / ED Diagnoses  Dx #1unstable angina #2 elevated blood pressure Final diagnoses:  None    ED Discharge Orders    None       Orlie Dakin, MD 03/26/18 1057 CRITICAL CARE Performed by: Orlie Dakin Total critical care time: 30 minutes Critical care time was exclusive of separately billable procedures and treating other patients. Critical care was necessary to treat or prevent imminent or life-threatening deterioration. Critical care was time spent personally by me on the following activities: development of treatment plan with patient and/or surrogate as well as nursing, discussions with consultants, evaluation of patient's response to treatment, examination of patient, obtaining history from patient or surrogate, ordering and performing treatments and interventions, ordering and review of laboratory studies, ordering and review of radiographic studies, pulse oximetry  and re-evaluation of patient's condition.   Orlie Dakin, MD 03/26/18 [phone]

## 2018-03-26 NOTE — ED Notes (Signed)
Pt lying on stretcher w/head of bed elevated - talking w/family. nad.

## 2018-03-26 NOTE — ED Notes (Signed)
Family arrived to bedside - waiting on pt to return.

## 2018-03-26 NOTE — ED Notes (Signed)
Pt arrived to [facility] via stretcher. Pt alert, oriented, calm, cooperative. Monitor intact to pt.

## 2018-03-26 NOTE — ED Notes (Signed)
Hooked patient up to the monitor patient is now going to CT

## 2018-03-26 NOTE — H&P (Addendum)
History & Physical    Patient ID: Alejandra Singh MRN: [id number], DOB/AGE: [birth year]   Admit date: 03/26/2018   Primary Physician: [facility] [place] Primary Cardiologist: Rozann Lesches, MD  Patient Profile    77 year old female with a history of PSVT, nonobstructive CAD, hyperlipidemia, hypothyroidism, GERD, iron deficiency anemia, and osteoporosis, who presented to the emergency department on June 8 with a one-month history of progressively more frequent rest and exertional chest and throat discomfort.  Past Medical History    Past Medical History:  Diagnosis Date  . FH: colonic polyps   . GERD (gastroesophageal reflux disease)   . Hyperlipidemia   . Hypothyroidism   . Iron deficiency anemia   . Non-obstructive CAD (coronary artery disease)    a. 08/2015 ETT: Ex time 6:18. No ST/T changes;  b. 09/2016 Cath: LM nl, LAD 50d, small bridging, LCX nl, RCA nl, EF 55-65%-->Med Rx.  . Osteoporosis   . 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    Past Surgical History:  Procedure Laterality Date  . broken ankle    . CARDIAC CATHETERIZATION N/A 09/22/2016   Procedure: Left Heart Cath and Coronary Angiography;  Surgeon: Leonie Man, MD;  Location: [facility];  Service: Cardiovascular;  Laterality: N/A;  . COLONOSCOPY WITH PROPOFOL N/A 07/05/2015   Procedure: COLONOSCOPY WITH PROPOFOL;  Surgeon: Manya Silvas, MD;  Location: [facility] ENDOSCOPY;  Service: Endoscopy;  Laterality: N/A;  . DILATION AND CURETTAGE OF UTERUS    . ESOPHAGOGASTRODUODENOSCOPY (EGD) WITH PROPOFOL N/A 03/19/2016   Procedure: ESOPHAGOGASTRODUODENOSCOPY (EGD) WITH PROPOFOL;  Surgeon: Manya Silvas, MD;  Location: [facility] ENDOSCOPY;  Service: Endoscopy;  Laterality: N/A;  . TONSILLECTOMY       Allergies  Allergies  Allergen Reactions  . Alendronate     History of Present Illness    77 year old female with the  above past medical history including PSVT, nonobstructive CAD, hyperlipidemia, hypothyroidism, GERD, iron deficiency anemia, and osteoporosis.  She was previously evaluated with stress testing in 2016 in the setting of chest pain.  This showed good exercise tolerance and no acute ST or T changes.  In December 2017, due to intermittent chest pain, she underwent diagnostic catheterization which revealed a 50% distal LAD stenosis and otherwise normal coronary arteries.  During catheterization, she had catheter induced SVT and was felt that symptoms might be related to SVT and microvascular ischemia.  She has been managed with beta-blocker therapy and has done well over the years.  She was last seen by Dr. Domenic Polite in April, at which time she was doing well.  About a month ago however, she began to experience intermittent rest and exertional substernal and throat discomfort without associated symptoms occurring 1-2 times per day lasting about 20 minutes, and resolving spontaneously.  She says that it does not necessarily hurt neck she describes it as a "weakness" in her chest and throat.  It is a different discomfort than what she experienced prior to catheterization in 2017.  Over the past month, symptoms have increased in frequency and are now occurring multiple times per day.  This morning, she got up and was getting dressed in preparation to go to a family member's graduation at the [facility], when she developed recurrent substernal and throat discomfort without associated symptoms.  This persisted longer than usual and when she checked her BP, it was >200, thus prompting her to come into the emergency department.  Here, ECG  is nonacute.  On telemetry, she is having frequent PVCs and bigeminy at times.  Initial troponin is normal.  Chest x-ray shows no active disease.  She continues to have mild discomfort.  Describes a dull discomfort.  Home Medications    Prior to Admission medications   Medication  Sig Start Date End Date Taking? Authorizing Provider  esomeprazole (NEXIUM) 40 MG capsule Take 40 mg by mouth daily at 12 noon.    [provider]  levothyroxine (SYNTHROID, LEVOTHROID) 50 MCG tablet Take 50 mcg by mouth daily.      [provider]  metoprolol tartrate (LOPRESSOR) 25 MG tablet Take 0.5 tablets (12.5 mg total) by mouth 2 (two) times daily. 11/15/17   Satira Sark, MD  Multiple Vitamins-Minerals (MULTIVITAL) tablet Take 1 tablet by mouth daily.      [provider]  Omega-3 Fatty Acids (FISH OIL) 1000 MG CAPS Take by mouth daily.      [provider]  PROBIOTIC CAPS Take by mouth daily.      [provider]  simvastatin (ZOCOR) 20 MG tablet Take 1 tablet (20 mg total) by mouth daily. 02/06/16   Satira Sark, MD    Family History    Family History  Problem Relation Age of Onset  . Stroke Father   . Alzheimer's disease Mother   . Dementia Mother   . Coronary artery disease Neg Hx    indicated that her mother is deceased. She indicated that her father is deceased. She indicated that the status of her neg hx is unknown.   Social History    Social History   Socioeconomic History  . Marital status: Married    Spouse name: Not on file  . Number of children: Not on file  . Years of education: Not on file  . Highest education level: Not on file  Occupational History  . Occupation: Retired - Science writer  . Financial resource strain: Not on file  . Food insecurity:    Worry: Not on file    Inability: Not on file  . Transportation needs:    Medical: Not on file    Non-medical: Not on file  Tobacco Use  . Smoking status: Never Smoker  . Smokeless tobacco: Never Used  Substance and Sexual Activity  . Alcohol use: No  . Drug use: No  . Sexual activity: Not on file  Lifestyle  . Physical activity:    Days per week: Not on file    Minutes per session: Not on file  . Stress: Not on file    Relationships  . Social connections:    Talks on phone: Not on file    Gets together: Not on file    Attends religious service: Not on file    Active member of club or organization: Not on file    Attends meetings of clubs or organizations: Not on file    Relationship status: Not on file  . Intimate partner violence:    Fear of current or ex partner: Not on file    Emotionally abused: Not on file    Physically abused: Not on file    Forced sexual activity: Not on file  Other Topics Concern  . Not on file  Social History Narrative   Lives in [place] with husband.  Does not routinely exercise.     Review of Systems    General:  No chills, fever, night sweats or weight changes.  Cardiovascular: She  describes a dull discomfort in her chest and throat as outlined above.  This has been increasing in frequency.  She actually denies pain.  No dyspnea on exertion, edema, orthopnea, palpitations, paroxysmal nocturnal dyspnea. Dermatological: No rash, lesions/masses Respiratory: No cough, dyspnea Urologic: No hematuria, dysuria Abdominal:   No nausea, vomiting, diarrhea, bright red blood per rectum, melena, or hematemesis Neurologic:  No visual changes, wkns, changes in mental status. All other systems reviewed and are otherwise negative except as noted above.  Physical Exam    Blood pressure 106/80, pulse (!) 31, temperature 99.6 F (37.6 C), temperature source Oral, resp. rate 14, SpO2 98 %.  General: Pleasant, NAD Psych: Normal affect. Neuro: Alert and oriented X 3. Moves all extremities spontaneously. HEENT: Normal  Neck: Supple without bruits or JVD. Lungs:  Resp regular and unlabored, CTA. Heart: Irregular in the setting of frequent ectopy.  No s3, s4, or murmurs. Abdomen: Soft, non-tender, non-distended, BS + x 4.  Extremities: No clubbing, cyanosis or edema. DP/PT/Radials 2+ and equal bilaterally.  Labs    Troponin [facility] Test) Recent Labs    03/26/18 0833   TROPIPOC 0.01    Lab Results  Component Value Date   WBC 8.7 03/26/2018   HGB 13.7 03/26/2018   HCT 41.5 03/26/2018   MCV 84.0 03/26/2018   PLT 290 03/26/2018    Recent Labs  Lab 03/26/18 0830  NA 137  K 3.8  CL 103  CO2 21*  BUN 8  CREATININE 0.60  CALCIUM 9.2  GLUCOSE 103*   Lab Results  Component Value Date   CHOL 200 06/18/2014   HDL 61 06/18/2014   LDLCALC 103 (H) 06/18/2014   TRIG 179 (H) 06/18/2014     Radiology Studies    Dg Chest 2 View  Result Date: 03/26/2018 CLINICAL DATA:  Chest pain EXAM: CHEST - 2 VIEW COMPARISON:  09/20/2016 FINDINGS: Normal heart size and mediastinal contours. Artifact from EKG leads. Chronic interstitial coarsening. There is no edema, consolidation, effusion, or pneumothorax. IMPRESSION: No evidence of active disease.  Stable from 2017. Electronically Signed   By: Monte Fantasia M.D.   On: 03/26/2018 08:58    ECG & Cardiac Imaging    Regular sinus rhythm, 66, left axis deviation, left anterior fascicular block, PVCs, minimal ST depression in V5 and V6-similar to prior ECG in April 2019.  Assessment & Plan    1.  Chest and throat discomfort/nonobstructive CAD: Patient presents with a one-month history of progressive intermittent rest and exertional substernal and throat discomfort, typically lasting up to 20 minutes but occurring more frequently and lasting longer this morning.  She actually denies pain or dyspnea but simply says it feels as though she has a "weakness" in her chest.  She is having frequent PVCs on telemetry and I suspect this may be contributing to her symptoms.  Initial troponin is normal.  ECG without acute ST or T changes.  Plan to observe overnight and cycle cardiac markers.  Follow telemetry.  Follow-up echocardiogram in the setting of PVCs and consider inpatient versus outpatient ischemic testing versus cardiac CTA.  Continue beta-blocker and statin.  We will add aspirin.  2.  PVCs: I suspect these may be  contributing to patient's symptoms.  She was experiencing bigeminy during my exam.  She does not clearly feel each PVC but question if overall burden is contributing to symptoms.  Continue beta-blocker.  Potassium within normal limits.  Follow-up magnesium and TSH.  Follow-up echo as above.  3.  Essential hypertension: Blood pressure markedly elevated at home this morning which prompted her to come to the ED.  Was 182 here.  Currently systolic is 836.  Will titrate home dose of metoprolol as tolerated (bp typically 130 @ home).  Follow.  4.  Hyperlipidemia: LDL 103 in 2015.  Follow-up.  Continue statin.  5.  History of PSVT: No known recurrence.  Continue beta-blocker.  Signed, Murray Hodgkins, NP 03/26/2018, 11:36 AM  The patient was seen, examined and discussed with Ignacia Bayley, NP and agree as above.  77 year old female with the above past medical history including PSVT, nonobstructive CAD, hyperlipidemia, hypothyroidism, GERD, iron deficiency anemia, and osteoporosis.  She underwent cardiac catheterization in December 2017, due to intermittent chest pain, which revealed a 50% distal LAD stenosis and otherwise normal coronary arteries.  During catheterization, she had catheter induced SVT and was felt that symptoms might be related to SVT and microvascular ischemia.  She has been managed with beta-blocker therapy and has done well over the years.  She was last seen by Dr. Domenic Polite in April, at which time she was doing well.    The patient states that for the last 2 months she has been feeling constant pressure on her chest radiating to her neck, this was not related to activity or laying flat, she denies any palpitation dizziness presyncope or syncope.  No lower extremity edema.  She has been compliant with her medications.  Today her feelings were worse, so she came to the ER.    On arrival blood pressure 629 systolic, her symptoms slightly improved with blood pressure management with  Nitropaste, it has been noticed that she has very frequent unifocal PVCs on telemetry, most of the time is bigeminy followed by few seconds of normal sinus rhythm alternating with bigeminy.  On physical exam she seems to be comfortable, she has no elevated JVDs, no obvious murmur on physical exam, she has irregular heartbeat secondary to PVCs, her lungs are clear, no lower extremity edema warm lower extremities with good pulses.  Considering known moderate nonobstructive CAD, we will proceed with coronary CTA to reevaluate LAD lesion, focus on blood pressure management, repeat echocardiogram.  Her labs were otherwise okay, potassium 3.8, magnesium 2.1, creatinine 0.6, hemoglobin 13.7.  For now I will add amlodipine 2.5 mg daily for blood pressure control.  She is on simvastatin 20 mg daily at home, on no aspirin possibly because of G GERD and iron deficiency anemia.  Ena Dawley, MD 03/26/2018

## 2018-03-26 NOTE — Progress Notes (Signed)
New pt admission from ED. Pt brought to the floor in stable condition. Vitals taken. Initial Assessment done. All immediate pertinent needs to patient addressed. Patient Guide given to patient. Important safety instructions relating to hospitalization reviewed with patient. Patient verbalized understanding. Will continue to monitor pt.  Zuri Bradway, RN 

## 2018-03-26 NOTE — ED Notes (Signed)
Transported to CT - SWAT RN, Rip Harbour, w/pt.

## 2018-03-26 NOTE — ED Triage Notes (Signed)
Pt. Stated, Donnald Garre had some discomfort in my chest that's in my chest into my neck, Im not really sure what it is. It also feels like the breath is just not making it all the way.  I thought maybe its my thyroid. This has been golng on for 2 weeks.

## 2018-03-26 NOTE — ED Notes (Signed)
Pt returned from CT. Monitor intact to pt. Pt alert, oriented.

## 2018-03-26 NOTE — Progress Notes (Signed)
Pt's Cardiac rthytthm shows bigeminy PVC's frequent, verified with CCMD, cardiologist paged, pt is asymptomatic, will continue to monitor  Palma Holter, RN

## 2018-03-27 ENCOUNTER — Observation Stay (HOSPITAL_BASED_OUTPATIENT_CLINIC_OR_DEPARTMENT_OTHER): Payer: Medicare Other

## 2018-03-27 DIAGNOSIS — I10 Essential (primary) hypertension: Secondary | ICD-10-CM

## 2018-03-27 DIAGNOSIS — E782 Mixed hyperlipidemia: Secondary | ICD-10-CM | POA: Diagnosis not present

## 2018-03-27 DIAGNOSIS — I371 Nonrheumatic pulmonary valve insufficiency: Secondary | ICD-10-CM

## 2018-03-27 DIAGNOSIS — R0789 Other chest pain: Secondary | ICD-10-CM | POA: Diagnosis not present

## 2018-03-27 DIAGNOSIS — I340 Nonrheumatic mitral (valve) insufficiency: Secondary | ICD-10-CM

## 2018-03-27 DIAGNOSIS — I25110 Atherosclerotic heart disease of native coronary artery with unstable angina pectoris: Secondary | ICD-10-CM | POA: Diagnosis not present

## 2018-03-27 DIAGNOSIS — I493 Ventricular premature depolarization: Secondary | ICD-10-CM | POA: Diagnosis not present

## 2018-03-27 DIAGNOSIS — I051 Rheumatic mitral insufficiency: Secondary | ICD-10-CM | POA: Diagnosis not present

## 2018-03-27 LAB — ECHOCARDIOGRAM COMPLETE
HEIGHTINCHES: 65 in
WEIGHTICAEL: 2377.44 [oz_av]

## 2018-03-27 LAB — TROPONIN I: Troponin I: <0.03 ng/mL (ref <0.03)

## 2018-03-27 MED ORDER — FLECAINIDE ACETATE 50 MG PO TABS
50.0000 mg | ORAL_TABLET | Freq: Two times a day (BID) | ORAL | Status: DC
  Administered 2018-03-27 – 2018-03-28 (×3): 50 mg via ORAL
  Filled 2018-03-27 (×4): qty 1

## 2018-03-27 MED ORDER — METOPROLOL TARTRATE 12.5 MG HALF TABLET
12.5000 mg | ORAL_TABLET | Freq: Two times a day (BID) | ORAL | Status: DC
  Administered 2018-03-27 – 2018-03-28 (×2): 12.5 mg via ORAL
  Filled 2018-03-27 (×2): qty 1

## 2018-03-27 NOTE — Progress Notes (Signed)
  Echocardiogram 2D Echocardiogram has been performed.  Johny Chess 03/27/2018, 3:31 PM

## 2018-03-27 NOTE — Progress Notes (Signed)
Pt ambulated in a hallway, Family members in bed side. Echo is done and result pending, denies CP and SOB, will continue to monitor the patient  Palma Holter, RN

## 2018-03-27 NOTE — Progress Notes (Addendum)
Progress Note  Patient Name: Alejandra Singh Date of Encounter: 03/27/2018  Primary Cardiologist: Rozann Lesches, MD   Subjective   The patient feels minimally better today.  Inpatient Medications    Scheduled Meds: . amLODipine  2.5 mg Oral Daily  . aspirin EC  81 mg Oral Daily  . heparin  5,000 Units Subcutaneous Q8H  . levothyroxine  50 mcg Oral QAC breakfast  . metoprolol tartrate  25 mg Oral BID  . multivitamin with minerals  1 tablet Oral Daily  . nitroGLYCERIN  0.5 inch Topical Q6H  . pantoprazole  40 mg Oral Daily  . simvastatin  20 mg Oral Daily   Continuous Infusions:  PRN Meds: acetaminophen, nitroGLYCERIN, ondansetron (ZOFRAN) IV   Vital Signs    Vitals:   03/27/18 0424 03/27/18 0708 03/27/18 0815 03/27/18 0913  BP: 119/70  135/79   Pulse: (!) 53 (!) 55 (!) 57 60  Resp: 19  18   Temp: 98.1 F (36.7 C)  97.8 F (36.6 C)   TempSrc: Oral  Oral   SpO2: 99%  94%   Weight: 148 lb 9.4 oz (67.4 kg)     Height:        Intake/Output Summary (Last 24 hours) at 03/27/2018 1035 Last data filed at 03/27/2018 0443 Gross per 24 hour  Intake 200 ml  Output 800 ml  Net -600 ml   Filed Weights   03/26/18 1551 03/27/18 0424  Weight: 149 lb 12.8 oz (67.9 kg) 148 lb 9.4 oz (67.4 kg)    Telemetry    SR, PVCs in a pattern of trigeminy- Personally Reviewed  ECG    Sinus rhythm, with bigeminy- Personally Reviewed  Physical Exam   GEN: No acute distress.   Neck: No JVD Cardiac: RRR, no murmurs, rubs, or gallops.  Respiratory: Clear to auscultation bilaterally. GI: Soft, nontender, non-distended  MS: No edema; No deformity. Neuro:  Nonfocal  Psych: Normal affect   Labs    Chemistry Recent Labs  Lab 03/26/18 0830 03/26/18 1205  NA 137  --   K 3.8  --   CL 103  --   CO2 21*  --   GLUCOSE 103*  --   BUN 8  --   CREATININE 0.60 0.61  CALCIUM 9.2  --   GFRNONAA >60 >60  GFRAA >60 >60  ANIONGAP 13  --      Hematology Recent Labs  Lab  03/26/18 0830 03/26/18 1817  WBC 8.7 6.9  RBC 4.94 4.76  HGB 13.7 13.3  HCT 41.5 40.1  MCV 84.0 84.2  MCH 27.7 27.9  MCHC 33.0 33.2  RDW 13.2 13.2  PLT 290 266    Cardiac Enzymes Recent Labs  Lab 03/26/18 1347 03/26/18 1817 03/26/18 2359  TROPONINI <0.03 <0.03 <0.03    Recent Labs  Lab 03/26/18 0833  TROPIPOC 0.01     BNPNo results for input(s): BNP, PROBNP in the last 168 hours.   DDimer No results for input(s): DDIMER in the last 168 hours.   Radiology    Dg Chest 2 View  Result Date: 03/26/2018 CLINICAL DATA:  Chest pain EXAM: CHEST - 2 VIEW COMPARISON:  09/20/2016 FINDINGS: Normal heart size and mediastinal contours. Artifact from EKG leads. Chronic interstitial coarsening. There is no edema, consolidation, effusion, or pneumothorax. IMPRESSION: No evidence of active disease.  Stable from 2017. Electronically Signed   By: Monte Fantasia M.D.   On: 03/26/2018 08:58   Ct Cardiac Morph/pulm Vein W/cm&w/o Ca  Score  Result Date: 03/26/2018 CLINICAL DATA:  77 year old female with atypical chest pain and known non-obstructive CAD. EXAM: Cardiac/Coronary  CT TECHNIQUE: The patient was scanned on a [employer]. FINDINGS: A 120 kV prospective scan was triggered in the descending thoracic aorta at 111 HU's. Axial non-contrast 3 mm slices were carried out through the heart. The data set was analyzed on a dedicated work station and scored using the [place]. Gantry rotation speed was 250 msecs and collimation was .6 mm. No beta blockade and 0.4 mg of sl NTG was given. The 3D data set was reconstructed in 5% intervals of the 67-82 % of the R-R cycle. Diastolic phases were analyzed on a dedicated work station using MPR, MIP and VRT modes. The patient received 80 cc of contrast. Aorta:  Normal size.  No calcifications.  No dissection. Aortic Valve:  Trileaflet.  No calcifications. Coronary Arteries:  Normal coronary origin.  Right dominance. RCA is a large dominant artery  that gives rise to PDA and PLVB. There is minimal plaque. Left main is a large artery that gives rise to LAD and LCX arteries. Left main has no plaque. LAD is a large vessel that gives rise to two small diagonal arteries. There is minimal calcified plaque in the proximal LAD with associated stenosis 0-25%. There is mild calcified plaque in the proximal to mid LAD with stenosis 25-50%. Mid to distal LAD has no plaque. D1 and D2 are small arteries with no significant stenosis. LCX is a non-dominant artery that gives rise to one large OM1 branch. There is no plaque. Other findings: Normal pulmonary vein drainage into the left atrium. Normal let atrial appendage without a thrombus. Normal size of the pulmonary artery. IMPRESSION: 1. Coronary calcium score of 0. This was 0 percentile for age and sex matched control. 2. Normal coronary origin with right dominance. 3. Mild CAD in the proximal to mid LAD with stenosis 25-50%. Continue aggressive medical management. Electronically Signed   By: Ena Dawley   On: 03/26/2018 17:07    Cardiac Studies   Echo is pending  Patient Profile     77 y.o. female   [place]    1.  Chest and throat discomfort/nonobstructive CAD: Coronary CTA showed mild nonobstructive CAD and a proximal to mid LAD with stenosis of 25 to 50%, this was confirmed by CT FFR.  2.  PVCs: I suspect these may be contributing to patient's symptoms.  She was experiencing bigeminy during my exam.  Her metoprolol was increased to 25 mg p.o. twice daily, now telemetry mostly shows trigeminy, she has minimal improvement of symptoms, echo is still pending. I have discussed this with Dr. Caryl Comes, he recommends to start Flecainide 50 mg PO BID and decrease metoprolol to 12.5 mg PO BID or discontinue. Continue beta-blocker.  Potassium within normal limits.    Magnesium and TSH were normal as well.  3.  Essential hypertension: Blood pressure markedly elevated at home this morning which  prompted her to come to the ED.   these has significantly improved with initiation of amlodipine 2.5 daily.  Now normal.  4.  Hyperlipidemia: LDL 103 in 2015.  Follow-up.  Continue statin.  5.  History of PSVT: No known recurrence.  Continue beta-blocker.  For questions or updates, please contact [place] Please consult [email or web address] for contact info under Cardiology/STEMI.      Signed, Ena Dawley, MD  03/27/2018, 10:35 AM

## 2018-03-27 NOTE — Plan of Care (Signed)
  Problem: Nutrition: Goal: Adequate nutrition will be maintained Outcome: Completed/Met   Problem: Coping: Goal: Level of anxiety will decrease Outcome: Completed/Met   Problem: Pain Managment: Goal: General experience of comfort will improve Outcome: Completed/Met   Problem: Skin Integrity: Goal: Risk for impaired skin integrity will decrease Outcome: Completed/Met   

## 2018-03-27 NOTE — Progress Notes (Signed)
MD notified regarding NPO status and changed her diet, ordered breakfast, morning medicines given, pt was reluctant to take 25mg  metoprolol, double checked with MD, Family members in bed side and is waiting for the doctor  Palma Holter, RN

## 2018-03-28 ENCOUNTER — Other Ambulatory Visit: Payer: Self-pay | Admitting: Medical

## 2018-03-28 DIAGNOSIS — I2510 Atherosclerotic heart disease of native coronary artery without angina pectoris: Secondary | ICD-10-CM

## 2018-03-28 DIAGNOSIS — I25110 Atherosclerotic heart disease of native coronary artery with unstable angina pectoris: Secondary | ICD-10-CM | POA: Diagnosis not present

## 2018-03-28 DIAGNOSIS — I493 Ventricular premature depolarization: Secondary | ICD-10-CM

## 2018-03-28 DIAGNOSIS — E782 Mixed hyperlipidemia: Secondary | ICD-10-CM | POA: Diagnosis not present

## 2018-03-28 DIAGNOSIS — I10 Essential (primary) hypertension: Secondary | ICD-10-CM | POA: Diagnosis not present

## 2018-03-28 DIAGNOSIS — I051 Rheumatic mitral insufficiency: Secondary | ICD-10-CM | POA: Diagnosis not present

## 2018-03-28 DIAGNOSIS — R0789 Other chest pain: Secondary | ICD-10-CM | POA: Diagnosis not present

## 2018-03-28 MED ORDER — AMLODIPINE BESYLATE 2.5 MG PO TABS: 2.5000 mg | ORAL_TABLET | Freq: Every day | ORAL | 3 refills | Status: DC

## 2018-03-28 MED ORDER — FLECAINIDE ACETATE 50 MG PO TABS: 50.0000 mg | ORAL_TABLET | Freq: Two times a day (BID) | ORAL | 3 refills | Status: DC

## 2018-03-28 MED ORDER — ASPIRIN 81 MG PO TBEC: 81.0000 mg | DELAYED_RELEASE_TABLET | Freq: Every day | ORAL | 3 refills | Status: DC

## 2018-03-28 NOTE — Discharge Summary (Signed)
Discharge Summary    Patient ID: Alejandra Singh,  MRN: [id number], DOB/AGE: 76/13/1942 76 y.o.  Admit date: 03/26/2018 Discharge date: 03/28/2018  Primary Care Provider: [facility] Primary Cardiologist: Rozann Lesches, MD  Discharge Diagnoses    Principal Problem:   Chest discomfort Active Problems:   Mixed hyperlipidemia   Essential hypertension, benign   Coronary artery calcification seen on CAT scan   Frequent PVCs   Allergies Allergies  Allergen Reactions  . Alendronate     Diagnostic Studies/Procedures    Echocardiogram 03/27/18: Study Conclusions  - Left ventricle: The cavity size was normal. Wall thickness was   normal. Systolic function was normal. The estimated ejection   fraction was in the range of 50% to 55%. - Mitral valve: There was mild regurgitation.  Coronary CTA 03/26/18: IMPRESSION: 1. Coronary calcium score of 0. This was 0 percentile for age and sex matched control.  2. Normal coronary origin with right dominance.  3. Mild CAD in the proximal to mid LAD with stenosis 25-50%. Continue aggressive medical management. _____________   History of Present Illness     77 year old female with past medical history including PSVT, nonobstructive CAD, hyperlipidemia, hypothyroidism, GERD, iron deficiency anemia, and osteoporosis.  She was previously evaluated with stress testing in 2016 in the setting of chest pain.  This showed good exercise tolerance and no acute ST or T changes.  In December 2017, due to intermittent chest pain, she underwent diagnostic catheterization which revealed a 50% distal LAD stenosis and otherwise normal coronary arteries.  During catheterization, she had catheter induced SVT and was felt that symptoms might be related to SVT and microvascular ischemia.  She has been managed with beta-blocker therapy and has done well over the years.  She was last seen by Dr. Domenic Polite in April, at which time she was doing  well.  About a month ago however, she began to experience intermittent rest and exertional substernal and throat discomfort without associated symptoms occurring 1-2 times per day lasting about 20 minutes, and resolving spontaneously.  She says that it does not necessarily hurt, but she describes it as a "weakness" in her chest and throat.  It is a different discomfort than what she experienced prior to catheterization in 2017.  Over the past month, symptoms have increased in frequency and are now occurring multiple times per day.  On the morning of 03/26/18, she got up and was getting dressed in preparation to go to a family member's graduation at the [facility], when she developed recurrent substernal and throat discomfort without associated symptoms.  This persisted longer than usual and when she checked her BP, it was >200, thus prompting her to come into the emergency department.  Here, ECG is nonacute.  On telemetry, she is having frequent PVCs and bigeminy at times.  Initial troponin is normal.  Chest x-ray shows no active disease.  She continues to have mild discomfort.  Describes a dull discomfort.    Hospital Course     Consultants: None   1. Chest discomfort in patient with non-obstructive CAD: patient presented with chest discomfort which had been occurring in increasing frequency over the past month, described as a substernal/throat discomfort. Troponin's were negative x3. EKG without ischemic changes, but did reveal frequent PVCs. Echo with EF 50-55% and mild mitral regurgitation. She underwent a Coronary CTA 03/26/18 which revealed a calcium score of 0, with mild CAD in the proximal to mid LAD with stenosis of 25-50%.  She was recommended for aggressive medical management. - She was started on ASA 81mg  daily - Continued on home statin and omega 3  2. HTN: BP elevated on presentation with SBP in 180s. Stated her BP typically runs in the 130s at home. Home metoprolol was continued. She  was started amlodipine 2.5mg  daily with improvement in BP. - Continue home metoprolol 12.5mg  BID and amlodipine 2.5mg  daily (new)  3. Frequent PVCs: noted to be in bigeminy/trigeminy frequently throughout admission. Suspected this was contributing to her chest discomfort. Case discussed with Dr. Caryl Comes who recommended starting flecainide 50mg  po BID. Patients PVC burden improved after initiation, as did her symptoms - Continued home metoprolol 12.5mg  BID and flecainide 50mg  BID (new) - Will arrange an outpatient POET for flecainide monitoring in 1 month.   4. Dyslipidemia: no recent statins - Continue home statin and omega 3  Outpatient follow-up with general cardiology and EP scheduled.    _____________  Discharge Vitals Blood pressure 133/74, pulse (!) 52, temperature 97.6 F (36.4 C), temperature source Oral, resp. rate 18, height 5\' 5"  (1.651 m), weight 148 lb 4.8 oz (67.3 kg), SpO2 94 %.  Filed Weights   03/26/18 1551 03/27/18 0424 03/28/18 0525  Weight: 149 lb 12.8 oz (67.9 kg) 148 lb 9.4 oz (67.4 kg) 148 lb 4.8 oz (67.3 kg)   Physical exam on the day of discharge:  NLZ:JQBHALP upright in no acute distress.   Neck:No JVD, no carotid bruits Cardiac: RRR, no murmurs, rubs, or gallops.  Respiratory:Clear to auscultation bilaterally, mild expiratory wheeze; no rales/ rhonchi FX:TKWI, Soft, nontender, non-distended  MS:No edema; No deformity. Neuro:Nonfocal, moving all extremities spontaneously Psych: Normal affect   Labs & Radiologic Studies    CBC Recent Labs    03/26/18 0830 03/26/18 1817  WBC 8.7 6.9  HGB 13.7 13.3  HCT 41.5 40.1  MCV 84.0 84.2  PLT 290 097   Basic Metabolic Panel Recent Labs    03/26/18 0830 03/26/18 1205  NA 137  --   K 3.8  --   CL 103  --   CO2 21*  --   GLUCOSE 103*  --   BUN 8  --   CREATININE 0.60 0.61  CALCIUM 9.2  --   MG  --  2.1   Liver Function Tests No results for input(s): AST, ALT, ALKPHOS, BILITOT, PROT, ALBUMIN  in the last 72 hours. No results for input(s): LIPASE, AMYLASE in the last 72 hours. Cardiac Enzymes Recent Labs    03/26/18 1347 03/26/18 1817 03/26/18 2359  TROPONINI <0.03 <0.03 <0.03   BNP Invalid input(s): POCBNP D-Dimer No results for input(s): DDIMER in the last 72 hours. Hemoglobin A1C No results for input(s): HGBA1C in the last 72 hours. Fasting Lipid Panel No results for input(s): CHOL, HDL, LDLCALC, TRIG, CHOLHDL, LDLDIRECT in the last 72 hours. Thyroid Function Tests Recent Labs    03/26/18 1817  TSH 3.275   _____________  Dg Chest 2 View  Result Date: 03/26/2018 CLINICAL DATA:  Chest pain EXAM: CHEST - 2 VIEW COMPARISON:  09/20/2016 FINDINGS: Normal heart size and mediastinal contours. Artifact from EKG leads. Chronic interstitial coarsening. There is no edema, consolidation, effusion, or pneumothorax. IMPRESSION: No evidence of active disease.  Stable from 2017. Electronically Signed   By: Monte Fantasia M.D.   On: 03/26/2018 08:58   Ct Cardiac Morph/pulm Vein W/cm&w/o Ca Score  Addendum Date: 03/28/2018   ADDENDUM REPORT: 03/28/2018 08:21 EXAM: OVER-READ INTERPRETATION  CT CHEST The following report  is an over-read performed by radiologist Dr. Rebekah Chesterfield [facility] on 03/28/2018. This over-read does not include interpretation of cardiac or coronary anatomy or pathology. The coronary calcium score and cardiac CTA interpretation by the cardiologist is attached. COMPARISON:  None. FINDINGS: Aortic atherosclerosis. Within the visualized portions of the thorax there are no suspicious appearing pulmonary nodules or masses, there is no acute consolidative airspace disease, no pleural effusions, no pneumothorax and no lymphadenopathy. Visualized portions of the upper abdomen are unremarkable. There are no aggressive appearing lytic or blastic lesions noted in the visualized portions of the skeleton. IMPRESSION: Aortic Atherosclerosis (ICD10-I70.0).  Electronically Signed   By: Vinnie Langton M.D.   On: 03/28/2018 08:21   Result Date: 03/28/2018 CLINICAL DATA:  77 year old female with atypical chest pain and known non-obstructive CAD. EXAM: Cardiac/Coronary  CT TECHNIQUE: The patient was scanned on a [employer]. FINDINGS: A 120 kV prospective scan was triggered in the descending thoracic aorta at 111 HU's. Axial non-contrast 3 mm slices were carried out through the heart. The data set was analyzed on a dedicated work station and scored using the [place]. Gantry rotation speed was 250 msecs and collimation was .6 mm. No beta blockade and 0.4 mg of sl NTG was given. The 3D data set was reconstructed in 5% intervals of the 67-82 % of the R-R cycle. Diastolic phases were analyzed on a dedicated work station using MPR, MIP and VRT modes. The patient received 80 cc of contrast. Aorta:  Normal size.  No calcifications.  No dissection. Aortic Valve:  Trileaflet.  No calcifications. Coronary Arteries:  Normal coronary origin.  Right dominance. RCA is a large dominant artery that gives rise to PDA and PLVB. There is minimal plaque. Left main is a large artery that gives rise to LAD and LCX arteries. Left main has no plaque. LAD is a large vessel that gives rise to two small diagonal arteries. There is minimal calcified plaque in the proximal LAD with associated stenosis 0-25%. There is mild calcified plaque in the proximal to mid LAD with stenosis 25-50%. Mid to distal LAD has no plaque. D1 and D2 are small arteries with no significant stenosis. LCX is a non-dominant artery that gives rise to one large OM1 branch. There is no plaque. Other findings: Normal pulmonary vein drainage into the left atrium. Normal let atrial appendage without a thrombus. Normal size of the pulmonary artery. IMPRESSION: 1. Coronary calcium score of 0. This was 0 percentile for age and sex matched control. 2. Normal coronary origin with right dominance. 3. Mild CAD in the  proximal to mid LAD with stenosis 25-50%. Continue aggressive medical management. Electronically Signed: By: Ena Dawley On: 03/26/2018 17:07   Disposition   Patient was seen and examined by Dr. Radford Pax who deemed patient as stable for discharge. Follow-up has been arranged. Discharge medications as listed below.   Follow-up Plans & Appointments    Follow-up Information    Erma Heritage, PA-C Follow up on 04/18/2018.   Specialties:  Physician Assistant, Cardiology Why:  Please arrive 15 minutes early for your 2pm post-hospitalization cardiology appointment Contact information: [address] [place]  [phone]        Deboraha Sprang, MD Follow up on 04/14/2018.   Specialty:  Cardiology Why:  The office will contact you directly prior to the appointment to instruct you on when to arrive. Please call [phone] if you do not receive a call within a few days  of the appointment Contact information: [address] [place] [place] [address] [phone]        [facility] Follow up on 04/27/2018.   Why:  Please arrive to general registration at 9am for your exercise tolerance test. This is for routine monitoring of your heart while on flecainide. Please wear comfortable clothing and shoes as you will be walking on a treadmill for this exam. Contact information: [address] [phone]         Discharge Instructions    Diet - low sodium heart healthy   Complete by:  As directed    Increase activity slowly   Complete by:  As directed       Discharge Medications   Allergies as of 03/28/2018      Reactions   Alendronate       Medication List    TAKE these medications   amLODipine 2.5 MG tablet Commonly known as:  NORVASC Take 1 tablet (2.5 mg total) by mouth daily. Start taking on:  03/29/2018   aspirin 81 MG EC tablet Take 1 tablet (81 mg total) by mouth daily. Start taking on:   03/29/2018   esomeprazole 40 MG capsule Commonly known as:  NEXIUM Take 40 mg by mouth as needed (reflux).   Fish Oil 1000 MG Caps Take 1,000 mg by mouth daily.   flecainide 50 MG tablet Commonly known as:  TAMBOCOR Take 1 tablet (50 mg total) by mouth 2 (two) times daily.   levothyroxine 50 MCG tablet Commonly known as:  SYNTHROID, LEVOTHROID Take 50 mcg by mouth daily.   metoprolol tartrate 25 MG tablet Commonly known as:  LOPRESSOR Take 0.5 tablets (12.5 mg total) by mouth 2 (two) times daily.   MULTIVITAL tablet Take 1 tablet by mouth daily.   simvastatin 20 MG tablet Commonly known as:  ZOCOR Take 1 tablet (20 mg total) by mouth daily. What changed:  when to take this       Outstanding Labs/Studies   Exercise tolerance test scheduled for 04/27/18 for flecainide monitoring.  Duration of Discharge Encounter   Greater than 30 minutes including physician time.  Signed, Abigail Butts PA-C 03/28/2018, 12:19 PM

## 2018-03-28 NOTE — Progress Notes (Signed)
Pt got discharged to home, discharge instructions provided and patient showed understanding to it, IV taken out,Telemonitor DC,pt left unit in wheelchair with all of the belongings accompanied with a family member (Husband) Jaimee Corum,RN  

## 2018-03-30 ENCOUNTER — Telehealth: Payer: Self-pay | Admitting: *Deleted

## 2018-03-30 NOTE — Telephone Encounter (Signed)
-----   Message from Orinda Kenner sent at 03/28/2018 10:50 AM EDT ----- Regarding: TOC TOC [place] pt to be d/c'd today. F/U on 04/18/18 w/ BS.  Thanks, [employer]

## 2018-03-30 NOTE — Telephone Encounter (Signed)
Patient contacted regarding discharge from [facility] on 03/18/18.  Patient understands to follow up with provider on  - 04/14/18 with Dr. Caryl Comes @ 1:30 pm at [facility]. - 04/18/18 with Bernerd Pho @ 2:00 pm at [facility] office. - 04/27/18 with GXT @ 9:30 am @ [facility]   Patient understands discharge instructions?  Yes  Patient understands medications and regiment?  Yes  Patient understands to bring all medications to this visit?  Yes  Other details:

## 2018-04-14 ENCOUNTER — Ambulatory Visit (INDEPENDENT_AMBULATORY_CARE_PROVIDER_SITE_OTHER): Payer: Medicare Other | Admitting: Internal Medicine

## 2018-04-14 ENCOUNTER — Encounter: Payer: Self-pay | Admitting: Internal Medicine

## 2018-04-14 VITALS — BP 128/82 | HR 53 | Ht 65.0 in | Wt 151.4 lb

## 2018-04-14 DIAGNOSIS — I2510 Atherosclerotic heart disease of native coronary artery without angina pectoris: Secondary | ICD-10-CM | POA: Diagnosis not present

## 2018-04-14 DIAGNOSIS — I493 Ventricular premature depolarization: Secondary | ICD-10-CM

## 2018-04-14 NOTE — Progress Notes (Signed)
ELECTROPHYSIOLOGY CONSULT NOTE  Patient ID: Alejandra Singh, MRN: [id number], DOB/AGE: Sep 01, 1941 77 y.o. Admit date: (Not on file) Date of Consult: 04/14/2018  Primary Physician: [facility] Primary Cardiologist: SMcD     Alejandra Singh is a 77 y.o. female who is being seen today for the evaluation of PVCs at the request of Dr Meda Coffee.    HPI Alejandra Singh is a 77 y.o. female referred for PVCs  Recently hospitalized 6/19 with chest pain.  She was found to have PVCs.  ECG 03/26/2018 demonstrated left bundle inferior axis PVCs of the tracings were notable for bradycardia.  Somebody asked me about her and suggested that they put her on low-dose flecainide.  She describes these as an easy sensation in her chest.  They are sporadic.  They are not necessarily associated with any triggers although she has decreased her caffeine.  They are sometimes accompanied by lightheadedness shortness of breath.  Following the initiation of flecainide, she has been much improved  DATE TEST EF   6/19 Echo   50-55 %   6/19/  CTA    % CA score 0  <25% lesion        It is her impression that she remains quite fatigued.  This was aggravated by the initiation of metoprolol for SVT which is been quiescient.    Past Medical History:  Diagnosis Date  . FH: colonic polyps   . GERD (gastroesophageal reflux disease)   . Hyperlipidemia   . Hypertension   . Hypothyroidism   . Iron deficiency anemia   . Non-obstructive CAD (coronary artery disease)    a. 08/2015 ETT: Ex time 6:18. No ST/T changes;  b. 09/2016 Cath: LM nl, LAD 50d, small bridging, LCX nl, RCA nl, EF 55-65%-->Med Rx.  . Osteoporosis   . 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      Surgical History:  Past Surgical History:  Procedure Laterality Date  . broken ankle    . CARDIAC CATHETERIZATION N/A 09/22/2016   Procedure: Left Heart Cath  and Coronary Angiography;  Surgeon: Leonie Man, MD;  Location: [place] CV LAB;  Service: Cardiovascular;  Laterality: N/A;  . COLONOSCOPY WITH PROPOFOL N/A 07/05/2015   Procedure: COLONOSCOPY WITH PROPOFOL;  Surgeon: Manya Silvas, MD;  Location: [facility] ENDOSCOPY;  Service: Endoscopy;  Laterality: N/A;  . DILATION AND CURETTAGE OF UTERUS    . ESOPHAGOGASTRODUODENOSCOPY (EGD) WITH PROPOFOL N/A 03/19/2016   Procedure: ESOPHAGOGASTRODUODENOSCOPY (EGD) WITH PROPOFOL;  Surgeon: Manya Silvas, MD;  Location: [facility] ENDOSCOPY;  Service: Endoscopy;  Laterality: N/A;  . TONSILLECTOMY       Home Meds: Prior to Admission medications   Medication Sig Start Date End Date Taking? Authorizing Provider  amLODipine (NORVASC) 2.5 MG tablet Take 1 tablet (2.5 mg total) by mouth daily. 03/29/18   Kroeger, Lorelee Cover., PA-C  aspirin EC 81 MG EC tablet Take 1 tablet (81 mg total) by mouth daily. 03/29/18   Kroeger, Lorelee Cover., PA-C  esomeprazole (NEXIUM) 40 MG capsule Take 40 mg by mouth as needed (reflux).     [provider]  flecainide (TAMBOCOR) 50 MG tablet Take 1 tablet (50 mg total) by mouth 2 (two) times daily. 03/28/18   Kroeger, Lorelee Cover., PA-C  levothyroxine (SYNTHROID, LEVOTHROID) 50 MCG tablet Take 50 mcg by mouth daily.      [provider]  metoprolol tartrate (LOPRESSOR) 25 MG tablet  Take 0.5 tablets (12.5 mg total) by mouth 2 (two) times daily. 11/15/17   Satira Sark, MD  Multiple Vitamins-Minerals (MULTIVITAL) tablet Take 1 tablet by mouth daily.      [provider]  Omega-3 Fatty Acids (FISH OIL) 1000 MG CAPS Take 1,000 mg by mouth daily.     [provider]  simvastatin (ZOCOR) 20 MG tablet Take 1 tablet (20 mg total) by mouth daily. Patient taking differently: Take 20 mg by mouth at bedtime.  02/06/16   Satira Sark, MD    Allergies:  Allergies  Allergen Reactions  . Alendronate     Social History   Socioeconomic History  . Marital  status: Married    Spouse name: Not on file  . Number of children: Not on file  . Years of education: Not on file  . Highest education level: Not on file  Occupational History  . Occupation: Retired - Science writer  . Financial resource strain: Not on file  . Food insecurity:    Worry: Not on file    Inability: Not on file  . Transportation needs:    Medical: Not on file    Non-medical: Not on file  Tobacco Use  . Smoking status: Never Smoker  . Smokeless tobacco: Never Used  Substance and Sexual Activity  . Alcohol use: No  . Drug use: No  . Sexual activity: Not on file  Lifestyle  . Physical activity:    Days per week: Not on file    Minutes per session: Not on file  . Stress: Not on file  Relationships  . Social connections:    Talks on phone: Not on file    Gets together: Not on file    Attends religious service: Not on file    Active member of club or organization: Not on file    Attends meetings of clubs or organizations: Not on file    Relationship status: Not on file  . Intimate partner violence:    Fear of current or ex partner: Not on file    Emotionally abused: Not on file    Physically abused: Not on file    Forced sexual activity: Not on file  Other Topics Concern  . Not on file  Social History Narrative   Lives in [place] with husband.  Does not routinely exercise.     Family History  Problem Relation Age of Onset  . Stroke Father   . Alzheimer's disease Mother   . Dementia Mother   . Coronary artery disease Neg Hx      ROS:  Please see the history of present illness.     All other systems reviewed and negative.    Physical Exam: Blood pressure 128/82, pulse (!) 53, height 5\' 5"  (1.651 m), weight 151 lb 6.4 oz (68.7 kg), SpO2 98 %. General: Well developed, well nourished female in no acute distress. Head: Normocephalic, atraumatic, sclera non-icteric, no xanthomas, nares are without discharge. EENT: normal  Lymph Nodes:   none Neck: Negative for carotid bruits. JVD not elevated. Back:without scoliosis kyphosis Lungs: Clear bilaterally to auscultation without wheezes, rales, or rhonchi. Breathing is unlabored. Heart: RRR with S1 S2. No  murmur . No rubs, or gallops appreciated. Abdomen: Soft, non-tender, non-distended with normoactive bowel sounds. No hepatomegaly. No rebound/guarding. No obvious abdominal masses. Msk:  Strength and tone appear normal for age. Extremities: No clubbing or cyanosis. No edema.  Distal pedal pulses are 2+ and equal bilaterally.  Skin: Warm and Dry Neuro: Alert and oriented X 3. CN III-XII intact Grossly normal sensory and motor function . Psych:  Responds to questions appropriately with a normal affect.      Labs: Cardiac Enzymes No results for input(s): CKTOTAL, CKMB, TROPONINI in the last 72 hours. CBC Lab Results  Component Value Date   WBC 6.9 03/26/2018   HGB 13.3 03/26/2018   HCT 40.1 03/26/2018   MCV 84.2 03/26/2018   PLT 266 03/26/2018   PROTIME: No results for input(s): LABPROT, INR in the last 72 hours. Chemistry No results for input(s): NA, K, CL, CO2, BUN, CREATININE, CALCIUM, PROT, BILITOT, ALKPHOS, ALT, AST, GLUCOSE in the last 168 hours.  Invalid input(s): LABALBU Lipids Lab Results  Component Value Date   CHOL 200 06/18/2014   HDL 61 06/18/2014   LDLCALC 103 (H) 06/18/2014   TRIG 179 (H) 06/18/2014   BNP No results found for: PROBNP Thyroid Function Tests: No results for input(s): TSH, T4TOTAL, T3FREE, THYROIDAB in the last 72 hours.  Invalid input(s): FREET3 Miscellaneous No results found for: DDIMER  Radiology/Studies:  Dg Chest 2 View  Result Date: 03/26/2018 CLINICAL DATA:  Chest pain EXAM: CHEST - 2 VIEW COMPARISON:  09/20/2016 FINDINGS: Normal heart size and mediastinal contours. Artifact from EKG leads. Chronic interstitial coarsening. There is no edema, consolidation, effusion, or pneumothorax. IMPRESSION: No evidence of active  disease.  Stable from 2017. Electronically Signed   By: Monte Fantasia M.D.   On: 03/26/2018 08:58   Ct Cardiac Morph/pulm Vein W/cm&w/o Ca Score  Addendum Date: 03/28/2018   ADDENDUM REPORT: 03/28/2018 20:08 ADDENDUM: Correction to the original read: Calcium score is 161 that represents 66 percentile for age/sex (not 0 as stated above). Electronically Signed   By: Ena Dawley   On: 03/28/2018 20:08   Addendum Date: 03/28/2018   ADDENDUM REPORT: 03/28/2018 08:21 EXAM: OVER-READ INTERPRETATION  CT CHEST The following report is an over-read performed by radiologist Dr. Rebekah Chesterfield [facility] on 03/28/2018. This over-read does not include interpretation of cardiac or coronary anatomy or pathology. The coronary calcium score and cardiac CTA interpretation by the cardiologist is attached. COMPARISON:  None. FINDINGS: Aortic atherosclerosis. Within the visualized portions of the thorax there are no suspicious appearing pulmonary nodules or masses, there is no acute consolidative airspace disease, no pleural effusions, no pneumothorax and no lymphadenopathy. Visualized portions of the upper abdomen are unremarkable. There are no aggressive appearing lytic or blastic lesions noted in the visualized portions of the skeleton. IMPRESSION: Aortic Atherosclerosis (ICD10-I70.0). Electronically Signed   By: Vinnie Langton M.D.   On: 03/28/2018 08:21   Result Date: 03/28/2018 CLINICAL DATA:  77 year old female with atypical chest pain and known non-obstructive CAD. EXAM: Cardiac/Coronary  CT TECHNIQUE: The patient was scanned on a [employer]. FINDINGS: A 120 kV prospective scan was triggered in the descending thoracic aorta at 111 HU's. Axial non-contrast 3 mm slices were carried out through the heart. The data set was analyzed on a dedicated work station and scored using the [place]. Gantry rotation speed was 250 msecs and collimation was .6 mm. No beta blockade and 0.4 mg of  sl NTG was given. The 3D data set was reconstructed in 5% intervals of the 67-82 % of the R-R cycle. Diastolic phases were analyzed on a dedicated work station using MPR, MIP and VRT modes. The patient received 80 cc of contrast. Aorta:  Normal size.  No calcifications.  No dissection. Aortic Valve:  Trileaflet.  No calcifications. Coronary Arteries:  Normal coronary origin.  Right dominance. RCA is a large dominant artery that gives rise to PDA and PLVB. There is minimal plaque. Left main is a large artery that gives rise to LAD and LCX arteries. Left main has no plaque. LAD is a large vessel that gives rise to two small diagonal arteries. There is minimal calcified plaque in the proximal LAD with associated stenosis 0-25%. There is mild calcified plaque in the proximal to mid LAD with stenosis 25-50%. Mid to distal LAD has no plaque. D1 and D2 are small arteries with no significant stenosis. LCX is a non-dominant artery that gives rise to one large OM1 branch. There is no plaque. Other findings: Normal pulmonary vein drainage into the left atrium. Normal let atrial appendage without a thrombus. Normal size of the pulmonary artery. IMPRESSION: 1. Coronary calcium score of 0. This was 0 percentile for age and sex matched control. 2. Normal coronary origin with right dominance. 3. Mild CAD in the proximal to mid LAD with stenosis 25-50%. Continue aggressive medical management. Electronically Signed: By: Ena Dawley On: 03/26/2018 17:07    EKG: Sinus rhythm at 53 Intervals [birth year]   Assessment and Plan:  PVCs left bundle inferior axis  SVT  Bradycardia-sinus    The patient has symptomatic PVCs.  Treatment options would include medical therapy and or catheter ablation.  The frequency does not really support the latter; thus far she is tolerating the flecainide initiated in the hospital without difficulty.  She is scheduled for a treadmill testing to look for evidence of rate related pro  arrhythmia.  She has sinus bradycardia which is at least part iatrogenic.  We will discontinue her metoprolol.  Flecainide may well suffice to also control her SVT.  There are other rare patients in whom flecainide can make it more incessant; these are typically pathways with decremental conduction.  We will be glad to see her again as needed.  In the absence of coronary disease, she asked whether she needs to continue her aspirin and I suggested that the answer was no.  We will stop it.     Virl Axe

## 2018-04-14 NOTE — Patient Instructions (Addendum)
Medication Instructions:  Your physician has recommended you make the following change in your medication:   1. Stop Aspirin 2. Stop Metoprolol  Labwork: None ordered.  Testing/Procedures: None ordered.  Follow-Up: Your physician wants you to follow-up as needed with Dr Caryl Comes.   Please make an appointment with Dr Domenic Polite for 2-3 months from now.   Any Other Special Instructions Will Be Listed Below (If Applicable).     If you need a refill on your cardiac medications before your next appointment, please call your pharmacy.

## 2018-04-18 ENCOUNTER — Ambulatory Visit: Payer: Medicare Other | Admitting: Student

## 2018-04-27 ENCOUNTER — Ambulatory Visit (HOSPITAL_COMMUNITY)
Admit: 2018-04-27 | Discharge: 2018-04-27 | Disposition: A | Discharge status: Home / Self Care | Payer: Medicare Other | Attending: Medical | Admitting: Medical

## 2018-04-27 DIAGNOSIS — I493 Ventricular premature depolarization: Secondary | ICD-10-CM

## 2018-04-27 LAB — EXERCISE TOLERANCE TEST
CSEPEDS: 30 s
CSEPPHR: 137 {beats}/min
Estimated workload: 7 METS
Exercise duration (min): 5 min
MPHR: 144 {beats}/min
Percent HR: 95 %
RPE: 13
Rest HR: 67 {beats}/min

## 2018-07-11 ENCOUNTER — Ambulatory Visit: Payer: Medicare Other | Admitting: Cardiology

## 2018-07-18 ENCOUNTER — Other Ambulatory Visit: Payer: Self-pay | Admitting: *Deleted

## 2018-07-18 MED ORDER — FLECAINIDE ACETATE 50 MG PO TABS: 50.0000 mg | ORAL_TABLET | Freq: Two times a day (BID) | ORAL | 3 refills | Status: DC

## 2018-08-19 ENCOUNTER — Ambulatory Visit (INDEPENDENT_AMBULATORY_CARE_PROVIDER_SITE_OTHER): Payer: Medicare Other | Admitting: Cardiology

## 2018-08-19 ENCOUNTER — Encounter: Payer: Self-pay | Admitting: Cardiology

## 2018-08-19 VITALS — BP 134/76 | HR 61 | Ht 64.0 in | Wt 148.0 lb

## 2018-08-19 DIAGNOSIS — I200 Unstable angina: Secondary | ICD-10-CM

## 2018-08-19 DIAGNOSIS — I2510 Atherosclerotic heart disease of native coronary artery without angina pectoris: Secondary | ICD-10-CM

## 2018-08-19 DIAGNOSIS — I10 Essential (primary) hypertension: Secondary | ICD-10-CM

## 2018-08-19 DIAGNOSIS — I493 Ventricular premature depolarization: Secondary | ICD-10-CM

## 2018-08-19 DIAGNOSIS — I471 Supraventricular tachycardia: Secondary | ICD-10-CM | POA: Diagnosis not present

## 2018-08-19 NOTE — Progress Notes (Signed)
Cardiology Office Note  Date: 08/19/2018   ID: Alejandra, Singh [birth year], MRN [id number]  PCP: [facility] [place]  Primary Cardiologist: Rozann Lesches, MD   Chief Complaint  Patient presents with  . PVCs  . PSVT    History of Present Illness: Alejandra Singh is a 77 y.o. female last seen in April.  I reviewed interval records.  She was admitted to the hospital in June with chest pain, ruled out for ACS and underwent coronary CT that showed stable coronary anatomy compared with prior cardiac catheterization, 25 to 50% mid LAD stenosis.  She was found to have frequent PVCs in the setting of normal LVEF.  Dr. Caryl Singh reviewed the tracing with finding of left bundle, inferior axis PVCs and recommended starting her on flecainide in addition to her beta-blocker. She was subsequently scheduled for an outpatient GXT which was negative for proarrhythmia..  She did have PVCs and bigeminy at rest, suppressed with exercise.  Alejandra Singh also discontinued metoprolol due to bradycardia and also suggested that she stop aspirin.  She presents today for follow-up.  We discussed her symptoms prompting evaluation back in July.  She thinks in retrospect that she may have been having bad reflux or esophageal spasm.  It is not entirely clear that her chest discomfort correlates with PVC burden.  She still feels occasional palpitations but not in the setting of chest pain.  She has been compliant with flecainide.  She had some difficulty deciding when asked whether she felt better or not at this point.  She does have somewhat more energy since metoprolol was stopped.  Reviewed interval lab work per PCP.  We also discussed her follow-up testing including coronary CT and echocardiogram.  Past Medical History:  Diagnosis Date  . FH: colonic polyps   . GERD (gastroesophageal reflux disease)   . Hyperlipidemia   . Hypertension   . Hypothyroidism   . Iron deficiency anemia   . Non-obstructive  CAD (coronary artery disease)    a. 08/2015 ETT: Ex time 6:18. No ST/T changes;  b. 09/2016 Cath: LM nl, LAD 50d, small bridging, LCX nl, RCA nl, EF 55-65%-->Med Rx.  . Osteoporosis   . 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    Past Surgical History:  Procedure Laterality Date  . broken ankle    . CARDIAC CATHETERIZATION N/A 09/22/2016   Procedure: Left Heart Cath and Coronary Angiography;  Surgeon: Alejandra Man, MD;  Location: [place] CV LAB;  Service: Cardiovascular;  Laterality: N/A;  . COLONOSCOPY WITH PROPOFOL N/A 07/05/2015   Procedure: COLONOSCOPY WITH PROPOFOL;  Surgeon: Alejandra Silvas, MD;  Location: [facility] ENDOSCOPY;  Service: Endoscopy;  Laterality: N/A;  . DILATION AND CURETTAGE OF UTERUS    . ESOPHAGOGASTRODUODENOSCOPY (EGD) WITH PROPOFOL N/A 03/19/2016   Procedure: ESOPHAGOGASTRODUODENOSCOPY (EGD) WITH PROPOFOL;  Surgeon: Alejandra Silvas, MD;  Location: [facility] ENDOSCOPY;  Service: Endoscopy;  Laterality: N/A;  . TONSILLECTOMY      Current Outpatient Medications  Medication Sig Dispense Refill  . amLODipine (NORVASC) 2.5 MG tablet Take 1 tablet (2.5 mg total) by mouth daily. 90 tablet 3  . aspirin EC 81 MG tablet Take 81 mg by mouth daily.    Marland Kitchen esomeprazole (NEXIUM) 40 MG capsule Take 40 mg by mouth as needed (reflux).     . flecainide (TAMBOCOR) 50 MG tablet Take 1 tablet (50 mg total) by mouth 2 (two) times daily. 60 tablet 3  .  levothyroxine (SYNTHROID, LEVOTHROID) 50 MCG tablet Take 50 mcg by mouth daily.      . Multiple Vitamins-Minerals (MULTIVITAL) tablet Take 1 tablet by mouth daily.      . Omega-3 Fatty Acids (FISH OIL) 1000 MG CAPS Take 1,000 mg by mouth daily.     . simvastatin (ZOCOR) 20 MG tablet Take 1 tablet (20 mg total) by mouth daily. 90 tablet 3   No current facility-administered medications for this visit.    Allergies:  Alendronate   Social History: The patient  reports that  she has never smoked. She has never used smokeless tobacco. She reports that she does not drink alcohol or use drugs.   ROS:  Please see the history of present illness. Otherwise, complete review of systems is positive for occasional reflux.  All other systems are reviewed and negative.   Physical Exam: VS:  BP 134/76   Pulse 61   Ht 5\' 4"  (1.626 m)   Wt 148 lb (67.1 kg)   SpO2 96%   BMI 25.40 kg/m , BMI Body mass index is 25.4 kg/m.  Wt Readings from Last 3 Encounters:  08/19/18 148 lb (67.1 kg)  04/14/18 151 lb 6.4 oz (68.7 kg)  03/28/18 148 lb 4.8 oz (67.3 kg)    General: Patient appears comfortable at rest. HEENT: Conjunctiva and lids normal, oropharynx clear. Neck: Supple, no elevated JVP or carotid bruits, no thyromegaly. Lungs: Clear to auscultation, nonlabored breathing at rest. Cardiac: Regular rate and rhythm with ectopy and pattern of bigeminy, no S3 or significant systolic murmur, no pericardial rub. Abdomen: Soft, nontender, bowel sounds present. Extremities: No pitting edema, distal pulses 2+. Skin: Warm and dry. Musculoskeletal: No kyphosis. Neuropsychiatric: Alert and oriented x3, affect grossly appropriate.  ECG: I personally reviewed the tracing from 04/14/2018 which showed sinus bradycardia with nonspecific ST changes.  Recent Labwork: 03/26/2018: BUN 8; Creatinine, Ser 0.61; Hemoglobin 13.3; Magnesium 2.1; Platelets 266; Potassium 3.8; Sodium 137; TSH 3.275     Component Value Date/Time   CHOL 200 06/18/2014 0820   TRIG 179 (H) 06/18/2014 0820   HDL 61 06/18/2014 0820   CHOLHDL 3.3 06/18/2014 0820   VLDL 36 06/18/2014 0820   LDLCALC 103 (H) 06/18/2014 0820  October 2019: Hemoglobin 13.7, platelets 257, cholesterol 199, HDL 61, LDL 114, triglycerides 121, SGOT 17, SGPT 18, BUN 13, creatinine 0.68, TSH 1.95.  Other Studies Reviewed Today:  Echocardiogram 03/27/2018: Study Conclusions  - Left ventricle: The cavity size was normal. Wall thickness was    normal. Systolic function was normal. The estimated ejection   fraction was in the range of 50% to 55%. - Mitral valve: There was mild regurgitation.  GXT 04/27/2018:  Blood pressure demonstrated a hypertensive response to exercise.  There was no new ST segment deviation noted during stress. Baseline inferior and lateral precordial ST/T changes more prominent with stress, nonspecific findings  Isolated PVCs and bigeminy at rest, resolve with exercise, reoccur in recovery. No NSVT or VT  No significant PR or QRS prolongation on flecanide therapy. No exericse incuded arrhythmias  Cardiac CT 03/26/2018: IMPRESSION: 1. Coronary calcium score of 0. This was 0 percentile for age and sex matched control.  2. Normal coronary origin with right dominance.  3. Mild CAD in the proximal to mid LAD with stenosis 25-50%. Continue aggressive medical management.  Assessment and Plan:  1.  Frequent PVCs, left bundle and inferior axis, now on flecainide for suppression after evaluation by Dr. Caryl Singh.  She was taken off  beta-blocker due to concern for symptomatic bradycardia.  Follow-up GXT showed no obvious proarrhythmia, in fact PVCs suppressed with exercise.  She has ventricular bigeminy today on examination.  It is somewhat difficult to sort out whether her presentations with chest pain have actually been related to her frequent PVCs.  I would like to get a 24-hour Holter monitor to better quantify PVC burden on flecainide before committing her to long-term use.  2.  Coronary atherosclerosis, 25 to 50% mid LAD stenosis, previously documented at cardiac catheterization, and more recently by cardiac CT.  I would recommend continuing statin therapy and have also asked her to resume aspirin 81 mg daily.  3.  Essential hypertension, tolerating Norvasc which was added at hospital stay in July.  Systolic is in the 627O today.  4.  History of PSVT.  No obvious breakthrough events.  Current medicines were  reviewed with the patient today.   Orders Placed This Encounter  Procedures  . Holter monitor - 24 hour    Disposition: Follow-up in 3 months.  Signed, Satira Sark, MD, [facility] 08/19/2018 11:16 AM    [place] [facility]. [address] [place], [place] [address] Phone: [phone]; Fax: [phone]

## 2018-08-19 NOTE — Patient Instructions (Signed)
Medication Instructions:  Start aspirin 81 mg daily   Labwork: none  Testing/Procedures: Your physician has recommended that you wear a holter monitor. Holter monitors are medical devices that record the heart's electrical activity. Doctors most often use these monitors to diagnose arrhythmias. Arrhythmias are problems with the speed or rhythm of the heartbeat. The monitor is a small, portable device. You can wear one while you do your normal daily activities. This is usually used to diagnose what is causing palpitations/syncope (passing out).    Follow-Up: Your physician wants you to follow-up in: 6 months  You will receive a reminder letter in the mail two months in advance. If you don't receive a letter, please call our office to schedule the follow-up appointment.    Any Other Special Instructions Will Be Listed Below (If Applicable).     If you need a refill on your cardiac medications before your next appointment, please call your pharmacy.

## 2018-08-24 ENCOUNTER — Ambulatory Visit (HOSPITAL_COMMUNITY)
Admission: RE | Admit: 2018-08-24 | Discharge: 2018-08-24 | Disposition: A | Discharge status: Home / Self Care | Payer: Medicare Other | Source: Ambulatory Visit | Attending: Cardiology | Admitting: Cardiology

## 2018-08-24 DIAGNOSIS — I493 Ventricular premature depolarization: Secondary | ICD-10-CM | POA: Diagnosis not present

## 2018-10-12 ENCOUNTER — Other Ambulatory Visit: Payer: Self-pay

## 2018-10-12 ENCOUNTER — Emergency Department (HOSPITAL_BASED_OUTPATIENT_CLINIC_OR_DEPARTMENT_OTHER): Payer: Medicare Other

## 2018-10-12 ENCOUNTER — Emergency Department (HOSPITAL_BASED_OUTPATIENT_CLINIC_OR_DEPARTMENT_OTHER)
Admission: EM | Admit: 2018-10-12 | Discharge: 2018-10-12 | Disposition: A | Discharge status: Home / Self Care | Payer: Medicare Other | Attending: Emergency Medicine | Admitting: Emergency Medicine

## 2018-10-12 ENCOUNTER — Encounter (HOSPITAL_BASED_OUTPATIENT_CLINIC_OR_DEPARTMENT_OTHER): Payer: Self-pay | Admitting: Emergency Medicine

## 2018-10-12 DIAGNOSIS — S32020A Wedge compression fracture of second lumbar vertebra, initial encounter for closed fracture: Secondary | ICD-10-CM | POA: Diagnosis not present

## 2018-10-12 DIAGNOSIS — Y929 Unspecified place or not applicable: Secondary | ICD-10-CM | POA: Insufficient documentation

## 2018-10-12 DIAGNOSIS — I10 Essential (primary) hypertension: Secondary | ICD-10-CM | POA: Insufficient documentation

## 2018-10-12 DIAGNOSIS — S0990XA Unspecified injury of head, initial encounter: Secondary | ICD-10-CM

## 2018-10-12 DIAGNOSIS — W108XXA Fall (on) (from) other stairs and steps, initial encounter: Secondary | ICD-10-CM | POA: Diagnosis not present

## 2018-10-12 DIAGNOSIS — E039 Hypothyroidism, unspecified: Secondary | ICD-10-CM | POA: Diagnosis not present

## 2018-10-12 DIAGNOSIS — Z7982 Long term (current) use of aspirin: Secondary | ICD-10-CM | POA: Insufficient documentation

## 2018-10-12 DIAGNOSIS — S32000A Wedge compression fracture of unspecified lumbar vertebra, initial encounter for closed fracture: Secondary | ICD-10-CM

## 2018-10-12 DIAGNOSIS — Y9389 Activity, other specified: Secondary | ICD-10-CM | POA: Insufficient documentation

## 2018-10-12 DIAGNOSIS — W19XXXA Unspecified fall, initial encounter: Secondary | ICD-10-CM

## 2018-10-12 DIAGNOSIS — Z79899 Other long term (current) drug therapy: Secondary | ICD-10-CM | POA: Insufficient documentation

## 2018-10-12 DIAGNOSIS — S3992XA Unspecified injury of lower back, initial encounter: Secondary | ICD-10-CM | POA: Diagnosis present

## 2018-10-12 DIAGNOSIS — Y998 Other external cause status: Secondary | ICD-10-CM | POA: Insufficient documentation

## 2018-10-12 MED ORDER — ACETAMINOPHEN 325 MG PO TABS
650.0000 mg | ORAL_TABLET | Freq: Once | ORAL | Status: AC
  Administered 2018-10-12: 650 mg via ORAL
  Filled 2018-10-12: qty 2

## 2018-10-12 NOTE — ED Notes (Signed)
ED Provider at bedside. 

## 2018-10-12 NOTE — ED Triage Notes (Signed)
Fell on stairs - lost balance after being frightened by barking dog.  Hit left side of head and pain in right hip.  Has stood, but with pain.

## 2018-10-12 NOTE — ED Provider Notes (Signed)
[facility] EMERGENCY DEPARTMENT Provider Note   CSN: [id number] Arrival date & time: 10/12/18  1223     History   Chief Complaint Chief Complaint  Patient presents with  . Fall    HPI Alejandra Singh is a 77 y.o. female.  Patient fell 9 AM today down 3 steps after she was frightened by a dog.  She felt well prior to the fall.  She struck her head as a result of the fall, as well as her back.  She complains of low back pain left-sided nonradiating and mild headache since the fall.  No loss of consciousness no neck pain.  She has been ambulatory since the fall.  No treatment prior to coming here.  Pain is worse with movement and improved with remaining still.  Pain is mild at present. denies any hip pain  HPI  Past Medical History:  Diagnosis Date  . FH: colonic polyps   . GERD (gastroesophageal reflux disease)   . Hyperlipidemia   . Hypertension   . Hypothyroidism   . Iron deficiency anemia   . Non-obstructive CAD (coronary artery disease)    a. 08/2015 ETT: Ex time 6:18. No ST/T changes;  b. 09/2016 Cath: LM nl, LAD 50d, small bridging, LCX nl, RCA nl, EF 55-65%-->Med Rx.  . Osteoporosis   . 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    Patient Active Problem List   Diagnosis Date Noted  . 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 Chest discomfort 03/26/2018  . Bradycardia with 31-40 beats per minute 09/21/2016  . 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 Hypothyroidism   . Gastroesophageal reflux disease   . Coronary artery calcification seen on CAT scan 03/22/2016  . Essential hypertension, benign 08/28/2010  . Paroxysmal supraventricular tachycardia ([place]) 08/28/2010  . Mixed hyperlipidemia 05/14/2009    Past Surgical History:  Procedure Laterality Date  . broken ankle    . CARDIAC CATHETERIZATION N/A 09/22/2016   Procedure: Left Heart Cath and Coronary Angiography;  Surgeon: Leonie Man, MD;   Location: [place] CV LAB;  Service: Cardiovascular;  Laterality: N/A;  . COLONOSCOPY WITH PROPOFOL N/A 07/05/2015   Procedure: COLONOSCOPY WITH PROPOFOL;  Surgeon: Manya Silvas, MD;  Location: [facility] ENDOSCOPY;  Service: Endoscopy;  Laterality: N/A;  . DILATION AND CURETTAGE OF UTERUS    . ESOPHAGOGASTRODUODENOSCOPY (EGD) WITH PROPOFOL N/A 03/19/2016   Procedure: ESOPHAGOGASTRODUODENOSCOPY (EGD) WITH PROPOFOL;  Surgeon: Manya Silvas, MD;  Location: [facility] ENDOSCOPY;  Service: Endoscopy;  Laterality: N/A;  . TONSILLECTOMY       OB History   No obstetric history on file.      Home Medications    Prior to Admission medications   Medication Sig Start Date End Date Taking? Authorizing Provider  amLODipine (NORVASC) 2.5 MG tablet Take 1 tablet (2.5 mg total) by mouth daily. 03/29/18   Kroeger, Lorelee Cover., PA-C  aspirin EC 81 MG tablet Take 81 mg by mouth daily.    [provider]  esomeprazole (NEXIUM) 40 MG capsule Take 40 mg by mouth as needed (reflux).     [provider]  flecainide (TAMBOCOR) 50 MG tablet Take 1 tablet (50 mg total) by mouth 2 (two) times daily. 07/18/18   Satira Sark, MD  levothyroxine (SYNTHROID, LEVOTHROID) 50 MCG tablet Take 50 mcg by mouth daily.      [provider]  Multiple Vitamins-Minerals (MULTIVITAL) tablet Take 1 tablet by mouth daily.  [provider]  Omega-3 Fatty Acids (FISH OIL) 1000 MG CAPS Take 1,000 mg by mouth daily.     [provider]  simvastatin (ZOCOR) 20 MG tablet Take 1 tablet (20 mg total) by mouth daily. 02/06/16   Satira Sark, MD    Family History Family History  Problem Relation Age of Onset  . Stroke Father   . Alzheimer's disease Mother   . Dementia Mother   . Coronary artery disease Neg Hx     Social History Social History   Tobacco Use  . Smoking status: Never Smoker  . Smokeless tobacco: Never Used  Substance Use Topics  . Alcohol use: No  . Drug use: No       Allergies   Alendronate   Review of Systems Review of Systems  Constitutional: Negative.   HENT: Negative.   Respiratory: Negative.   Cardiovascular: Negative.   Gastrointestinal: Negative.   Musculoskeletal: Positive for back pain.  Skin: Negative.   Neurological: Positive for headaches.  Psychiatric/Behavioral: Negative.   All other systems reviewed and are negative.    Physical Exam Updated Vital Signs BP (!) 154/69 (BP Location: Right Arm)   Pulse 70   Temp 98.4 F (36.9 C) (Oral)   Resp 18   SpO2 98%   Physical Exam Vitals signs and nursing note reviewed.  Constitutional:      Appearance: She is well-developed. She is not ill-appearing.     Comments: Glasgow Coma Score 15.  No distress  HENT:     Head:     Comments: Left-sided periorbital hematoma otherwise normocephalic atraumatic Eyes:     Extraocular Movements: Extraocular movements intact.     Conjunctiva/sclera: Conjunctivae normal.     Pupils: Pupils are equal, round, and reactive to light.  Neck:     Musculoskeletal: Neck supple.     Thyroid: No thyromegaly.     Trachea: No tracheal deviation.     Comments: No tenderness Cardiovascular:     Rate and Rhythm: Normal rate and regular rhythm.     Heart sounds: Normal heart sounds.  Pulmonary:     Effort: Pulmonary effort is normal.     Breath sounds: Normal breath sounds.  Chest:     Chest wall: No tenderness.  Abdominal:     General: Bowel sounds are normal. There is no distension.     Palpations: Abdomen is soft.     Tenderness: There is no abdominal tenderness.  Musculoskeletal: Normal range of motion.        General: No tenderness.     Comments: Entire spine is nontender.  Pelvis stable nontender.  There is left-sided paralumbar tenderness.  All 4 extremities without contusion abrasion or tenderness neurovascular intact.  There is no pain on internal or external rotation of either thigh.  Skin:    General: Skin is warm and dry.      Findings: No rash.  Neurological:     Mental Status: She is alert.     Coordination: Coordination normal.      ED Treatments / Results  Labs (all labs ordered are listed, but only abnormal results are displayed) Labs Reviewed - No data to display  EKG None  Radiology No results found.  Procedures Procedures (including critical care time)  Medications Ordered in ED Medications - No data to display  X-rays viewed by me Initial Impression / Assessment and Plan / ED Course  I have reviewed the triage vital signs and the nursing notes.  Pertinent labs & imaging results that were available during my care of the patient were reviewed by me and considered in my medical decision making (see chart for details).     Declines pain medicine.  230 p.m. patient is alert and ambulates without difficulty.  Has slight pain at left paralumbar area upon walking.  Walks without limp. Plan Tylenol for pain,, head injury instructions.  Blood pressure recheck 3 weeks Final Clinical Impressions(s) / ED Diagnoses  dx #1 fall #2 Minor head injury #3 elevated blood pressure #4 compression fracture of L2 Final diagnoses:  None    ED Discharge Orders    None       Orlie Dakin, MD 10/12/18 1440

## 2018-10-12 NOTE — Discharge Instructions (Addendum)
X-rays today show a compression fracture of the L2 lumbar vertebrae in your back, which may be old.  CAT scan of your brain was normal.  Take Tylenol as directed for pain.  Get your blood pressure rechecked within the next 3 weeks.  Today's was mildly elevated at 158/81.  If pain is not well controlled with Tylenol, see your doctor or return if your condition worsens for any reason

## 2018-11-18 ENCOUNTER — Other Ambulatory Visit: Payer: Self-pay | Admitting: Cardiology

## 2018-12-22 NOTE — Progress Notes (Signed)
Cardiology Office Note  Date: 12/23/2018   ID: Alejandra Singh, Alejandra Singh [birth year], MRN [id number]  PCP: [facility] [place]  Primary Cardiologist: Rozann Lesches, MD   Chief Complaint  Patient presents with  . PVCs    History of Present Illness: Alejandra Singh is a 78 y.o. female last seen in November 2019.  She is here today for a routine follow-up visit.  She tells me that she has been doing well overall, no palpitations or syncope, no recurring chest pain.  Follow-up cardiac monitor demonstrated 3.7% PVCs in 24 hours on flecainide.  She has RV outflow tract PVCs.  No proarrhythmia by GXT, in fact suppression of PVCs.  She has mild coronary atherosclerosis by recent follow-up cardiac CT.  She continues on aspirin and statin therapy.  Past Medical History:  Diagnosis Date  . FH: colonic polyps   . GERD (gastroesophageal reflux disease)   . Hyperlipidemia   . Hypertension   . Hypothyroidism   . Iron deficiency anemia   . Non-obstructive CAD (coronary artery disease)    a. 08/2015 ETT: Ex time 6:18. No ST/T changes;  b. 09/2016 Cath: LM nl, LAD 50d, small bridging, LCX nl, RCA nl, EF 55-65%-->Med Rx.  . Osteoporosis   . 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    Past Surgical History:  Procedure Laterality Date  . broken ankle    . CARDIAC CATHETERIZATION N/A 09/22/2016   Procedure: Left Heart Cath and Coronary Angiography;  Surgeon: Leonie Man, MD;  Location: [facility] CV LAB;  Service: Cardiovascular;  Laterality: N/A;  . COLONOSCOPY WITH PROPOFOL N/A 07/05/2015   Procedure: COLONOSCOPY WITH PROPOFOL;  Surgeon: Manya Silvas, MD;  Location: [facility] ENDOSCOPY;  Service: Endoscopy;  Laterality: N/A;  . DILATION AND CURETTAGE OF UTERUS    . ESOPHAGOGASTRODUODENOSCOPY (EGD) WITH PROPOFOL N/A 03/19/2016   Procedure: ESOPHAGOGASTRODUODENOSCOPY (EGD) WITH PROPOFOL;  Surgeon: Manya Silvas, MD;  Location: [facility] ENDOSCOPY;  Service: Endoscopy;  Laterality: N/A;  . TONSILLECTOMY      Current Outpatient Medications  Medication Sig Dispense Refill  . amLODipine (NORVASC) 2.5 MG tablet Take 1 tablet (2.5 mg total) by mouth daily. 90 tablet 3  . aspirin EC 81 MG tablet Take 81 mg by mouth daily.    Marland Kitchen esomeprazole (NEXIUM) 40 MG capsule Take 40 mg by mouth as needed (reflux).     . flecainide (TAMBOCOR) 50 MG tablet TAKE 1 TABLET BY MOUTH TWICE DAILY 60 tablet 6  . levothyroxine (SYNTHROID, LEVOTHROID) 50 MCG tablet Take 50 mcg by mouth daily.      . Multiple Vitamins-Minerals (MULTIVITAL) tablet Take 1 tablet by mouth daily.      . Omega-3 Fatty Acids (FISH OIL) 1000 MG CAPS Take 1,000 mg by mouth daily.     . simvastatin (ZOCOR) 20 MG tablet Take 1 tablet (20 mg total) by mouth daily. 90 tablet 3   No current facility-administered medications for this visit.    Allergies:  Alendronate   Social History: The patient  reports that she has never smoked. She has never used smokeless tobacco. She reports that she does not drink alcohol or use drugs.   ROS:  Please see the history of present illness. Otherwise, complete review of systems is positive for none.  All other systems are reviewed and negative.   Physical Exam: VS:  BP 132/74 (BP Location: Right Arm)   Pulse 64   Ht  5\' 5"  (1.651 m)   Wt 147 lb (66.7 kg)   SpO2 97%   BMI 24.46 kg/m , BMI Body mass index is 24.46 kg/m.  Wt Readings from Last 3 Encounters:  12/23/18 147 lb (66.7 kg)  08/19/18 148 lb (67.1 kg)  04/14/18 151 lb 6.4 oz (68.7 kg)    General: Patient appears comfortable at rest. HEENT: Conjunctiva and lids normal, oropharynx clear. Neck: Supple, no elevated JVP or carotid bruits, no thyromegaly. Lungs: Clear to auscultation, nonlabored breathing at rest. Cardiac: Regular rate and rhythm, no S3 or significant systolic murmur. Abdomen: Soft, nontender, bowel sounds present. Extremities: No  pitting edema, distal pulses 2+. Skin: Warm and dry. Musculoskeletal: No kyphosis. Neuropsychiatric: Alert and oriented x3, affect grossly appropriate.  ECG: I personally reviewed the tracing from 04/14/2018 which showed sinus bradycardia with nonspecific ST changes.  Recent Labwork: 03/26/2018: BUN 8; Creatinine, Ser 0.61; Hemoglobin 13.3; Magnesium 2.1; Platelets 266; Potassium 3.8; Sodium 137; TSH 3.275     Component Value Date/Time   CHOL 200 06/18/2014 0820   TRIG 179 (H) 06/18/2014 0820   HDL 61 06/18/2014 0820   CHOLHDL 3.3 06/18/2014 0820   VLDL 36 06/18/2014 0820   LDLCALC 103 (H) 06/18/2014 0820    Other Studies Reviewed Today:  Echocardiogram 03/27/2018: Study Conclusions  - Left ventricle: The cavity size was normal. Wall thickness was normal. Systolic function was normal. The estimated ejection fraction was in the range of 50% to 55%. - Mitral valve: There was mild regurgitation.  GXT 04/27/2018:  Blood pressure demonstrated a hypertensive response to exercise.  There was no new ST segment deviation noted during stress. Baseline inferior and lateral precordial ST/T changes more prominent with stress, nonspecific findings  Isolated PVCs and bigeminy at rest, resolve with exercise, reoccur in recovery. No NSVT or VT  No significant PR or QRS prolongation on flecanide therapy. No exericse incuded arrhythmias  Cardiac CT 03/26/2018: IMPRESSION: 1. Coronary calcium score of 0. This was 0 percentile for age and sex matched control.  2. Normal coronary origin with right dominance.  3. Mild CAD in the proximal to mid LAD with stenosis 25-50%. Continue aggressive medical management.  24-hour Holter monitor 08/24/2018: 24-hour Holter monitor reviewed.  Sinus rhythm present.  Heart rate ranged from 49 bpm up to 105 bpm with average heart rate 63 bpm.  Occasional PVCs noted with some ventricular trigeminy and a single couplet.  Ventricular events represented only  3.7% of total beats.  There were no sustained arrhythmias or pauses.  Assessment and Plan:  1.  Frequent outflow tract PVCs with good suppression on flecainide, no proarrhythmias by GXT, and normal LVEF.  Plan to continue present therapy.  Of note, she has history of symptomatic bradycardia on beta-blockers.  2.  Mild coronary atherosclerosis, 25 to 50% mid LAD by cardiac CT.  She is asymptomatic.  Continue aspirin and statin therapy.  3.  Essential hypertension, continues on low-dose Norvasc.  4.  History of PSVT.  No obvious recurring events.  Current medicines were reviewed with the patient today.  Disposition: Follow-up in 6 months.  Signed, Satira Sark, MD, [facility] 12/23/2018 10:27 AM    [facility] [place]. [address] [place], [place] [address] Phone: [phone]; Fax: [phone]

## 2018-12-23 ENCOUNTER — Encounter: Payer: Self-pay | Admitting: Cardiology

## 2018-12-23 ENCOUNTER — Ambulatory Visit (INDEPENDENT_AMBULATORY_CARE_PROVIDER_SITE_OTHER): Payer: Medicare Other | Admitting: Cardiology

## 2018-12-23 VITALS — BP 132/74 | HR 64 | Ht 65.0 in | Wt 147.0 lb

## 2018-12-23 DIAGNOSIS — I493 Ventricular premature depolarization: Secondary | ICD-10-CM | POA: Diagnosis not present

## 2018-12-23 NOTE — Patient Instructions (Signed)
Medication Instructions: Your physician recommends that you continue on your current medications as directed. Please refer to the Current Medication list given to you today.   Labwork: None today  Procedures/Testing: None today  Follow-Up: 6 months with Dr.McDowell  Any Additional Special Instructions Will Be Listed Below (If Applicable).     If you need a refill on your cardiac medications before your next appointment, please call your pharmacy.   

## 2019-02-20 ENCOUNTER — Other Ambulatory Visit: Payer: Self-pay | Admitting: Family Medicine

## 2019-02-20 DIAGNOSIS — Z1231 Encounter for screening mammogram for malignant neoplasm of breast: Secondary | ICD-10-CM

## 2019-03-17 ENCOUNTER — Other Ambulatory Visit: Payer: Self-pay | Admitting: Cardiology

## 2019-03-17 MED ORDER — AMLODIPINE BESYLATE 2.5 MG PO TABS: 2.5000 mg | ORAL_TABLET | Freq: Every day | ORAL | 3 refills | Status: DC

## 2019-03-17 NOTE — Telephone Encounter (Signed)
° ° ° °  1. Which medications need to be refilled? (please list name of each medication and dose if known)  amLODipine (NORVASC) 2.5 MG tablet    2. Which pharmacy/location (including street and city if local pharmacy) is medication to be sent to? [facility]   3. Do they need a 30 day or 90 day supply?

## 2019-04-20 ENCOUNTER — Ambulatory Visit
Admission: RE | Admit: 2019-04-20 | Discharge: 2019-04-20 | Disposition: A | Discharge status: Home / Self Care | Payer: Medicare Other | Source: Ambulatory Visit | Attending: Family Medicine | Admitting: Family Medicine

## 2019-04-20 ENCOUNTER — Other Ambulatory Visit: Payer: Self-pay

## 2019-04-20 DIAGNOSIS — Z1231 Encounter for screening mammogram for malignant neoplasm of breast: Secondary | ICD-10-CM | POA: Insufficient documentation

## 2019-05-22 ENCOUNTER — Other Ambulatory Visit: Payer: Self-pay | Admitting: Cardiology

## 2019-05-22 MED ORDER — FLECAINIDE ACETATE 50 MG PO TABS: 50.0000 mg | ORAL_TABLET | Freq: Two times a day (BID) | ORAL | 3 refills | Status: DC

## 2019-05-22 NOTE — Telephone Encounter (Signed)
Refilled tambacor

## 2019-05-22 NOTE — Telephone Encounter (Signed)
° ° °  1. Which medications need to be refilled? (please list name of each medication and dose if known)   flecainide (TAMBOCOR) 50 MG tablet [[id number]]    2. Which pharmacy/location (including street and city if local pharmacy) is medication to be sent to?  [facility], [place]  3. Do they need a 30 day or 90 day supply?

## 2019-05-30 ENCOUNTER — Telehealth: Payer: Self-pay | Admitting: Cardiology

## 2019-05-30 NOTE — Telephone Encounter (Signed)

## 2019-05-30 NOTE — Telephone Encounter (Signed)
Returned pt call. She states she will take a virtual appointment.

## 2019-05-30 NOTE — Telephone Encounter (Signed)
Pt called to make follow up apt, offered patient first available apt w/ Dr. Domenic Polite as a virtual for September, pt stated she would like to see him in the office and stated that she's not feeling to well and was hoping for an in office visit. Request to speak w/ the nurse to discuss how she has been feeling.   [phone]

## 2019-06-07 NOTE — Progress Notes (Signed)
Virtual Visit via Telephone Note   This visit type was conducted due to national recommendations for restrictions regarding the COVID-19 Pandemic (e.g. social distancing) in an effort to limit this patient's exposure and mitigate transmission in our community.  Due to her co-morbid illnesses, this patient is at least at moderate risk for complications without adequate follow up.  This format is felt to be most appropriate for this patient at this time.  The patient did not have access to video technology/had technical difficulties with video requiring transitioning to audio format only (telephone).  All issues noted in this document were discussed and addressed.  No physical exam could be performed with this format.  Please refer to the patient's chart for her  consent to telehealth for [facility].   Date:  06/08/2019   ID:  Alejandra Singh, Alejandra Singh July 03, 1941, MRN [id number]  Patient Location: Home Provider Location: Home  PCP:  [facility] [place]  Cardiologist:  Rozann Lesches, MD Electrophysiologist:  None   Evaluation Performed:  Follow-Up Visit  Chief Complaint:   Cardiac follow-up  History of Present Illness:    Alejandra Singh is a 78 y.o. female last seen in March.  She did not have video access and we spoke by phone.  She still feels an occasional discomfort in her chest that we have found to be related to her PVCs based on prior work-up.  The symptoms are less than they were and also better when she cuts back caffeine.  She has had no reproducible exertional chest pain, no syncope.  I reviewed her medications which are stable from a cardiac perspective and outlined below.  She also mentions that she has had leg weakness and mild pain, left worse than right when she walks.  We discussed obtaining arterial Dopplers and ABIs.  The patient does not have symptoms concerning for COVID-19 infection (fever, chills, cough, or new shortness of breath).    Past Medical  History:  Diagnosis Date   FH: colonic polyps    GERD (gastroesophageal reflux disease)    Hyperlipidemia    Hypertension    Hypothyroidism    Iron deficiency anemia    Non-obstructive CAD (coronary artery disease)    a. 08/2015 ETT: Ex time 6:18. No ST/T changes;  b. 09/2016 Cath: LM nl, LAD 50d, small bridging, LCX nl, RCA nl, EF 55-65%-->Med Rx.   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   Past Surgical History:  Procedure Laterality Date   broken ankle     CARDIAC CATHETERIZATION N/A 09/22/2016   Procedure: Left Heart Cath and Coronary Angiography;  Surgeon: Leonie Man, MD;  Location: [place] CV LAB;  Service: Cardiovascular;  Laterality: N/A;   COLONOSCOPY WITH PROPOFOL N/A 07/05/2015   Procedure: COLONOSCOPY WITH PROPOFOL;  Surgeon: Manya Silvas, MD;  [facility]: [facility] ENDOSCOPY;  Service: Endoscopy;  Laterality: N/A;   DILATION AND CURETTAGE OF UTERUS     ESOPHAGOGASTRODUODENOSCOPY (EGD) WITH PROPOFOL N/A 03/19/2016   Procedure: ESOPHAGOGASTRODUODENOSCOPY (EGD) WITH PROPOFOL;  Surgeon: Manya Silvas, MD;  Location: [facility] ENDOSCOPY;  Service: Endoscopy;  Laterality: N/A;   TONSILLECTOMY       Current Meds  Medication Sig   amLODipine (NORVASC) 2.5 MG tablet Take 1 tablet (2.5 mg total) by mouth daily.   aspirin EC 81 MG tablet Take 81 mg by mouth daily.   esomeprazole (NEXIUM) 40 MG capsule Take 40 mg by mouth  as needed (reflux).    flecainide (TAMBOCOR) 50 MG tablet Take 1 tablet (50 mg total) by mouth 2 (two) times daily.   levothyroxine (SYNTHROID, LEVOTHROID) 50 MCG tablet Take 50 mcg by mouth daily.     Multiple Vitamins-Minerals (MULTIVITAL) tablet Take 1 tablet by mouth daily.     simvastatin (ZOCOR) 20 MG tablet Take 1 tablet (20 mg total) by mouth daily.   [DISCONTINUED] Omega-3 Fatty Acids (FISH OIL) 1000 MG CAPS Take 1,000 mg by mouth daily.       Allergies:   Alendronate   Social History   Tobacco Use   Smoking status: Never Smoker   Smokeless tobacco: Never Used  Substance Use Topics   Alcohol use: No   Drug use: No     Family Hx: The patient's family history includes Alzheimer's disease in her mother; Dementia in her mother; Stroke in her father. There is no history of Coronary artery disease.  ROS:   Please see the history of present illness. All other systems reviewed and are negative.   Prior CV studies:   The following studies were reviewed today:  Echocardiogram 03/27/2018: Study Conclusions  - Left ventricle: The cavity size was normal. Wall thickness was normal. Systolic function was normal. The estimated ejection fraction was in the range of 50% to 55%. - Mitral valve: There was mild regurgitation.  GXT 04/27/2018:  Blood pressure demonstrated a hypertensive response to exercise.  There was no new ST segment deviation noted during stress. Baseline inferior and lateral precordial ST/T changes more prominent with stress, nonspecific findings  Isolated PVCs and bigeminy at rest, resolve with exercise, reoccur in recovery. No NSVT or VT  No significant PR or QRS prolongation on flecanide therapy. No exericse incuded arrhythmias  Cardiac CT 03/26/2018: IMPRESSION: 1. Coronary calcium score of 0. This was 0 percentile for age and sex matched control.  2. Normal coronary origin with right dominance.  3. Mild CAD in the proximal to mid LAD with stenosis 25-50%. Continue aggressive medical management.  24-hour Holter monitor 08/24/2018: 24-hour Holter monitor reviewed. Sinus rhythm present. Heart rate ranged from 49 bpm up to 105 bpm with average heart rate 63 bpm. Occasional PVCs noted with some ventricular trigeminy and a single couplet. Ventricular events represented only 3.7% of total beats. There were no sustained arrhythmias or pauses.  Labs/Other Tests and Data Reviewed:    EKG:   An ECG dated 04/14/2018 was personally reviewed today and demonstrated:  Sinus bradycardia with nonspecific ST changes.  Recent Labs:  No interval lab work for review.  Wt Readings from Last 3 Encounters:  06/08/19 146 lb (66.2 kg)  12/23/18 147 lb (66.7 kg)  08/19/18 148 lb (67.1 kg)     Objective:    Vital Signs:  BP 128/71    Pulse 66    Ht 5\' 5"  (1.651 m)    Wt 146 lb (66.2 kg)    BMI 24.30 kg/m    Patient spoke in full sentences, not short of breath. No audible wheezing or coughing. Speech pattern normal.  ASSESSMENT & PLAN:    1.  Frequent outflow tract PVCs.  She is relatively stable at this time with suppression on flecainide and normal LVEF.  She has a history of symptomatic bradycardia on beta-blockers.  Continue with current plan.  2.  Leg pain and weakness, lower extremity arterial Dopplers and ABIs will be obtained.  3.  Mild coronary atherosclerosis by cardiac CTA.  She is on aspirin and statin  therapy.  4.  History of PSVT.  No obvious recurrences.  COVID-19 Education: The signs and symptoms of COVID-19 were discussed with the patient and how to seek care for testing (follow up with PCP or arrange E-visit).  The importance of social distancing was discussed today.  Time:   Today, I have spent 8 minutes with the patient with telehealth technology discussing the above problems.     Medication Adjustments/Labs and Tests Ordered: Current medicines are reviewed at length with the patient today.  Concerns regarding medicines are outlined above.   Tests Ordered: No orders of the defined types were placed in this encounter.   Medication Changes: No orders of the defined types were placed in this encounter.   Follow Up:  In Person 4 months in the [place] office.  Signed, Rozann Lesches, MD  06/08/2019 10:52 AM    [place]

## 2019-06-08 ENCOUNTER — Other Ambulatory Visit: Payer: Self-pay

## 2019-06-08 ENCOUNTER — Telehealth (INDEPENDENT_AMBULATORY_CARE_PROVIDER_SITE_OTHER): Payer: Medicare Other | Admitting: Cardiology

## 2019-06-08 ENCOUNTER — Other Ambulatory Visit: Payer: Self-pay | Admitting: Cardiology

## 2019-06-08 ENCOUNTER — Encounter: Payer: Self-pay | Admitting: Cardiology

## 2019-06-08 VITALS — BP 128/71 | HR 66 | Ht 65.0 in | Wt 146.0 lb

## 2019-06-08 DIAGNOSIS — R29898 Other symptoms and signs involving the musculoskeletal system: Secondary | ICD-10-CM

## 2019-06-08 DIAGNOSIS — I10 Essential (primary) hypertension: Secondary | ICD-10-CM

## 2019-06-08 DIAGNOSIS — I2510 Atherosclerotic heart disease of native coronary artery without angina pectoris: Secondary | ICD-10-CM

## 2019-06-08 DIAGNOSIS — I739 Peripheral vascular disease, unspecified: Secondary | ICD-10-CM

## 2019-06-08 DIAGNOSIS — M79605 Pain in left leg: Secondary | ICD-10-CM

## 2019-06-08 DIAGNOSIS — I493 Ventricular premature depolarization: Secondary | ICD-10-CM | POA: Diagnosis not present

## 2019-06-08 DIAGNOSIS — I471 Supraventricular tachycardia: Secondary | ICD-10-CM | POA: Diagnosis not present

## 2019-06-08 DIAGNOSIS — M79604 Pain in right leg: Secondary | ICD-10-CM | POA: Diagnosis not present

## 2019-06-08 NOTE — Patient Instructions (Signed)
Medication Instructions:  Your physician recommends that you continue on your current medications as directed. Please refer to the Current Medication list given to you today.   Labwork: NONE  Testing/Procedures: Your physician has requested that you have an ankle brachial index (ABI). During this test an ultrasound and blood pressure cuff are used to evaluate the arteries that supply the arms and legs with blood. Allow thirty minutes for this exam. There are no restrictions or special instructions.  LOWER EXTREMITY DOPPLER   Follow-Up: Your physician recommends that you schedule a follow-up appointment in: 4 MONTHS    Any Other Special Instructions Will Be Listed Below (If Applicable).     If you need a refill on your cardiac medications before your next appointment, please call your pharmacy.

## 2019-06-27 ENCOUNTER — Telehealth: Payer: Medicare Other | Admitting: Cardiology

## 2019-07-05 ENCOUNTER — Ambulatory Visit: Payer: Medicare Other

## 2019-07-05 ENCOUNTER — Other Ambulatory Visit: Payer: Self-pay

## 2019-07-05 ENCOUNTER — Ambulatory Visit (INDEPENDENT_AMBULATORY_CARE_PROVIDER_SITE_OTHER): Payer: Medicare Other

## 2019-07-05 DIAGNOSIS — I739 Peripheral vascular disease, unspecified: Secondary | ICD-10-CM | POA: Diagnosis not present

## 2019-10-02 IMAGING — CT CT HEART MORPH/PULM VEIN W/ CM & W/O CA SCORE
4 of 7 series · 8 of 20 positions shown, 9 images · IV contrast (APPLIED)
Comparison: None.

EXAM:
OVER-READ INTERPRETATION  CT CHEST

The following report is an over-read performed by radiologist Dr.
over-read does not include interpretation of cardiac or coronary
anatomy or pathology. The coronary calcium score and cardiac CTA
interpretation by the cardiologist is attached.
CLINICAL DATA: 77-year-old female with atypical chest pain and
known non-obstructive CAD.
Cardiac/Coronary  CT
TECHNIQUE: The patient was scanned on a [employer] Force scanner.

[Series 6: best diast 71 % · axial · 0.31mm/px · z∈[+1120,+1173]mm · 2 of 402 slices shown, 3 images]
[im 134/402  vessel]
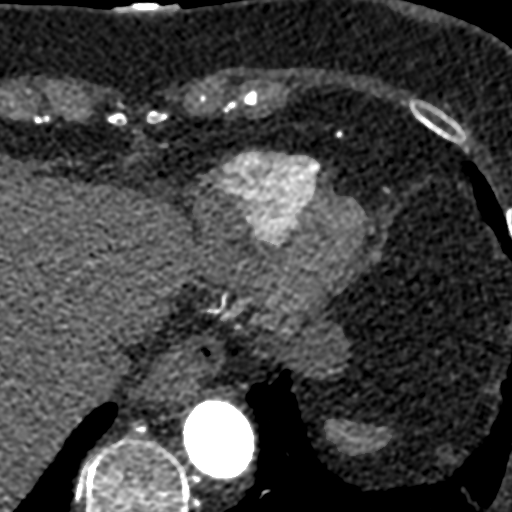
[im 134/402  lung]
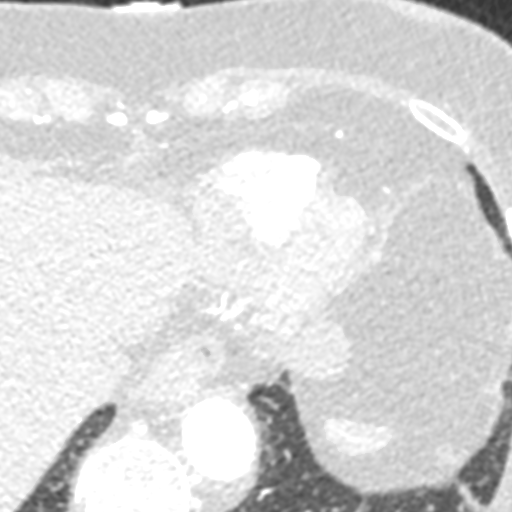
[im 268/402  vessel]
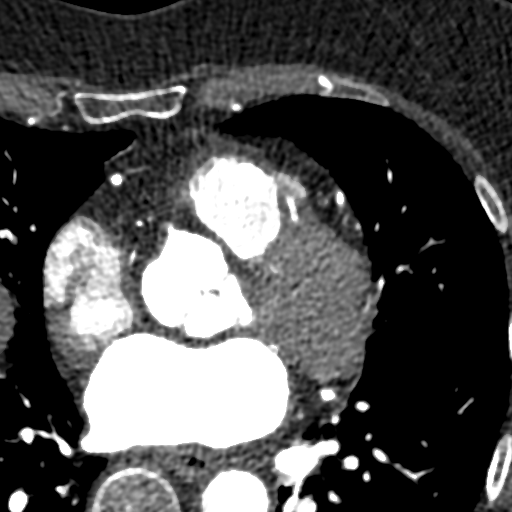

[Series 7: best syst 39 % · axial · 0.31mm/px · z∈[+1120,+1173]mm · 2 of 402 slices shown]
[im 134/402  vessel]
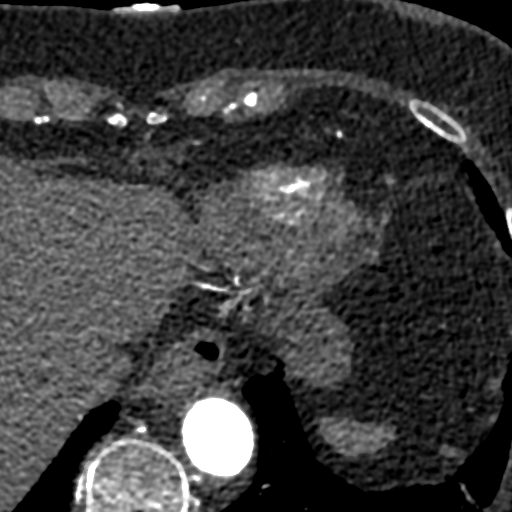
[im 268/402  vessel]
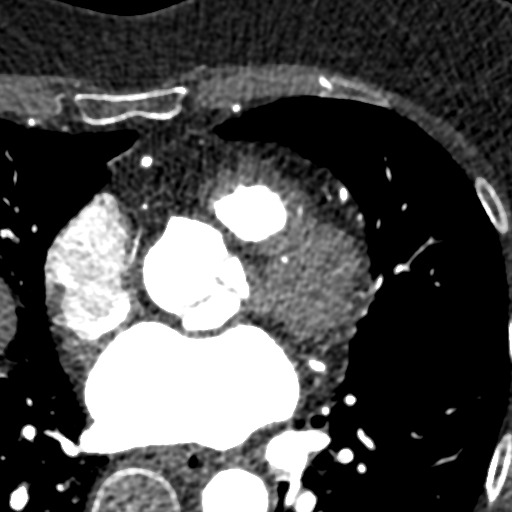

[Series 8: ts diast sharp 71 % · axial · 0.31mm/px · z∈[+1120,+1173]mm · 2 of 402 slices shown]
[im 134/402  lung]
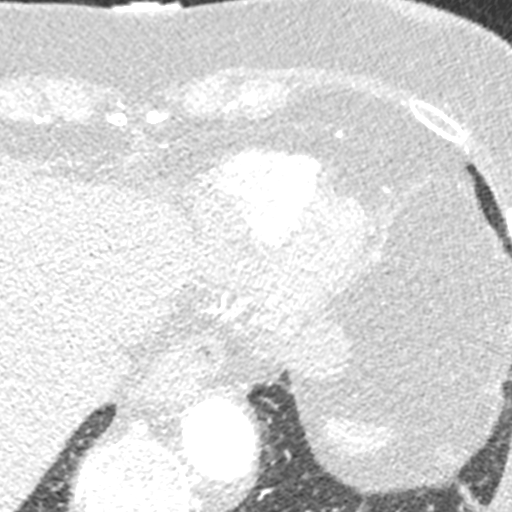
[im 268/402  lung]
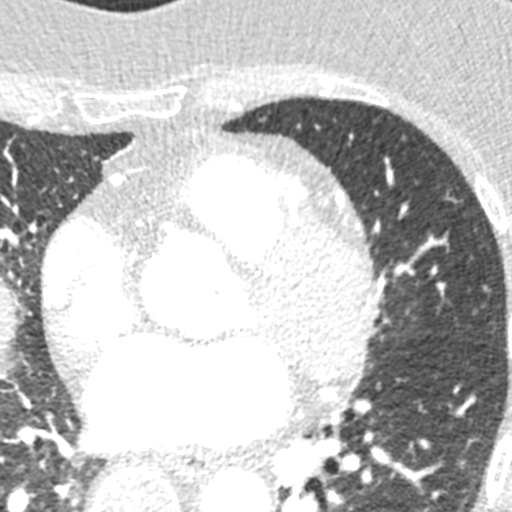

[Series 9: ts syst sharp 39 % · axial · 0.31mm/px · z∈[+1120,+1173]mm · 2 of 402 slices shown]
[im 134/402  lung]
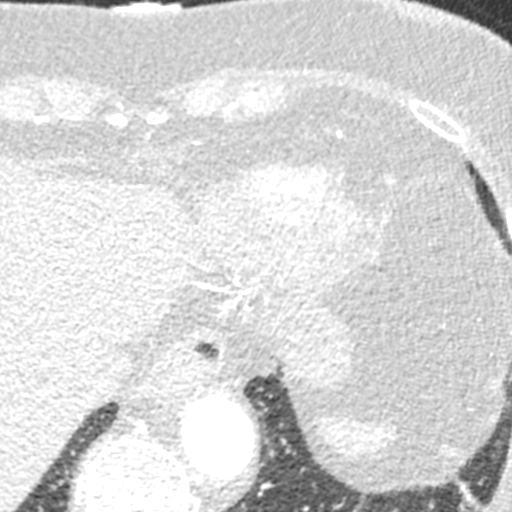
[im 268/402  lung]
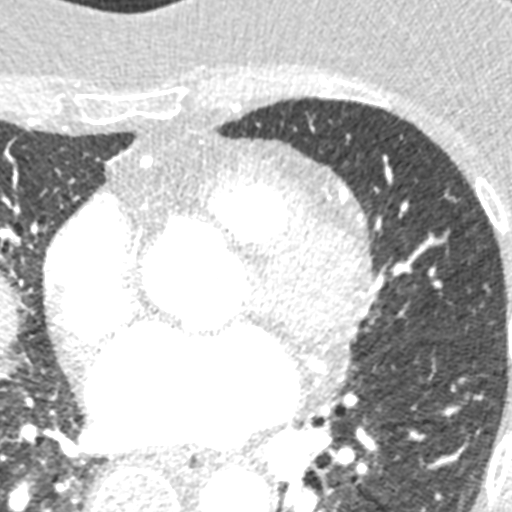

[8 of 20 positions shown; findings below may reference images not displayed]

FINDINGS: Aortic atherosclerosis. Within the visualized portions of the thorax
there are no suspicious appearing pulmonary nodules or masses, there
is no acute consolidative airspace disease, no pleural effusions, no
pneumothorax and no lymphadenopathy. Visualized portions of the
upper abdomen are unremarkable. There are no aggressive appearing
lytic or blastic lesions noted in the visualized portions of the
skeleton.
IMPRESSION: Aortic Atherosclerosis (IAX16-BH0.0).

ADDENDUM:
Correction to the original read: Calcium score is 161 that
represents 66 percentile for age/sex (not 0 as stated above).
FINDINGS: A 120 kV prospective scan was triggered in the descending thoracic
aorta at 111 HU's. Axial non-contrast 3 mm slices were carried out
through the heart. The data set was analyzed on a dedicated work
station and scored using the Agatson method. Gantry rotation speed
was 250 msecs and collimation was .6 mm. No beta blockade and 0.4 mg
of sl NTG was given. The 3D data set was reconstructed in 5%
intervals of the 67-82 % of the R-R cycle. Diastolic phases were
analyzed on a dedicated work station using MPR, MIP and VRT modes.
The patient received 80 cc of contrast.

Aorta:  Normal size.  No calcifications.  No dissection.

Aortic Valve:  Trileaflet.  No calcifications.

Coronary Arteries:  Normal coronary origin.  Right dominance.

RCA is a large dominant artery that gives rise to PDA and PLVB.
There is minimal plaque.

Left main is a large artery that gives rise to LAD and LCX arteries.
Left main has no plaque.

LAD is a large vessel that gives rise to two small diagonal
arteries. There is minimal calcified plaque in the proximal LAD with
associated stenosis 0-25%. There is mild calcified plaque in the
proximal to mid LAD with stenosis 25-50%. Mid to distal LAD has no
plaque.

D1 and D2 are small arteries with no significant stenosis.

LCX is a non-dominant artery that gives rise to one large OM1
branch. There is no plaque.

Other findings:

Normal pulmonary vein drainage into the left atrium.

Normal let atrial appendage without a thrombus.

Normal size of the pulmonary artery.
IMPRESSION: 1. Coronary calcium score of 0. This was 0 percentile for age and
sex matched control.

2. Normal coronary origin with right dominance.

3. Mild CAD in the proximal to mid LAD with stenosis 25-50%.
Continue aggressive medical management.

## 2019-10-03 ENCOUNTER — Telehealth (INDEPENDENT_AMBULATORY_CARE_PROVIDER_SITE_OTHER): Payer: Medicare Other | Admitting: Cardiology

## 2019-10-03 ENCOUNTER — Encounter: Payer: Self-pay | Admitting: Cardiology

## 2019-10-03 DIAGNOSIS — E782 Mixed hyperlipidemia: Secondary | ICD-10-CM | POA: Diagnosis not present

## 2019-10-03 DIAGNOSIS — I471 Supraventricular tachycardia: Secondary | ICD-10-CM

## 2019-10-03 DIAGNOSIS — I493 Ventricular premature depolarization: Secondary | ICD-10-CM | POA: Diagnosis not present

## 2019-10-03 DIAGNOSIS — I2510 Atherosclerotic heart disease of native coronary artery without angina pectoris: Secondary | ICD-10-CM | POA: Diagnosis not present

## 2019-10-03 MED ORDER — SIMVASTATIN 20 MG PO TABS: 20.0000 mg | ORAL_TABLET | Freq: Every day | ORAL | 3 refills | Status: DC

## 2019-10-03 NOTE — Progress Notes (Signed)
Virtual Visit via Telephone Note   This visit type was conducted due to national recommendations for restrictions regarding the COVID-19 Pandemic (e.g. social distancing) in an effort to limit this patient's exposure and mitigate transmission in our community.  Due to her co-morbid illnesses, this patient is at least at moderate risk for complications without adequate follow up.  This format is felt to be most appropriate for this patient at this time.  The patient did not have access to video technology/had technical difficulties with video requiring transitioning to audio format only (telephone).  All issues noted in this document were discussed and addressed.  No physical exam could be performed with this format.  Please refer to the patient's chart for her  consent to telehealth for [facility].   Date:  10/03/2019   ID:  Alejandra Singh, DOB 1941-06-10, MRN [id number]  Patient Location: Home Provider Location: Office  PCP:  [facility] [place]  Cardiologist:  Rozann Lesches, MD Electrophysiologist:  None   Evaluation Performed:  Follow-Up Visit  Chief Complaint:  Cardiac follow-up  History of Present Illness:    Alejandra Singh is a 78 y.o. female last assessed via telehealth encounter August.  We spoke by phone today.  She tells me that she has not been bothered by any significant degree of palpitations or chest pain on current medical regimen.  She has not had any syncope.  Lower extremity ABIs in September were normal.  She was reassured by this and has been doing some exercises to improve her leg strength.  We did go over her medications today.  She ran out of Zocor few weeks ago.  Prior to refilling it, I would like to look at her most recent lipid numbers to make sure that we do not need to change to a more potent statin.  Ideally I would like to get her LDL closer to 70 with known atherosclerosis.  The patient does not have symptoms concerning for COVID-19  infection (fever, chills, cough, or new shortness of breath).    Past Medical History:  Diagnosis Date  . Essential hypertension   . FH: colonic polyps   . Frequent PVCs    RV outflow tract  . GERD (gastroesophageal reflux disease)   . Hyperlipidemia   . Hypothyroidism   . Iron deficiency anemia   . Non-obstructive CAD (coronary artery disease)    a. 08/2015 ETT: Ex time 6:18. No ST/T changes;  b. 09/2016 Cath: LM nl, LAD 50d, small bridging, LCX nl, RCA nl, EF 55-65%-->Med Rx.  . Osteoporosis   . 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 ANKLE SURGERY     Fracture  . CARDIAC CATHETERIZATION N/A 09/22/2016   Procedure: Left Heart Cath and Coronary Angiography;  Surgeon: Leonie Man, MD;  Location: [place] CV LAB;  Service: Cardiovascular;  Laterality: N/A;  . COLONOSCOPY WITH PROPOFOL N/A 07/05/2015   Procedure: COLONOSCOPY WITH PROPOFOL;  Surgeon: Manya Silvas, MD;  Location: [facility] ENDOSCOPY;  Service: Endoscopy;  Laterality: N/A;  . DILATION AND CURETTAGE OF UTERUS    . ESOPHAGOGASTRODUODENOSCOPY (EGD) WITH PROPOFOL N/A 03/19/2016   Procedure: ESOPHAGOGASTRODUODENOSCOPY (EGD) WITH PROPOFOL;  Surgeon: Manya Silvas, MD;  Location: [facility] ENDOSCOPY;  Service: Endoscopy;  Laterality: N/A;  . TONSILLECTOMY       Current Meds  Medication Sig  . amLODipine (NORVASC) 2.5 MG tablet Take 1 tablet (  2.5 mg total) by mouth daily.  Marland Kitchen aspirin EC 81 MG tablet Take 81 mg by mouth daily.  Marland Kitchen esomeprazole (NEXIUM) 40 MG capsule Take 40 mg by mouth as needed (reflux).   . flecainide (TAMBOCOR) 50 MG tablet Take 1 tablet (50 mg total) by mouth 2 (two) times daily.  Marland Kitchen levothyroxine (SYNTHROID, LEVOTHROID) 50 MCG tablet Take 50 mcg by mouth daily.    . Multiple Vitamins-Minerals (MULTIVITAL) tablet Take 1 tablet by mouth daily.    . simvastatin (ZOCOR) 20 MG tablet Take 1 tablet (20 mg total) by  mouth daily.  . [DISCONTINUED] simvastatin (ZOCOR) 20 MG tablet Take 1 tablet (20 mg total) by mouth daily.  . [DISCONTINUED] simvastatin (ZOCOR) 20 MG tablet Take 1 tablet (20 mg total) by mouth daily.     Allergies:   Alendronate   Social History   Tobacco Use  . Smoking status: Never Smoker  . Smokeless tobacco: Never Used  Substance Use Topics  . Alcohol use: No  . Drug use: No     Family Hx: The patient's family history includes Alzheimer's disease in her mother; Dementia in her mother; Stroke in her father. There is no history of Coronary artery disease.  ROS:   Please see the history of present illness. All other systems reviewed and are negative.   Prior CV studies:   The following studies were reviewed today:  Echocardiogram 03/27/2018: Study Conclusions  - Left ventricle: The cavity size was normal. Wall thickness was normal. Systolic function was normal. The estimated ejection fraction was in the range of 50% to 55%. - Mitral valve: There was mild regurgitation.  GXT 04/27/2018:  Blood pressure demonstrated a hypertensive response to exercise.  There was no new ST segment deviation noted during stress. Baseline inferior and lateral precordial ST/T changes more prominent with stress, nonspecific findings  Isolated PVCs and bigeminy at rest, resolve with exercise, reoccur in recovery. No NSVT or VT  No significant PR or QRS prolongation on flecanide therapy. No exericse incuded arrhythmias  Cardiac CT 03/26/2018: IMPRESSION: 1. Coronary calcium score of 0. This was 0 percentile for age and sex matched control.  2. Normal coronary origin with right dominance.  3. Mild CAD in the proximal to mid LAD with stenosis 25-50%. Continue aggressive medical management.  24-hour Holter monitor 08/24/2018: 24-hour Holter monitor reviewed. Sinus rhythm present. Heart rate ranged from 49 bpm up to 105 bpm with average heart rate 63 bpm. Occasional PVCs noted  with some ventricular trigeminy and a single couplet. Ventricular events represented only 3.7% of total beats. There were no sustained arrhythmias or pauses.  Lower extremity ABI 07/05/2019: Summary: Right: Resting right ankle-brachial index is within normal range. No evidence of significant right lower extremity arterial disease. The right toe-brachial index is normal.  Left: Resting left ankle-brachial index is within normal range. No evidence of significant left lower extremity arterial disease. The left toe-brachial index is normal.  Labs/Other Tests and Data Reviewed:    EKG:  An ECG dated 04/14/2018 was personally reviewed today and demonstrated:  Sinus bradycardia with nonspecific ST changes.  Recent Labs:  October 2019: Hemoglobin 13.7, platelets 257, BUN 13, creatinine 0.68, potassium 4.4, AST 17, ALT 18, cholesterol 199, triglycerides 121, HDL 61, LDL 114  Wt Readings from Last 3 Encounters:  10/03/19 147 lb (66.7 kg)  06/08/19 146 lb (66.2 kg)  12/23/18 147 lb (66.7 kg)     Objective:    Vital Signs:  BP 136/69  Pulse 69   Ht 5\' 5"  (1.651 m)   Wt 147 lb (66.7 kg)   BMI 24.46 kg/m    Patient spoke in full sentences, not short of breath at rest. No audible wheezing or coughing. Speech pattern normal.  ASSESSMENT & PLAN:    1.  History of frequent PVCs, outflow tract origin.  She is doing well without progressive palpitations, no history of syncope.  Plan to continue flecainide as before.  She has a history of symptomatic bradycardia on beta-blockers and therefore this is not part of her current regimen.  2.  Mixed hyperlipidemia, currently on Zocor 20 mg daily.  She ran out a few weeks ago.  We will obtain her most recent lipid panel from PCP and determine next step.  Ideally, would like to get LDL closer to 70 with atherosclerosis.  3.  Mild coronary atherosclerosis by cardiac CTA as outlined above.  She does not report any active palpitations.  Continue aspirin  and statin therapy.  4.  History of reentrant SVT.  Currently asymptomatic.  COVID-19 Education: The signs and symptoms of COVID-19 were discussed with the patient and how to seek care for testing (follow up with PCP or arrange E-visit).  The importance of social distancing was discussed today.  Time:   Today, I have spent 8 minutes with the patient with telehealth technology discussing the above problems.     Medication Adjustments/Labs and Tests Ordered: Current medicines are reviewed at length with the patient today.  Concerns regarding medicines are outlined above.   Tests Ordered: No orders of the defined types were placed in this encounter.   Follow Up:  Either In Person or Virtual 6 months.  Signed, Rozann Lesches, MD  10/03/2019 1:00 PM    [place]

## 2019-10-03 NOTE — Patient Instructions (Signed)
Medication Instructions:  HOLD Zocor until we get your lab work *If you need a refill on your cardiac medications before your next appointment, please call your pharmacy*  Lab Work: None today  If you have labs (blood work) drawn today and your tests are completely normal, you will receive your results only by: Marland Kitchen MyChart Message (if you have MyChart) OR . A paper copy in the mail If you have any lab test that is abnormal or we need to change your treatment, we will call you to review the results.  Testing/Procedures: None today  Follow-Up: At [facility], you and your health needs are our priority.  As part of our continuing mission to provide you with exceptional heart care, we have created designated Provider Care Teams.  These Care Teams include your primary Cardiologist (physician) and Advanced Practice Providers (APPs -  Physician Assistants and Nurse Practitioners) who all work together to provide you with the care you need, when you need it.  Your next appointment:   6 month(s)  The format for your next appointment:   Virtual Visit   Provider:   Rozann Lesches, MD  Other Instructions None    Thank you for choosing [facility] !

## 2019-10-23 ENCOUNTER — Telehealth: Payer: Self-pay

## 2019-10-23 DIAGNOSIS — E782 Mixed hyperlipidemia: Secondary | ICD-10-CM

## 2019-10-23 MED ORDER — ROSUVASTATIN CALCIUM 10 MG PO TABS: 10.0000 mg | ORAL_TABLET | Freq: Every day | ORAL | 3 refills | Status: DC

## 2019-10-23 NOTE — Telephone Encounter (Signed)
Per pt labs. Stop simvastatin, start crestor 10 mg daily.

## 2020-03-08 ENCOUNTER — Other Ambulatory Visit: Payer: Self-pay | Admitting: Cardiology

## 2020-03-25 ENCOUNTER — Other Ambulatory Visit: Payer: Self-pay | Admitting: Obstetrics and Gynecology

## 2020-04-08 ENCOUNTER — Encounter: Payer: Self-pay | Admitting: Cardiology

## 2020-04-08 ENCOUNTER — Telehealth (INDEPENDENT_AMBULATORY_CARE_PROVIDER_SITE_OTHER): Payer: Medicare Other | Admitting: Cardiology

## 2020-04-08 ENCOUNTER — Encounter: Payer: Self-pay | Admitting: *Deleted

## 2020-04-08 VITALS — BP 128/78 | HR 64 | Ht 65.0 in | Wt 148.0 lb

## 2020-04-08 DIAGNOSIS — Z01810 Encounter for preprocedural cardiovascular examination: Secondary | ICD-10-CM

## 2020-04-08 DIAGNOSIS — I493 Ventricular premature depolarization: Secondary | ICD-10-CM | POA: Diagnosis not present

## 2020-04-08 DIAGNOSIS — E782 Mixed hyperlipidemia: Secondary | ICD-10-CM

## 2020-04-08 NOTE — Progress Notes (Signed)
Virtual Visit via Telephone Note   This visit type was conducted due to national recommendations for restrictions regarding the COVID-19 Pandemic (e.g. social distancing) in an effort to limit this patient's exposure and mitigate transmission in our community.  Due to her co-morbid illnesses, this patient is at least at moderate risk for complications without adequate follow up.  This format is felt to be most appropriate for this patient at this time.  The patient did not have access to video technology/had technical difficulties with video requiring transitioning to audio format only (telephone).  All issues noted in this document were discussed and addressed.  No physical exam could be performed with this format.  Please refer to the patient's chart for her  consent to telehealth for [facility].   The patient was identified using 2 identifiers.  Date:  04/08/2020   ID:  Alejandra, Singh Mar 05, 1941, MRN [id number]  Patient Location: Home Provider Location: Office  PCP:  [facility], Alejandra Singh  Cardiologist:  Rozann Lesches, MD Electrophysiologist:  None   Evaluation Performed:  Follow-Up Visit  Chief Complaint:   Cardiac follow-up  History of Present Illness:    Alejandra Singh is a 79 y.o. female last assessed via telehealth encounter in December 2020.  We spoke by phone today.  She continues to do well from a cardiac perspective, no palpitations, chest pain, or syncope.  She has tolerated Crestor and had recent lab work per PCP with LDL down to 67.  The remainder of her cardiac regimen is stable and outlined below.  She states that she did have a recent ECG done per PCP office which we are requesting for review.  She has also been evaluated by Dr. Ouida Sills at the [facility] with plan for a vaginal hysterectomy in July.  I do not anticipate any further ischemic preoperative assessment, she describes activities exceeding 4 METS without symptoms and had a  reassuring cardiac CTA in 2019.   Past Medical History:  Diagnosis Date   Essential hypertension    FH: colonic polyps    Frequent PVCs    RV outflow tract   GERD (gastroesophageal reflux disease)    Hyperlipidemia    Hypothyroidism    Iron deficiency anemia    Non-obstructive CAD (coronary artery disease)    a. 08/2015 ETT: Ex time 6:18. No ST/T changes;  b. 09/2016 Cath: LM nl, LAD 50d, small bridging, LCX nl, RCA nl, EF 55-65%-->Med Rx.   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   Past Surgical History:  Procedure Laterality Date   ANKLE SURGERY     Fracture   CARDIAC CATHETERIZATION N/A 09/22/2016   Procedure: Left Heart Cath and Coronary Angiography;  Surgeon: Leonie Man, MD;  Location: [place] CV LAB;  Service: Cardiovascular;  Laterality: N/A;   COLONOSCOPY WITH PROPOFOL N/A 07/05/2015   Procedure: COLONOSCOPY WITH PROPOFOL;  Surgeon: Manya Silvas, MD;  Location: [facility] ENDOSCOPY;  Service: Endoscopy;  Laterality: N/A;   DILATION AND CURETTAGE OF UTERUS     ESOPHAGOGASTRODUODENOSCOPY (EGD) WITH PROPOFOL N/A 03/19/2016   Procedure: ESOPHAGOGASTRODUODENOSCOPY (EGD) WITH PROPOFOL;  Surgeon: Manya Silvas, MD;  Location: [facility] ENDOSCOPY;  Service: Endoscopy;  Laterality: N/A;   TONSILLECTOMY       Current Meds  Medication Sig   amLODipine (NORVASC) 2.5 MG tablet Take 1 tablet by mouth once daily   aspirin EC 81 MG tablet Take 81 mg by  mouth daily.   esomeprazole (NEXIUM) 40 MG capsule Take 40 mg by mouth as needed (reflux).    flecainide (TAMBOCOR) 50 MG tablet Take 1 tablet (50 mg total) by mouth 2 (two) times daily.   levothyroxine (SYNTHROID, LEVOTHROID) 50 MCG tablet Take 50 mcg by mouth daily.     Multiple Vitamins-Minerals (MULTIVITAL) tablet Take 1 tablet by mouth daily.     rosuvastatin (CRESTOR) 10 MG tablet Take 1 tablet (10 mg total) by mouth daily.     Allergies:    Alendronate   ROS:   No palpitations or syncope.  Prior CV studies:   The following studies were reviewed today:  Echocardiogram 03/27/2018: Study Conclusions  - Left ventricle: The cavity size was normal. Wall thickness was normal. Systolic function was normal. The estimated ejection fraction was in the range of 50% to 55%. - Mitral valve: There was mild regurgitation.  Cardiac CT 03/26/2018: IMPRESSION: 1. Coronary calcium score of 0. This was 0 percentile for age and sex matched control.  2. Normal coronary origin with right dominance.  3. Mild CAD in the proximal to mid LAD with stenosis 25-50%. Continue aggressive medical management.  Labs/Other Tests and Data Reviewed:    EKG:  An ECG dated 04/14/2018 was personally reviewed today and demonstrated:  Sinus bradycardia with nonspecific ST changes.  Recent Labs:  May 2021: Hemoglobin 13.9, platelets 308, BUN 12, creatinine 0.61, potassium 4.6, AST 19, ALT 17, cholesterol 158, triglycerides 125, HDL 69, LDL 67, TSH 2.24  Wt Readings from Last 3 Encounters:  04/08/20 148 lb (67.1 kg)  10/03/19 147 lb (66.7 kg)  06/08/19 146 lb (66.2 kg)     Objective:    Vital Signs:  BP 128/78    Pulse 64    Ht 5\' 5"  (1.651 m)    Wt 148 lb (67.1 kg)    BMI 24.63 kg/m    Patient spoke in full sentences, not short of breath.  ASSESSMENT & PLAN:    1.  History of frequent PVCs from the outflow tract, doing very well on flecainide for suppressive therapy.  She has normal LVEF and nonobstructive coronary atherosclerosis by cardiac CTA.  Requesting recent ECG from PCP.  2.  Preoperative cardiac evaluation prior to elective vaginal hysterectomy in July.  She reports no major change in functional capacity, activity exceeding 4 METS, no palpitations or syncope, no angina symptoms.  Plan to review ECG from PCP, do not anticipate any further cardiac testing however.  3.  Mild coronary atherosclerosis by cardiac CTA as reviewed above.  She  is asymptomatic and continues on aspirin and statin therapy.  4.  Mixed hyperlipidemia, tolerating Crestor with recent LDL 67.  Time:   Today, I have spent 6 minutes with the patient with telehealth technology discussing the above problems.     Medication Adjustments/Labs and Tests Ordered: Current medicines are reviewed at length with the patient today.  Concerns regarding medicines are outlined above.   Tests Ordered: No orders of the defined types were placed in this encounter.   Medication Changes: No orders of the defined types were placed in this encounter.   Follow Up:  In Person 6 months in the [place] office.  Signed, Rozann Lesches, MD  04/08/2020 11:49 AM    [place]

## 2020-04-08 NOTE — Patient Instructions (Signed)

## 2020-04-18 NOTE — H&P (Signed)
Alejandra Singh is a 79 y.o. female here forTVH BSO and anterior colporrhaphy   She is interested in definitive surgical repair .     Past Medical History:  has a past medical history of Gastroesophageal reflux disease with esophagitis (04/02/2016), High cholesterol, Osteoporosis, PSVT (paroxysmal supraventricular tachycardia) (CMS-HCC), PVC (premature ventricular contraction), Tachycardia, and Thyroid disease.  Past Surgical History:  has a past surgical history that includes Colonoscopy (11/01/2003); Colonoscopy (03/12/2010); Ankle surgery (2006); Colonoscopy (07/05/2015); Tonsillectomy; egd (03/19/2016); and Dilation and curettage of uterus. Family History: family history includes Cirrhosis in her brother; Colon cancer in her brother; Fibromyalgia in her brother; Gallbladder disease in her mother; Liver disease in her brother; Lupus in her brother. Social History:  reports that she has never smoked. She has never used smokeless tobacco. She reports that she does not drink alcohol. OB/GYN History:          OB History    Gravida  2   Para  2   Term      Preterm      AB      Living  2     SAB      TAB      Ectopic      Molar      Multiple      Live Births  2          Allergies: is allergic to alendronate. Medications:  Current Outpatient Medications:  .  esomeprazole (NEXIUM) 40 MG DR capsule, Take 40 mg by mouth once daily.  , Disp: , Rfl:  .  flecainide (TAMBOCOR) 50 MG tablet, Take 50 mg by mouth 2 (two) times daily, Disp: , Rfl:  .  levothyroxine (SYNTHROID) 50 MCG tablet, Take 50 mcg by mouth once daily Take on an empty stomach with a glass of water at least 30-60 minutes before breakfast., Disp: , Rfl:  .  levothyroxine (SYNTHROID, LEVOTHROID) 50 MCG tablet, Take 50 mcg by mouth once daily.  , Disp: , Rfl:  .  rosuvastatin (CRESTOR) 10 MG tablet, Take 10 mg by mouth once daily, Disp: , Rfl:   Review of Systems: General:                      No fatigue or  weight loss Eyes:                           No vision changes Ears:                            No hearing difficulty Respiratory:                No cough or shortness of breath Pulmonary:                  No asthma or shortness of breath Cardiovascular:           No chest pain, palpitations, dyspnea on exertion Gastrointestinal:          No abdominal bloating, chronic diarrhea, constipations, masses, pain or hematochezia Genitourinary:             No hematuria, dysuria, abnormal vaginal discharge, pelvic pain, Menometrorrhagia Lymphatic:                   No swollen lymph nodes Musculoskeletal:         No muscle weakness Neurologic:  No extremity weakness, syncope, seizure disorder Psychiatric:                  No history of depression, delusions or suicidal/homicidal ideation    Exam:      Vitals:   03/20/20 1447  BP: 130/80  Pulse: 66    Body mass index is 25.13 kg/m.  WDWN white/ female in NAD   Lungs: CTA  CV : RRR without murmur   Breast: exam done in sitting and lying position : No dimpling or retraction, no dominant mass, no spontaneous discharge, no axillary adenopathy Neck:  no thyromegaly Abdomen: soft , no mass, normal active bowel sounds,  non-tender, no rebound tenderness Pelvic: tanner stage 5 ,  External genitalia: vulva /labia no lesions Urethra: no prolapse Vagina: normal physiologic d/c, grade 2-3 cystocele , grade 2 uterine decscensus Cervix: no lesions, no cervical motion tenderness  Uterus:Descensus as abovenormal size shape and contour, non-tender Adnexa:no mass , non tender  Impression:   The primary encounter diagnosis was Cystocele, midline. A diagnosis of Uterus descensus was also pertinent to this visit.    Plan:   Benefits and risks to surgery: TVH / BSo and anterior colporrhaphy  Pt is aware of the possible risks of urinary retention and the role of self catheterization .  The proposed benefit of the  surgery has been discussed with the patient. The possible risks include, but are not limited to: organ injury to the bowel , bladder, ureters, and major blood vessels and nerves. There is a possibility of additional surgeries resulting from these injuries. There is also the risk of blood transfusion and the need to receive blood products during or after the procedure which may rarely lead to HIV or Hepatitis C infection. There is a risk of developing a deep venous thrombosis or a pulmonary embolism . There is the possibility of wound infection and also anesthetic complications, even the rare possibility of death. The patient understands these risks and wishes to proceed. All questions have been answered and the consent has been signed.     Caroline Sauger, MD        Electronically signed by Caroline Sauger, MD on 03/20/2020 3:28 PM

## 2020-04-23 ENCOUNTER — Encounter
Admission: RE | Admit: 2020-04-23 | Discharge: 2020-04-23 | Disposition: A | Discharge status: Home / Self Care | Payer: Medicare Other | Source: Ambulatory Visit | Attending: Obstetrics and Gynecology | Admitting: Obstetrics and Gynecology

## 2020-04-23 ENCOUNTER — Other Ambulatory Visit: Payer: Self-pay

## 2020-04-23 HISTORY — DX: Unspecified osteoarthritis, unspecified site: M19.90

## 2020-04-23 NOTE — Patient Instructions (Addendum)
Your procedure is scheduled on: Mon 7/12 Report to Day Surgery. To find out your arrival time please call [phone] between 1PM - 3PM on Fri. 7/9  Remember: Instructions that are not followed completely may result in serious medical risk,  up to and including death, or upon the discretion of your surgeon and anesthesiologist your  surgery may need to be rescheduled.     _X__ 1. Do not eat food after midnight the night before your procedure.                 No gum chewing or hard candies. You may drink clear liquids up to 2 hours                 before you are scheduled to arrive for your surgery- DO not drink clear                 liquids within 2 hours of the start of your surgery.                 Clear Liquids include:  water, apple juice without pulp, clear Gatorade, G2 or                  Gatorade Zero (avoid Red/Purple/Blue), Black Coffee or Tea (Do not add                 anything to coffee or tea). __x___2.   Complete the carbohydrate drink provided to you, 2 hours before arrival.  __X__2.  On the morning of surgery brush your teeth with toothpaste and water, you                may rinse your mouth with mouthwash if you wish.  Do not swallow any toothpaste of mouthwash.     ___ 3.  No Alcohol for 24 hours before or after surgery.   ___ 4.  Do Not Smoke or use e-cigarettes For 24 Hours Prior to Your Surgery.                 Do not use any chewable tobacco products for at least 6 hours prior to                 Surgery.  ___  5.  Do not use any recreational drugs (marijuana, cocaine, heroin, ecstacy, MDMA or other)                For at least one week prior to your surgery.  Combination of these drugs with anesthesia                May have life threatening results.  ____  6.  Bring all medications with you on the day of surgery if instructed.   __x__  7.  Notify your doctor if there is any change in your medical condition      (cold, fever,  infections).     Do not wear jewelry, make-up, hairpins, clips or nail polish. Do not wear lotions, powders, or perfumes. You may wear deodorant. Do not shave 48 hours prior to surgery.  Do not bring valuables to the hospital.    [facility] is not responsible for any belongings or valuables.  Contacts, dentures or bridgework may not be worn into surgery. Leave your suitcase in the car. After surgery it may be brought to your room. For patients admitted to the hospital, discharge time is determined by your treatment team.   Patients discharged  the day of surgery will not be allowed to drive home.   Make arrangements for someone to be with you for the first 24 hours of your Same Day Discharge.    Please read over the following fact sheets that you were given: Incentive spirometer   _x___ Take these medicines the morning of surgery with A SIP OF WATER:    1. amLODipine (NORVASC) 2.5 MG tablet  2. levothyroxine (SYNTHROID, LEVOTHROID) 50 MCG tablet  3. flecainide (TAMBOCOR) 50 MG tablet  4.esomeprazole (NEXIUM) 40 MG capsule  5.  6.  ____ Fleet Enema (as directed)   _x___ Shower with your regular soap the night before and the morning of surgery.  No need for special soap.  ____ Use Benzoyl Peroxide Gel as instructed  ____ Use inhalers on the day of surgery  ____ Stop metformin 2 days prior to surgery    ____ Take 1/2 of usual insulin dose the night before surgery. No insulin the morning          of surgery.   __x__ Stopped aspirin already  __x__ Stopped Anti-inflammatories already.  No ibuprofen aleve or aspirin until after surgery.  May take tylenol   ____ Stop supplements until after surgery.    ____ Bring C-Pap to the hospital.

## 2020-04-25 ENCOUNTER — Telehealth: Payer: Self-pay | Admitting: Physician Assistant

## 2020-04-25 ENCOUNTER — Other Ambulatory Visit: Payer: Self-pay

## 2020-04-25 ENCOUNTER — Other Ambulatory Visit
Admission: RE | Admit: 2020-04-25 | Discharge: 2020-04-25 | Disposition: A | Discharge status: Home / Self Care | Payer: Medicare Other | Source: Ambulatory Visit | Attending: Obstetrics and Gynecology | Admitting: Obstetrics and Gynecology

## 2020-04-25 DIAGNOSIS — Z20822 Contact with and (suspected) exposure to covid-19: Secondary | ICD-10-CM | POA: Diagnosis not present

## 2020-04-25 DIAGNOSIS — Z01812 Encounter for preprocedural laboratory examination: Secondary | ICD-10-CM | POA: Insufficient documentation

## 2020-04-25 LAB — BASIC METABOLIC PANEL
Anion gap: 8 (ref 5–15)
BUN: 13 mg/dL (ref 8–23)
CO2: 24 mmol/L (ref 22–32)
Calcium: 9.2 mg/dL (ref 8.9–10.3)
Chloride: 104 mmol/L (ref 98–111)
Creatinine, Ser: 0.63 mg/dL (ref 0.44–1.00)
GFR calc Af Amer: >60 mL/min (ref >60)
GFR calc non Af Amer: >60 mL/min (ref >60)
Glucose, Bld: 67 mg/dL — ABNORMAL LOW (ref 70–99)
Potassium: 4.1 mmol/L (ref 3.5–5.1)
Sodium: 136 mmol/L (ref 135–145)

## 2020-04-25 LAB — CBC
HCT: 37.7 % (ref 36.0–46.0)
Hemoglobin: 13.3 g/dL (ref 12.0–15.0)
MCH: 29.5 pg (ref 26.0–34.0)
MCHC: 35.3 g/dL (ref 30.0–36.0)
MCV: 83.6 fL (ref 80.0–100.0)
Platelets: 232 10*3/uL (ref 150–400)
RBC: 4.51 MIL/uL (ref 3.87–5.11)
RDW: 13.7 % (ref 11.5–15.5)
WBC: 5.3 10*3/uL (ref 4.0–10.5)
nRBC: 0 % (ref 0.0–0.2)

## 2020-04-25 LAB — TYPE AND SCREEN
ABO/RH(D): B POS
Antibody Screen: NEGATIVE

## 2020-04-25 LAB — SARS CORONAVIRUS 2 (TAT 6-24 HRS): SARS Coronavirus 2: NEGATIVE

## 2020-04-25 NOTE — Telephone Encounter (Signed)
1. What type of surgery is being performed? Total vaginal hysterectomy.   2. When is this surgery scheduled? 04/29/2020   3. Type of clearance is required (medical vs. pharmacy clearance to hold med vs. both)? Both    4. Are there any medications that need to be held prior to surgery and how long? ASA    5. Practice name and name of physician performing surgery? [facility] - Dr. Ouida Sills.  Only need to send clearance to PAT.    6. What is the office phone number? [phone]    7. What is the office fax number? [phone]    8. Anesthesia type (None, local, MAC, general)? General   Notes: Patient saw Dr. Domenic Polite on 04/08/2020. Notes indicate plans to clear for surgery in HPI, however in plan note states, "preoperative cardiac evaluation prior to elective vaginal hysterectomy in July. She reports no major change in functional capacity, activity exceeding 4 METS, no palpitations or syncope, no angina symptoms. Plan to review ECG from PCP, do not anticipate any further cardiac testing however".   Honor Loh, MSN, APRN, FNP-C, CEN  [facility]  Peri-operative Services Nurse Practitioner  Phone: [phone]  04/25/20 10:22 AM

## 2020-04-25 NOTE — Telephone Encounter (Signed)
   Primary Cardiologist: Rozann Lesches, MD  Chart reviewed as part of pre-operative protocol coverage. Patient was contacted 04/25/2020 in reference to pre-operative risk assessment for pending surgery as outlined below.  Alejandra Singh was last seen on 04/08/2020 by Dr. Domenic Polite.  Since that day, Alejandra Singh has done well without exertional chest pain. Recent EKG obtained at outside facility has been reviewed, EKG showed sinus bradycardia with minimal T wave inversion in lead V2, when compare to the previous EKG in 2019, it did not change much.   Therefore, based on ACC/AHA guidelines, the patient would be at acceptable risk for the planned procedure without further cardiovascular testing.   I will route this recommendation to the requesting party via Epic fax function and remove from pre-op pool. Please call with questions.  [place], [place] 04/25/2020, 11:14 AM

## 2020-04-25 NOTE — Progress Notes (Signed)
[facility] Perioperative Services  Pre-Admission/Anesthesia Testing Clinical Review  Date: 04/25/20  Patient Demographics:  Name: Alejandra Singh DOB:   Dec 06, 1940 MRN:   [id number]  Planned Surgical Procedure(s):    Case: [id number] Date/Time: 04/29/20 0900   Procedures:      HYSTERECTOMY VAGINAL (N/A )     BILATERAL SALPINGECTOMY (Bilateral )     ANTERIOR REPAIR (CYSTOCELE) (N/A )   Anesthesia type: General   Pre-op diagnosis: uterine descensus, anterior cystocele   Location: [facility] OR ROOM 05 / [place] [facility]   Surgeons: Schermerhorn, Gwen Her, MD     NOTE: Available PAT nursing documentation and vital signs have been reviewed. Clinical nursing staff has updated patient's PMH/PSHx, current medication list, and drug allergies/intolerances to ensure comprehensive history available to assist in medical decision making as it pertains to the aforementioned surgical procedure and anticipated anesthetic course.   Clinical Discussion:  Alejandra Singh is a 79 y.o. female who is submitted for pre-surgical anesthesia review and clearance prior to her undergoing the above procedure. Patient has never been a smoker. 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  Patient is followed by cardiology Domenic Polite, MD). She was last seen in the cardiology clinic on 04/08/2020; notes reviewed. MD noted that patient doing well from a cardiac perspective; no chest pain, palpitations, or syncope. Patient reportedly had a functional capacity that exceeded 4 METS without symptoms. Last CTA in 2019 was reassuring. Recent EKG done with PCP; records requested. Cardiac clearance pending review of EKG, however Dr. Domenic Polite noted that he did not anticipate any further ischemic preoperative assessment.    Patient presented for her PAT appointment today (04/25/2020). Copy of her EKG was  obtained. Medical photography used to place EKG under media for cardiology review. Spoke with cardiology APP to make them aware. Patient reviewed and APP touched base with her to ensure no symptoms or major changes since her visit with Dr. Domenic Polite on 04/08/2020. Patient reportedly stable with no acute changes in condition. EKG tracing reviewed by cardiology provider today. Cardiology clearance for planned surgical/anesthetic course issued as follows "based on ACC/AHA guidelines, the patient would be at acceptable risk for the planned procedure without further cardiovascular testing".  She denies previous intra-operative complications with anesthesia. Patient underwent general anesthetic course here on 03/19/2016 (ASA III) with no documented complications noted. This patient is on daily antiplatelet therapy. She has been instructed on recommendations for holding her daily low dose ASA prior to her procedure.   Vitals with BMI 04/23/2020 04/08/2020 10/03/2019  Height 5\' 5"  5\' 5"  5\' 5"   Weight 148 lbs 148 lbs 147 lbs  BMI 24.63 29.56 21.30  Systolic - 865 784  Diastolic - 78 69  Pulse - 64 69    Providers/Specialists:   PROVIDER ROLE LAST OV  Schermerhorn, Gwen Her, * GYN (Surgeon) 04/18/2020  Center, [facility] Primary Care Provider 04/02/2020  Rozann Lesches, MD Cardiology 04/08/2020   Allergies:  Alendronate  Current Home Medications:   No current facility-administered medications for this encounter.   Marland Kitchen amLODipine (NORVASC) 2.5 MG tablet  . aspirin EC 81 MG tablet  . esomeprazole (NEXIUM) 40 MG capsule  . flecainide (TAMBOCOR) 50 MG tablet  . levothyroxine (SYNTHROID, LEVOTHROID) 50 MCG tablet  . Multiple Vitamins-Minerals (MULTIVITAL) tablet  . rosuvastatin (CRESTOR) 10 MG tablet   History:   Past Medical History:  Diagnosis Date  .  Arthritis   . Essential hypertension   . FH: colonic polyps   . Frequent PVCs    RV outflow tract  . GERD (gastroesophageal  reflux disease)   . Hyperlipidemia   . Hypothyroidism   . Iron deficiency anemia   . Non-obstructive CAD (coronary artery disease)    a. 08/2015 ETT: Ex time 6:18. No ST/T changes;  b. 09/2016 Cath: LM nl, LAD 50d, small bridging, LCX nl, RCA nl, EF 55-65%-->Med Rx.  . Osteoporosis   . 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 ANKLE SURGERY Left    Fracture  . CARDIAC CATHETERIZATION N/A 09/22/2016   Procedure: Left Heart Cath and Coronary Angiography;  Surgeon: Leonie Man, MD;  Location: [facility] CV LAB;  Service: Cardiovascular;  Laterality: N/A;  . COLONOSCOPY WITH PROPOFOL N/A 07/05/2015   Procedure: COLONOSCOPY WITH PROPOFOL;  Surgeon: Manya Silvas, MD;  Location: [facility] ENDOSCOPY;  Service: Endoscopy;  Laterality: N/A;  . DILATION AND CURETTAGE OF UTERUS    . ESOPHAGOGASTRODUODENOSCOPY (EGD) WITH PROPOFOL N/A 03/19/2016   Procedure: ESOPHAGOGASTRODUODENOSCOPY (EGD) WITH PROPOFOL;  Surgeon: Manya Silvas, MD;  Location: [facility] ENDOSCOPY;  Service: Endoscopy;  Laterality: N/A;  . EYE SURGERY Right    cataract  . TONSILLECTOMY     Family History  Problem Relation Age of Onset  . Stroke Father   . Alzheimer's disease Mother   . Dementia Mother   . Coronary artery disease Neg Hx    Social History   Tobacco Use  . Smoking status: Never Smoker  . Smokeless tobacco: Never Used  Vaping Use  . Vaping Use: Never used  Substance Use Topics  . Alcohol use: No  . Drug use: No    Pertinent Clinical Results:  LABS: Labs reviewed: Acceptable for surgery.  Hospital Outpatient Visit on 04/25/2020  Component Date Value Ref Range Status  . Sodium 04/25/2020 136  135 - 145 mmol/L Final  . Potassium 04/25/2020 4.1  3.5 - 5.1 mmol/L Final  . Chloride 04/25/2020 104  98 - 111 mmol/L Final  . CO2 04/25/2020 24  22 - 32 mmol/L Final  . Glucose, Bld 04/25/2020 67* 70 - 99 mg/dL Final     Glucose reference range applies only to samples taken after fasting for at least 8 hours.  . BUN 04/25/2020 13  8 - 23 mg/dL Final  . Creatinine, Ser 04/25/2020 0.63  0.44 - 1.00 mg/dL Final  . Calcium 04/25/2020 9.2  8.9 - 10.3 mg/dL Final  . GFR calc non Af Amer 04/25/2020 >60  >60 mL/min Final  . GFR calc Af Amer 04/25/2020 >60  >60 mL/min Final  . Anion gap 04/25/2020 8  5 - 15 Final   Performed at [facility], [address] [place], [place] [address]  . WBC 04/25/2020 5.3  4.0 - 10.5 K/uL Final  . RBC 04/25/2020 4.51  3.87 - 5.11 MIL/uL Final  . Hemoglobin 04/25/2020 13.3  12.0 - 15.0 g/dL Final  . HCT 04/25/2020 37.7  36 - 46 % Final  . MCV 04/25/2020 83.6  80.0 - 100.0 fL Final  . MCH 04/25/2020 29.5  26.0 - 34.0 pg Final  . MCHC 04/25/2020 35.3  30.0 - 36.0 g/dL Final  . RDW 04/25/2020 13.7  11.5 - 15.5 % Final  . Platelets 04/25/2020 232  150 - 400 K/uL Final  . nRBC 04/25/2020 0.0  0.0 -  0.2 % Final   Performed at [facility], [address] [place], [place] [address]    EKG: Date: 04/02/2020 Time ECG obtained: 1557 PM Rate: 60 bpm Rhythm: normal sinus Axis (leads I and aVF): Normal Intervals: PR 196 ms. QTc 400 ms. Comparison: Similar to previous tracing obtained on 04/14/2018 Note: Tracing obtained by PCP; photograph of tracing under media.    IMAGING / PROCEDURES: HOLTER MONITOR 24HR done on 08/24/2018 1. Sinus rhythm present.   2. Heart rate ranged from 49 bpm up to 105 bpm with average heart rate 63 bpm.   3. Occasional PVCs noted with some ventricular trigeminy and a single couplet.   4. Ventricular events represented only 3.7% of total beats.   5. There were no sustained arrhythmias or pauses.  EXERCISE TOLERANCE TEST done on 04/27/2018 1. Blood pressure demonstrated a hypertensive response to exercise. 2. There was no new ST segment deviation noted during stress. Baseline inferior and lateral precordial ST/T changes more  prominent with stress, nonspecific findings 3. Isolated PVCs and bigeminy at rest, resolve with exercise, reoccur in recovery. No NSVT or VT 4. No significant PR or QRS prolongation on flecanide therapy. No exericse incuded arrhythmias  ECHOCARDIOGRAM done on 03/27/2018 1. Left ventricle: The cavity size was normal. Wall thickness was normal. Systolic function was normal. The estimated ejection fraction was in the range of 50% to 55%.  2. Mitral valve: There was mild regurgitation.  LEFT HEART CATHETERIZATION done on 09/22/2016 1. The left ventricular systolic function is normal. The left ventricular ejection fraction is 55-65% by visual estimate. 2. LV end diastolic pressure is normal. 3. Dist LAD lesion, 50 % stenosed - on exam, with a small bridging segment with kinking. Otherwise normal coronary arteries. 4. Angiographically, I do not see any significant coronary disease expanded the patient's symptoms. The distal LAD lesion is likely not significant, and there is at a bend with kinking suggestive of mild bridging. Not a good PCI target. 5. Patient also had a catheter-induced SVT and was hypertensive during the case. Suggested maybe her symptoms are related to hypertension and SVT related microvascular ischemia.  Impression and Plan:  Alejandra Singh has been referred for pre-anesthesia review and clearance prior her undergoing the planned anesthetic and procedural courses. Available labs, pertinent testing, and imaging results were personally reviewed by me. This patient has been appropriately cleared by cardiology.   Based on clinical review performed today (04/25/20), barring and significant acute changes in the patient's overall condition, it is anticipated that she will be able to proceed with the planned surgical intervention. Any acute changes in clinical condition may necessitate her procedure being postponed and/or cancelled. Pre-surgical instructions were reviewed with the patient  during her PAT appointment and questions were fielded by PAT clinical staff.  Honor Loh, MSN, APRN, FNP-C, CEN [facility]  Peri-operative Services Nurse Practitioner Phone: [phone] 04/25/20 2:07 PM  NOTE: This note has been prepared using Dragon dictation software. Despite my best ability to proofread, there is always the potential that unintentional transcriptional errors may still occur from this process.

## 2020-04-29 ENCOUNTER — Ambulatory Visit: Payer: Medicare Other | Admitting: Urgent Care

## 2020-04-29 ENCOUNTER — Other Ambulatory Visit: Payer: Self-pay

## 2020-04-29 ENCOUNTER — Encounter
Admission: RE | Disposition: A | Discharge status: Home / Self Care | Payer: Self-pay | Source: Home / Self Care | Attending: Obstetrics and Gynecology

## 2020-04-29 ENCOUNTER — Encounter: Payer: Self-pay | Admitting: Obstetrics and Gynecology

## 2020-04-29 ENCOUNTER — Ambulatory Visit
Admission: RE | Admit: 2020-04-29 | Discharge: 2020-04-29 | Disposition: A | Discharge status: Home / Self Care | Payer: Medicare Other | Attending: Obstetrics and Gynecology | Admitting: Obstetrics and Gynecology

## 2020-04-29 DIAGNOSIS — Z8269 Family history of other diseases of the musculoskeletal system and connective tissue: Secondary | ICD-10-CM | POA: Diagnosis not present

## 2020-04-29 DIAGNOSIS — N8111 Cystocele, midline: Secondary | ICD-10-CM | POA: Insufficient documentation

## 2020-04-29 DIAGNOSIS — I2510 Atherosclerotic heart disease of native coronary artery without angina pectoris: Secondary | ICD-10-CM | POA: Insufficient documentation

## 2020-04-29 DIAGNOSIS — I10 Essential (primary) hypertension: Secondary | ICD-10-CM | POA: Diagnosis not present

## 2020-04-29 DIAGNOSIS — M810 Age-related osteoporosis without current pathological fracture: Secondary | ICD-10-CM | POA: Insufficient documentation

## 2020-04-29 DIAGNOSIS — Z8489 Family history of other specified conditions: Secondary | ICD-10-CM | POA: Diagnosis not present

## 2020-04-29 DIAGNOSIS — I471 Supraventricular tachycardia: Secondary | ICD-10-CM | POA: Insufficient documentation

## 2020-04-29 DIAGNOSIS — E039 Hypothyroidism, unspecified: Secondary | ICD-10-CM | POA: Insufficient documentation

## 2020-04-29 DIAGNOSIS — Z8379 Family history of other diseases of the digestive system: Secondary | ICD-10-CM | POA: Insufficient documentation

## 2020-04-29 DIAGNOSIS — E785 Hyperlipidemia, unspecified: Secondary | ICD-10-CM | POA: Diagnosis not present

## 2020-04-29 DIAGNOSIS — Z800 Family history of malignant neoplasm of digestive organs: Secondary | ICD-10-CM | POA: Insufficient documentation

## 2020-04-29 DIAGNOSIS — Z888 Allergy status to other drugs, medicaments and biological substances status: Secondary | ICD-10-CM | POA: Diagnosis not present

## 2020-04-29 DIAGNOSIS — K219 Gastro-esophageal reflux disease without esophagitis: Secondary | ICD-10-CM | POA: Insufficient documentation

## 2020-04-29 DIAGNOSIS — D509 Iron deficiency anemia, unspecified: Secondary | ICD-10-CM | POA: Diagnosis not present

## 2020-04-29 HISTORY — PX: CYSTOCELE REPAIR: SHX163

## 2020-04-29 HISTORY — PX: VAGINAL HYSTERECTOMY: SHX2639

## 2020-04-29 LAB — ABO/RH: ABO/RH(D): B POS

## 2020-04-29 SURGERY — HYSTERECTOMY, VAGINAL
Anesthesia: General | Site: Vagina

## 2020-04-29 MED ORDER — CEFAZOLIN SODIUM-DEXTROSE 2-4 GM/100ML-% IV SOLN
INTRAVENOUS | Status: AC
  Filled 2020-04-29: qty 100

## 2020-04-29 MED ORDER — ACETAMINOPHEN 500 MG PO TABS: 1000.0000 mg | ORAL_TABLET | ORAL | Status: AC

## 2020-04-29 MED ORDER — GLYCOPYRROLATE 0.2 MG/ML IJ SOLN
INTRAMUSCULAR | Status: AC
  Filled 2020-04-29: qty 1

## 2020-04-29 MED ORDER — ESTROGENS, CONJUGATED 0.625 MG/GM VA CREA
TOPICAL_CREAM | VAGINAL | Status: AC
  Filled 2020-04-29: qty 30

## 2020-04-29 MED ORDER — MIDAZOLAM HCL 2 MG/2ML IJ SOLN
INTRAMUSCULAR | Status: DC | PRN
  Administered 2020-04-29: 1 mg via INTRAVENOUS

## 2020-04-29 MED ORDER — FENTANYL CITRATE (PF) 100 MCG/2ML IJ SOLN: 25.0000 ug | INTRAMUSCULAR | Status: DC | PRN

## 2020-04-29 MED ORDER — ONDANSETRON HCL 4 MG/2ML IJ SOLN
INTRAMUSCULAR | Status: DC | PRN
  Administered 2020-04-29: 4 mg via INTRAVENOUS

## 2020-04-29 MED ORDER — ONDANSETRON 4 MG PO TBDP: 4.0000 mg | ORAL_TABLET | Freq: Four times a day (QID) | ORAL | Status: DC | PRN

## 2020-04-29 MED ORDER — FENTANYL CITRATE (PF) 250 MCG/5ML IJ SOLN
INTRAMUSCULAR | Status: AC
  Filled 2020-04-29: qty 5

## 2020-04-29 MED ORDER — CEFAZOLIN SODIUM-DEXTROSE 2-4 GM/100ML-% IV SOLN
2.0000 g | Freq: Once | INTRAVENOUS | Status: AC
  Administered 2020-04-29: 2 g via INTRAVENOUS

## 2020-04-29 MED ORDER — KETOROLAC TROMETHAMINE 30 MG/ML IJ SOLN: 30.0000 mg | Freq: Once | INTRAMUSCULAR | Status: AC

## 2020-04-29 MED ORDER — PROPOFOL 10 MG/ML IV BOLUS
INTRAVENOUS | Status: DC | PRN
  Administered 2020-04-29: 90 mg via INTRAVENOUS

## 2020-04-29 MED ORDER — ROCURONIUM BROMIDE 100 MG/10ML IV SOLN
INTRAVENOUS | Status: DC | PRN
  Administered 2020-04-29: 50 mg via INTRAVENOUS

## 2020-04-29 MED ORDER — LIDOCAINE HCL (CARDIAC) PF 100 MG/5ML IV SOSY
PREFILLED_SYRINGE | INTRAVENOUS | Status: DC | PRN
  Administered 2020-04-29: 100 mg via INTRAVENOUS

## 2020-04-29 MED ORDER — GABAPENTIN 300 MG PO CAPS: 300.0000 mg | ORAL_CAPSULE | ORAL | Status: AC

## 2020-04-29 MED ORDER — ACETAMINOPHEN 500 MG PO TABS
ORAL_TABLET | ORAL | Status: AC
  Administered 2020-04-29: 1000 mg via ORAL
  Filled 2020-04-29: qty 2

## 2020-04-29 MED ORDER — ORAL CARE MOUTH RINSE: 15.0000 mL | Freq: Once | OROMUCOSAL | Status: AC

## 2020-04-29 MED ORDER — HYDROCODONE-ACETAMINOPHEN 5-325 MG PO TABS: 1.0000 | ORAL_TABLET | ORAL | Status: DC | PRN

## 2020-04-29 MED ORDER — SUGAMMADEX SODIUM 500 MG/5ML IV SOLN
INTRAVENOUS | Status: DC | PRN
  Administered 2020-04-29: 150 mg via INTRAVENOUS

## 2020-04-29 MED ORDER — MIDAZOLAM HCL 2 MG/2ML IJ SOLN
INTRAMUSCULAR | Status: AC
  Filled 2020-04-29: qty 2

## 2020-04-29 MED ORDER — ONDANSETRON HCL 4 MG/2ML IJ SOLN
INTRAMUSCULAR | Status: AC
  Filled 2020-04-29: qty 2

## 2020-04-29 MED ORDER — PROPOFOL 500 MG/50ML IV EMUL
INTRAVENOUS | Status: AC
  Filled 2020-04-29: qty 50

## 2020-04-29 MED ORDER — GLYCOPYRROLATE 0.2 MG/ML IJ SOLN
INTRAMUSCULAR | Status: DC | PRN
  Administered 2020-04-29 (×2): .1 mg via INTRAVENOUS

## 2020-04-29 MED ORDER — SILVER NITRATE-POT NITRATE 75-25 % EX MISC
CUTANEOUS | Status: AC
  Filled 2020-04-29: qty 10

## 2020-04-29 MED ORDER — LACTATED RINGERS IV SOLN: INTRAVENOUS | Status: DC

## 2020-04-29 MED ORDER — LIDOCAINE-EPINEPHRINE 1 %-1:100000 IJ SOLN
INTRAMUSCULAR | Status: DC | PRN
  Administered 2020-04-29: 15 mL

## 2020-04-29 MED ORDER — PHENYLEPHRINE HCL (PRESSORS) 10 MG/ML IV SOLN
INTRAVENOUS | Status: DC | PRN
  Administered 2020-04-29: 100 ug via INTRAVENOUS

## 2020-04-29 MED ORDER — GABAPENTIN 300 MG PO CAPS
ORAL_CAPSULE | ORAL | Status: AC
  Administered 2020-04-29: 300 mg via ORAL
  Filled 2020-04-29: qty 1

## 2020-04-29 MED ORDER — POVIDONE-IODINE 10 % EX SWAB: 2.0000 "application " | Freq: Once | CUTANEOUS | Status: DC

## 2020-04-29 MED ORDER — FENTANYL CITRATE (PF) 100 MCG/2ML IJ SOLN
INTRAMUSCULAR | Status: DC | PRN
  Administered 2020-04-29: 50 ug via INTRAVENOUS

## 2020-04-29 MED ORDER — CHLORHEXIDINE GLUCONATE 0.12 % MT SOLN: 15.0000 mL | Freq: Once | OROMUCOSAL | Status: AC

## 2020-04-29 MED ORDER — KETOROLAC TROMETHAMINE 30 MG/ML IJ SOLN
INTRAMUSCULAR | Status: AC
  Administered 2020-04-29: 30 mg via INTRAVENOUS
  Filled 2020-04-29: qty 1

## 2020-04-29 MED ORDER — CHLORHEXIDINE GLUCONATE 0.12 % MT SOLN
OROMUCOSAL | Status: AC
  Administered 2020-04-29: 15 mL via OROMUCOSAL
  Filled 2020-04-29: qty 15

## 2020-04-29 MED ORDER — LIDOCAINE-EPINEPHRINE 1 %-1:100000 IJ SOLN
INTRAMUSCULAR | Status: AC
  Filled 2020-04-29: qty 1

## 2020-04-29 MED ORDER — ONDANSETRON HCL 4 MG/2ML IJ SOLN: 4.0000 mg | Freq: Once | INTRAMUSCULAR | Status: DC | PRN

## 2020-04-29 SURGICAL SUPPLY — 46 items
BAG URINE DRAIN 2000ML AR STRL (UROLOGICAL SUPPLIES) ×4 IMPLANT
BLADE SURG 15 STRL LF DISP TIS (BLADE) ×2 IMPLANT
BLADE SURG 15 STRL SS (BLADE) ×2
CANISTER SUCT 1200ML W/VALVE (MISCELLANEOUS) ×4 IMPLANT
CATH FOLEY 2WAY  5CC 16FR (CATHETERS) ×2
CATH ROBINSON RED A/P 16FR (CATHETERS) ×4 IMPLANT
CATH URTH 16FR FL 2W BLN LF (CATHETERS) ×2 IMPLANT
COVER WAND RF STERILE (DRAPES) ×4 IMPLANT
DRAPE PERI LITHO V/GYN (MISCELLANEOUS) ×4 IMPLANT
DRAPE SURG 17X11 SM STRL (DRAPES) ×4 IMPLANT
DRAPE UNDER BUTTOCK W/FLU (DRAPES) ×4 IMPLANT
ELECT REM PT RETURN 9FT ADLT (ELECTROSURGICAL) ×4
ELECTRODE REM PT RTRN 9FT ADLT (ELECTROSURGICAL) ×2 IMPLANT
GAUZE 4X4 16PLY RFD (DISPOSABLE) ×4 IMPLANT
GAUZE PACK 2X3YD (GAUZE/BANDAGES/DRESSINGS) ×4 IMPLANT
GLOVE SURG SYN 8.0 (GLOVE) ×4 IMPLANT
GOWN STRL REUS W/ TWL LRG LVL3 (GOWN DISPOSABLE) ×6 IMPLANT
GOWN STRL REUS W/ TWL XL LVL3 (GOWN DISPOSABLE) ×2 IMPLANT
GOWN STRL REUS W/TWL LRG LVL3 (GOWN DISPOSABLE) ×6
GOWN STRL REUS W/TWL XL LVL3 (GOWN DISPOSABLE) ×2
KIT TURNOVER CYSTO (KITS) ×4 IMPLANT
KIT TURNOVER KIT A (KITS) ×4 IMPLANT
LABEL OR SOLS (LABEL) ×4 IMPLANT
MARKER SKIN DUAL TIP RULER LAB (MISCELLANEOUS) ×4 IMPLANT
NEEDLE HYPO 22GX1.5 SAFETY (NEEDLE) ×4 IMPLANT
NS IRRIG 500ML POUR BTL (IV SOLUTION) ×4 IMPLANT
PACK BASIN MINOR (MISCELLANEOUS) ×4 IMPLANT
PAD OB MATERNITY 4.3X12.25 (PERSONAL CARE ITEMS) ×4 IMPLANT
PAD PREP 24X41 OB/GYN DISP (PERSONAL CARE ITEMS) ×4 IMPLANT
SOL PREP PROV IODINE SCRUB 4OZ (MISCELLANEOUS) ×4 IMPLANT
SOL PREP PVP 2OZ (MISCELLANEOUS) ×4
SOLUTION PREP PVP 2OZ (MISCELLANEOUS) ×2 IMPLANT
SURGILUBE 2OZ TUBE FLIPTOP (MISCELLANEOUS) ×4 IMPLANT
SUT PDS 2-0 27IN (SUTURE) ×4 IMPLANT
SUT PDS AB 2-0 CT1 27 (SUTURE) ×4 IMPLANT
SUT VIC AB 0 CT1 27 (SUTURE) ×6
SUT VIC AB 0 CT1 27XCR 8 STRN (SUTURE) ×6 IMPLANT
SUT VIC AB 0 CT1 36 (SUTURE) ×8 IMPLANT
SUT VIC AB 2-0 CT1 36 (SUTURE) IMPLANT
SUT VIC AB 2-0 SH 27 (SUTURE) ×6
SUT VIC AB 2-0 SH 27XBRD (SUTURE) ×6 IMPLANT
SUT VIC AB 3-0 SH 27 (SUTURE)
SUT VIC AB 3-0 SH 27X BRD (SUTURE) IMPLANT
SYR 10ML LL (SYRINGE) ×4 IMPLANT
SYR CONTROL 10ML LL (SYRINGE) ×4 IMPLANT
WATER STERILE IRR 1000ML POUR (IV SOLUTION) ×4 IMPLANT

## 2020-04-29 NOTE — Op Note (Signed)
NAME: Alejandra Singh, Alejandra Singh MEDICAL RECORD [id number] ACCOUNT [id number] DATE OF BIRTH:January 23, 1941 FACILITY: [facility] LOCATION: [facility] PHYSICIAN:Amariah Kierstead Josefine Class, MD  OPERATIVE REPORT  DATE OF PROCEDURE:  04/29/2020  PREOPERATIVE DIAGNOSES: 1.  Uterine descensus. 2.  Grade III cystocele.  POSTOPERATIVE DIAGNOSES:   1.  Uterine descensus. 2.  Grade III cystocele.  PROCEDURE: 1.  Total vaginal hysterectomy. 2.  Anterior colporrhaphy.  SURGEON:  Laverta Baltimore, MD  FIRST ASSISTANT:  Benjaman Kindler, MD  INDICATIONS:  A 79 year old gravida 2, para 2 patient with grade II uterine descensus and grade III cystocele.  The patient failed conservative treatment in the office with pessary use and elects for definitive surgery.  DESCRIPTION OF PROCEDURE:  After adequate general endotracheal anesthesia, the patient was placed in the dorsal supine position with legs in the candy cane stirrups.  The patient's abdomen, perineum and vagina were prepped and draped in sterile fashion.   The patient did receive 2 grams IV Ancef prior to commencement of the case.  Timeout was performed.  Bladder was straight catheterized yielding 100 mL of urine.  Weighted speculum was placed in the posterior vaginal vault and the anterior and posterior  cervix were grasped with thyroid tenacula.  Cervix was circumferentially injected with 1% lidocaine with 1:100,000 epinephrine.  A direct posterior colpotomy incision was made.  Upon entry into the posterior cul-de-sac long-billed speculum was placed and  the uterosacral ligaments were bilaterally clamped, transected, suture ligated and tagged for later identification.  The anterior cervix was incised with the Bovie followed by clamping of the bilateral cardinal ligaments, transecting and suture ligating  with 0 Vicryl suture.  Anterior cul-de-sac was then entered sharply and Deaver was placed within to elevate the bladder anteriorly.  Uterine arteries were  then bilaterally clamped, transected, suture ligated with 0 Vicryl suture.  One additional bite on  each side incorporated the cornua bilaterally.  Uterus and cervix were delivered and each cornual pedicle was doubly ligated with 0 Vicryl suture.  Initial intention was to remove the fallopian tubes and ovaries bilaterally; however, both were atretic  and high and could not be brought into view.  Therefore, the bilateral salpingo-oophorectomy was not performed.  Good hemostasis was noted.  The peritoneum was then closed with a 2-0 PDS suture in a pursestring fashion.  Bladder was re-drained yielding  an additional 50 mL of urine.  The anterior vaginal epithelium was then placed on traction and the epithelium was then injected with 1% lidocaine with 1:100,000 epinephrine in a central position all the way from the previously opened vaginal cuff to 1 cm  inferior to the urethral meatus.  The bladder was then dissected off the epithelium and adequate endopelvic fascia was then used to close the defect with horizontal mattress 2-0 Vicryl sutures.  Four separate sutures were used to close this defect.  The  vaginal epithelium was then trimmed and the vaginal vault then was closed with a running 0 Vicryl suture.  Good approximation of edges.  Good repair and reduction of the cystocele.  The uterosacral ligaments were plicated centrally and the rest of the  vault was closed with the 0 Vicryl suture.  COMPLICATIONS:  There were no complications.  ESTIMATED BLOOD LOSS:  20 mL.  INTRAOPERATIVE FLUIDS:  800 mL.  URINE OUTPUT:  150 mL.  DISPOSITION:  The patient was taken to recovery room in good condition.  CN/NUANCE  D:04/29/2020 T:04/29/2020 JOB:[id number]

## 2020-04-29 NOTE — Anesthesia Preprocedure Evaluation (Signed)
Anesthesia Evaluation  Patient identified by MRN, date of birth, ID band Patient awake    Reviewed: Allergy & Precautions, H&P , NPO status , Patient's Chart, lab work & pertinent test results, reviewed documented beta blocker date and time   Airway Mallampati: II   Neck ROM: full    Dental  (+) Poor Dentition, Teeth Intact   Pulmonary neg pulmonary ROS,    Pulmonary exam normal        Cardiovascular hypertension, + CAD  Normal cardiovascular exam+ dysrhythmias  Rhythm:regular Rate:Normal     Neuro/Psych negative neurological ROS  negative psych ROS   GI/Hepatic negative GI ROS, Neg liver ROS, GERD  Medicated,  Endo/Other  negative endocrine ROSHypothyroidism   Renal/GU negative Renal ROS  Female GU complaint     Musculoskeletal   Abdominal   Peds negative pediatric ROS (+)  Hematology negative hematology ROS (+) anemia ,   Anesthesia Other Findings Past Medical History:   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   Hyperlipidemia                                               Iron deficiency anemia                                       FH: colonic polyps                                           GERD (gastroesophageal reflux disease)                       Osteoporosis                                                 Hypothyroidism                                             Past Surgical History:   TONSILLECTOMY                                                 broken ankle                                                  DILATION AND CURETTAGE OF UTERUS                              COLONOSCOPY WITH PROPOFOL  N/A 07/05/2015      Comment:Procedure: COLONOSCOPY WITH PROPOFOL;  Surgeon:              Manya Silvas, MD;  Location: [facility]               ENDOSCOPY;  Service: Endoscopy;  Laterality:               N/A; BMI    Body Mass Index    25.90 kg/m 2     Reproductive/Obstetrics                             Anesthesia Physical  Anesthesia Plan  ASA: III  Anesthesia Plan: General   Post-op Pain Management:    Induction: Intravenous  PONV Risk Score and Plan:   Airway Management Planned: Oral ETT  Additional Equipment:   Intra-op Plan:   Post-operative Plan: Extubation in OR  Informed Consent: I have reviewed the patients History and Physical, chart, labs and discussed the procedure including the risks, benefits and alternatives for the proposed anesthesia with the patient or authorized representative who has indicated his/her understanding and acceptance.     Dental Advisory Given  Plan Discussed with: CRNA  Anesthesia Plan Comments:         Anesthesia Quick Evaluation

## 2020-04-29 NOTE — Brief Op Note (Signed)
04/29/2020  10:50 AM  PATIENT:  Alejandra Singh  79 y.o. female  PRE-OPERATIVE DIAGNOSIS:  uterine descensus, anterior cystocele  POST-OPERATIVE DIAGNOSIS:  uterine descensus, anterior cystocele  PROCEDURE:  Procedure(s): HYSTERECTOMY VAGINAL (N/A) ANTERIOR REPAIR (CYSTOCELE) (N/A) Anterior colporrhaphy SURGEON:  Surgeon(s) and Role:    * Martisha Toulouse, Gwen Her, MD - Primary    * Benjaman Kindler, MD - Assisting  PHYSICIAN ASSISTANT: CST  ASSISTANTS: none   ANESTHESIA:   general  EBL:  20 mL  IOF 800 cc uo 150 cc  BLOOD ADMINISTERED:none  DRAINS: none   LOCAL MEDICATIONS USED:  LIDOCAINE  and OTHER with epi   SPECIMEN:  Source of Specimen:  cervix, uterus   DISPOSITION OF SPECIMEN:  PATHOLOGY  COUNTS:  YES  TOURNIQUET:  * No tourniquets in log *  DICTATION: .Other Dictation: Dictation Number verbal  PLAN OF CARE: Discharge to home after PACU  PATIENT DISPOSITION:  PACU - hemodynamically stable.   Delay start of Pharmacological VTE agent (>24hrs) due to surgical blood loss or risk of bleeding: not applicable

## 2020-04-29 NOTE — Discharge Instructions (Signed)

## 2020-04-29 NOTE — Progress Notes (Signed)
Pt here for TVH / BSO and anterior repair .  LAbs reviewed   All questions answered . Proceed

## 2020-04-29 NOTE — Transfer of Care (Signed)
Immediate Anesthesia Transfer of Care Note  Patient: Alejandra Singh  Procedure(s) Performed: HYSTERECTOMY VAGINAL (N/A Vagina ) ANTERIOR REPAIR (CYSTOCELE) (N/A Vagina )  Patient Location: PACU  Anesthesia Type:General  Level of Consciousness: awake, alert  and oriented  Airway & Oxygen Therapy: Patient Spontanous Breathing and Patient connected to nasal cannula oxygen  Post-op Assessment: Report given to RN and Post -op Vital signs reviewed and stable  Post vital signs: Reviewed and stable  Last Vitals:  Vitals Value Taken Time  BP 102/47 04/29/20 1106  Temp 36.1 C 04/29/20 1103  Pulse 56 04/29/20 1108  Resp 22 04/29/20 1108  SpO2 98 % 04/29/20 1108  Vitals shown include unvalidated device data.  Last Pain:  Vitals:   04/29/20 1103  TempSrc:   PainSc: Asleep         Complications: No complications documented.

## 2020-04-30 ENCOUNTER — Encounter: Payer: Self-pay | Admitting: Obstetrics and Gynecology

## 2020-04-30 NOTE — Anesthesia Postprocedure Evaluation (Signed)
Anesthesia Post Note  Patient: Alejandra Singh  Procedure(s) Performed: HYSTERECTOMY VAGINAL (N/A Vagina ) ANTERIOR REPAIR (CYSTOCELE) (N/A Vagina )  Patient location during evaluation: PACU Anesthesia Type: General Level of consciousness: awake and alert and oriented Pain management: pain level controlled Vital Signs Assessment: post-procedure vital signs reviewed and stable Respiratory status: spontaneous breathing Cardiovascular status: blood pressure returned to baseline Anesthetic complications: no   No complications documented.   Last Vitals:  Vitals:   04/29/20 1302 04/29/20 1354  BP: (!) 131/54 (!) 132/55  Pulse:  62  Resp:    Temp:    SpO2:  96%    Last Pain:  Vitals:   04/29/20 1221  TempSrc: Temporal  PainSc: 3                  Aaryana Betke

## 2020-05-02 LAB — SURGICAL PATHOLOGY

## 2020-05-16 ENCOUNTER — Other Ambulatory Visit: Payer: Self-pay | Admitting: Cardiology

## 2020-06-05 ENCOUNTER — Other Ambulatory Visit: Payer: Self-pay | Admitting: Family Medicine

## 2020-06-05 ENCOUNTER — Other Ambulatory Visit: Payer: Self-pay | Admitting: Obstetrics and Gynecology

## 2020-06-05 DIAGNOSIS — Z1231 Encounter for screening mammogram for malignant neoplasm of breast: Secondary | ICD-10-CM

## 2020-06-21 ENCOUNTER — Other Ambulatory Visit: Payer: Self-pay

## 2020-06-21 ENCOUNTER — Ambulatory Visit
Admission: RE | Admit: 2020-06-21 | Discharge: 2020-06-21 | Disposition: A | Discharge status: Home / Self Care | Payer: Medicare Other | Source: Ambulatory Visit | Attending: Obstetrics and Gynecology | Admitting: Obstetrics and Gynecology

## 2020-06-21 DIAGNOSIS — Z1231 Encounter for screening mammogram for malignant neoplasm of breast: Secondary | ICD-10-CM | POA: Diagnosis not present

## 2020-09-11 ENCOUNTER — Other Ambulatory Visit: Payer: Self-pay | Admitting: Cardiology

## 2020-09-30 ENCOUNTER — Encounter: Payer: Self-pay | Admitting: Physical Therapy

## 2020-09-30 ENCOUNTER — Other Ambulatory Visit: Payer: Self-pay

## 2020-09-30 ENCOUNTER — Ambulatory Visit: Payer: Medicare Other | Attending: Family Medicine | Admitting: Physical Therapy

## 2020-09-30 DIAGNOSIS — M545 Low back pain, unspecified: Secondary | ICD-10-CM | POA: Insufficient documentation

## 2020-09-30 DIAGNOSIS — R269 Unspecified abnormalities of gait and mobility: Secondary | ICD-10-CM | POA: Diagnosis present

## 2020-09-30 DIAGNOSIS — M6281 Muscle weakness (generalized): Secondary | ICD-10-CM | POA: Insufficient documentation

## 2020-09-30 DIAGNOSIS — R2689 Other abnormalities of gait and mobility: Secondary | ICD-10-CM | POA: Diagnosis present

## 2020-09-30 NOTE — Therapy (Signed)
[facility] [address] [place], [place], [address] Phone: [phone]   Fax:  [phone]  Physical Therapy Evaluation  Patient Details  Name: Alejandra Singh MRN: [id number] Date of Birth: [birth year] Referring Provider (PT): Ranae Plumber, [place]   Encounter Date: 09/30/2020   PT End of Session - 09/30/20 1728    Visit Number 1    Number of Visits 9    Date for PT Re-Evaluation 10/28/20    Authorization - Visit Number 1    Authorization - Number of Visits 10    PT Start Time 0849    PT Stop Time 0947    PT Time Calculation (min) 58 min    Equipment Utilized During Treatment Gait belt    Activity Tolerance Patient tolerated treatment well    Behavior During Therapy [facility] for tasks assessed/performed           Past Medical History:  Diagnosis Date  . Arthritis   . Essential hypertension   . FH: colonic polyps   . Frequent PVCs    RV outflow tract  . GERD (gastroesophageal reflux disease)   . Hyperlipidemia   . Hypothyroidism   . Iron deficiency anemia   . Non-obstructive CAD (coronary artery disease)    a. 08/2015 ETT: Ex time 6:18. No ST/T changes;  b. 09/2016 Cath: LM nl, LAD 50d, small bridging, LCX nl, RCA nl, EF 55-65%-->Med Rx.  . Osteoporosis   . PSVT (paroxysmal supraventricular tachycardia) ([place])    a. 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 ANKLE SURGERY Left    Fracture  . CARDIAC CATHETERIZATION N/A 09/22/2016   Procedure: Left Heart Cath and Coronary Angiography;  Surgeon: Leonie Man, MD;  Location: [facility];  Service: Cardiovascular;  Laterality: N/A;  . COLONOSCOPY WITH PROPOFOL N/A 07/05/2015   Procedure: COLONOSCOPY WITH PROPOFOL;  Surgeon: Manya Silvas, MD;  Location: [facility] ENDOSCOPY;  Service: Endoscopy;  Laterality: N/A;  . CYSTOCELE REPAIR N/A 04/29/2020   Procedure: ANTERIOR REPAIR (CYSTOCELE);  Surgeon: Schermerhorn, Gwen Her, MD;   Location: [facility] ORS;  Service: Gynecology;  Laterality: N/A;  . DILATION AND CURETTAGE OF UTERUS    . ESOPHAGOGASTRODUODENOSCOPY (EGD) WITH PROPOFOL N/A 03/19/2016   Procedure: ESOPHAGOGASTRODUODENOSCOPY (EGD) WITH PROPOFOL;  Surgeon: Manya Silvas, MD;  Location: [facility] ENDOSCOPY;  Service: Endoscopy;  Laterality: N/A;  . EYE SURGERY Right    cataract  . TONSILLECTOMY    . VAGINAL HYSTERECTOMY N/A 04/29/2020   Procedure: HYSTERECTOMY VAGINAL;  Surgeon: Schermerhorn, Gwen Her, MD;  Location: [facility] ORS;  Service: Gynecology;  Laterality: N/A;    There were no vitals filed for this visit.    Subjective Assessment - 09/30/20 1725    Subjective Pt. states she had a fall in November while walking down steps at home into basement.  Pt. states she felt herself falling forward and corrected resulting in a backwards fall on buttocks.  Pt. reports continued soreness in R low back/ SI region.    Limitations Lifting;Standing;Walking;House hold activities    Diagnostic tests (-) low back X-ray    Patient Stated Goals Improve balance/ gait.  Decrease R low back pain.    Currently in Pain? Yes    Pain Score 2     Pain Location Back    Pain Orientation Right;Lower    Pain Descriptors / Indicators Aching  [facility] PT Assessment - 09/30/20 0001      Assessment   Medical Diagnosis Balance Problem    Referring Provider (PT) Ranae Plumber, PA-C    Onset Date/Surgical Date 08/19/20    Prior Therapy No      Precautions   Precautions Fall      Balance Screen   Has the patient fallen in the past 6 months Yes    How many times? 2      [place] residence      Prior Function   Level of Independence Independent      Cognition   Overall Cognitive Status Within Functional Limits for tasks assessed            See flowsheet  Decrease ankle AROM/ strength.  Pt. Reports decrease sensation in B lower legs/ feet.      Objective measurements  completed on examination: See above findings.    Instructed in use of SPC on L with 2-point gait pattern.      PT Education - 09/30/20 1727    Education Details Issued HEP (lumbar rotn./ knee to chest/ bridging/ seated ankle AROM/ tandem stance)    Person(s) Educated Patient    Methods Explanation;Demonstration;Handout    Comprehension Verbalized understanding;Returned demonstration               PT Long Term Goals - 09/30/20 1745      PT LONG TERM GOAL #1   Title Pt. will increase FOTO to 73 to improve pain-free functional mobility.    Baseline Initial FOTO: 60    Time 4    Period Weeks    Status New    Target Date 10/28/20      PT LONG TERM GOAL #2   Title Pt. will be independent with HEP to increase B hip/ ankle strength 1 full muscle grade to improve gait/ balance.    Baseline B LE muscle strength grossly 5/5 MMT except hip flexion 4/5 MMT/ hip abduction 4/5 MMT/ ankle DF and PF 2/5 MMT.    Time 4    Period Weeks    Status New    Target Date 10/28/20      PT LONG TERM GOAL #3   Title Pt. will increase Berg balance test to >50/56 to improve safety/ independence with walking.    Baseline Berg: 47/56    Time 4    Period Weeks    Status New    Target Date 10/28/20      PT LONG TERM GOAL #4   Title Pt. will ambulate with least restrictive device community distances with no loss of balance.    Baseline Pt. currently ambulates with no assistive device and ataxic gait.  Pt. will benefit from [facility] currently.    Time 4    Period Weeks    Status New    Target Date 10/28/20                  Plan - 09/30/20 1729    Clinical Impression Statement Pt. is a pleasant 79 y/o female with reports of a fall backwards on stairs resulting in R low back pain (<1 month ago).  Pt. states she has had a couple falls in the past 6 months and states she has noticed increase difficulty walking.  Pt. reports 1-2/10 R low back/ SI pain with standing/ position changes.  Lumbar and LE  AROM WFL except ankle DF/PF limited.  Pt. has increase R low  back pain with supine L rotn./ briding.  B LE muscle strength grossly 5/5 MMT except hip flexion 4/5 MMT/ hip abduction 4/5 MMT/ ankle DF and PF 2/5 MMT.  Slight forward posture with R knee valgus noted/ L knee varus (previous meniscus injury) and LLD noted.  L LE is 2.25 cm longer than R (correct with internal heel lift).  Pt. has difficulty standing without UE assist from low/ soft surfaces.  R ataxic gait pattern secondary to LLD/ knee variations and able to correct with use of SPC on L.  Berg balance test: 47/56.  FOTO: 60/73.  Pt. will benefit from skilled PT services to increase LE muscle strength to improve balance/ pain-free mobility/ safety with walking.    Stability/Clinical Decision Making Evolving/Moderate complexity    Clinical Decision Making Moderate    Rehab Potential Good    PT Frequency 2x / week    PT Duration 4 weeks    PT Treatment/Interventions ADLs/Self Care Home Management;Cryotherapy;Electrical Stimulation;Moist Heat;Functional mobility training;Stair training;Gait training;DME Instruction;Therapeutic activities;Therapeutic exercise;Balance training;Neuromuscular re-education;Manual techniques;Patient/family education;Passive range of motion    PT Next Visit Plan Dynamic balance activites/ reassess R SI pain and heel lift    PT Home Exercise Plan Med[employer]           Patient will benefit from skilled therapeutic intervention in order to improve the following deficits and impairments:  Abnormal gait,Improper body mechanics,Pain,Postural dysfunction,Decreased mobility,Decreased activity tolerance,Decreased endurance,Decreased range of motion,Decreased strength,Impaired flexibility,Difficulty walking,Decreased safety awareness,Decreased balance  Visit Diagnosis: Balance problem  Muscle weakness (generalized)  Gait difficulty  Acute right-sided low back pain without sciatica     Problem List Patient Active  Problem List   Diagnosis Date Noted  . 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 Chest discomfort 03/26/2018  . Bradycardia with 31-40 beats per minute 09/21/2016  . 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 Hypothyroidism   . Gastroesophageal reflux disease   . Coronary artery calcification seen on CAT scan 03/22/2016  . Essential hypertension, benign 08/28/2010  . Paroxysmal supraventricular tachycardia ([place]) 08/28/2010  . Mixed hyperlipidemia 05/14/2009   Pura Spice, PT, DPT # [phone] 09/30/2020, 5:50 PM  [facility] [address] [place], [place], [address] Phone: [phone]   Fax:  [phone]  Name: Alejandra Singh MRN: [id number] Date of Birth: [birth year]

## 2020-10-02 ENCOUNTER — Encounter: Payer: Self-pay | Admitting: Physical Therapy

## 2020-10-02 ENCOUNTER — Other Ambulatory Visit: Payer: Self-pay

## 2020-10-02 ENCOUNTER — Ambulatory Visit: Payer: Medicare Other | Admitting: Physical Therapy

## 2020-10-02 DIAGNOSIS — M545 Low back pain, unspecified: Secondary | ICD-10-CM

## 2020-10-02 DIAGNOSIS — M6281 Muscle weakness (generalized): Secondary | ICD-10-CM

## 2020-10-02 DIAGNOSIS — R2689 Other abnormalities of gait and mobility: Secondary | ICD-10-CM

## 2020-10-02 DIAGNOSIS — R269 Unspecified abnormalities of gait and mobility: Secondary | ICD-10-CM

## 2020-10-02 NOTE — Therapy (Signed)
[place] [facility] [address] [place], [place], [address] Phone: [phone]   Fax:  [phone]  Physical Therapy Treatment  Patient Details  Name: Alejandra Singh MRN: [id number] Date of Birth: 28-Jan-1941 Referring Provider (PT): Ranae Plumber, [place]   Encounter Date: 10/02/2020   PT End of Session - 10/02/20 0858    Visit Number 2    Number of Visits 9    Date for PT Re-Evaluation 10/28/20    Authorization - Visit Number 2    Authorization - Number of Visits 10    PT Start Time 0858    PT Stop Time 0948    PT Time Calculation (min) 50 min    Equipment Utilized During Treatment Gait belt    Activity Tolerance Patient tolerated treatment well    Behavior During Therapy [facility] for tasks assessed/performed           Past Medical History:  Diagnosis Date  . Arthritis   . Essential hypertension   . FH: colonic polyps   . Frequent PVCs    RV outflow tract  . GERD (gastroesophageal reflux disease)   . Hyperlipidemia   . Hypothyroidism   . Iron deficiency anemia   . Non-obstructive CAD (coronary artery disease)    a. 08/2015 ETT: Ex time 6:18. No ST/T changes;  b. 09/2016 Cath: LM nl, LAD 50d, small bridging, LCX nl, RCA nl, EF 55-65%-->Med Rx.  . Osteoporosis   . 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 ANKLE SURGERY Left    Fracture  . CARDIAC CATHETERIZATION N/A 09/22/2016   Procedure: Left Heart Cath and Coronary Angiography;  Surgeon: Leonie Man, MD;  Location: [place] CV LAB;  Service: Cardiovascular;  Laterality: N/A;  . COLONOSCOPY WITH PROPOFOL N/A 07/05/2015   Procedure: COLONOSCOPY WITH PROPOFOL;  Surgeon: Manya Silvas, MD;  Location: [facility] ENDOSCOPY;  Service: Endoscopy;  Laterality: N/A;  . CYSTOCELE REPAIR N/A 04/29/2020   Procedure: ANTERIOR REPAIR (CYSTOCELE);  Surgeon: Schermerhorn, Gwen Her, MD;   Location: [facility] ORS;  Service: Gynecology;  Laterality: N/A;  . DILATION AND CURETTAGE OF UTERUS    . ESOPHAGOGASTRODUODENOSCOPY (EGD) WITH PROPOFOL N/A 03/19/2016   Procedure: ESOPHAGOGASTRODUODENOSCOPY (EGD) WITH PROPOFOL;  Surgeon: Manya Silvas, MD;  Location: [facility] END[facility]COPY;  Service: Endoscopy;  Laterality: N/A;  . EYE SURGERY Right    cataract  . TONSILLECTOMY    . VAGINAL HYSTERECTOMY N/A 04/29/2020   Procedure: HYSTERECTOMY VAGINAL;  Surgeon: Schermerhorn, Gwen Her, MD;  Location: [facility] ORS;  Service: Gynecology;  Laterality: N/A;    There were no vitals filed for this visit.   Subjective Assessment - 10/02/20 0945    Subjective Pt. states she had benefited from heel lift and is compliant with HEP.  Pt. is planning on getting ankle wts.  Pt. using SPC with outside walking.  No c/o back or SI pain at this time.    Limitations Lifting;Standing;Walking;House hold activities    Diagnostic tests (-) low back X-ray    Patient Stated Goals Improve balance/ gait.  Decrease R low back pain.    Currently in Pain? No/denies             There.ex.:  Reviewed HEP  4# seated ex.: LAQ/ marching/ heel raises 20x.  Walking in //-bars forward/ backwards/ lateral 5x each.  Nustep L3 10 min. B UE/LE  Neuro.:  Walking cone  taps in //-bars with mirror feedback 2x.  Walking with recip. Step pattern over green hurdles with cuing to prevent R hip circumduction 5x.    Walking in hallway with/without use of SPC working on sequencing/ upright posture.        PT Long Term Goals - 09/30/20 1745      PT LONG TERM GOAL #1   Title Pt. will increase FOTO to 73 to improve pain-free functional mobility.    Baseline Initial FOTO: 60    Time 4    Period Weeks    Status New    Target Date 10/28/20      PT LONG TERM GOAL #2   Title Pt. will be independent with HEP to increase B hip/ ankle strength 1 full muscle grade to improve gait/ balance.    Baseline B LE muscle strength grossly 5/5 MMT  except hip flexion 4/5 MMT/ hip abduction 4/5 MMT/ ankle DF and PF 2/5 MMT.    Time 4    Period Weeks    Status New    Target Date 10/28/20      PT LONG TERM GOAL #3   Title Pt. will increase Berg balance test to >50/56 to improve safety/ independence with walking.    Baseline Berg: 47/56    Time 4    Period Weeks    Status New    Target Date 10/28/20      PT LONG TERM GOAL #4   Title Pt. will ambulate with least restrictive device community distances with no loss of balance.    Baseline Pt. currently ambulates with no assistive device and ataxic gait.  Pt. will benefit from [facility] currently.    Time 4    Period Weeks    Status New    Target Date 10/28/20                 Plan - 10/02/20 0954    Clinical Impression Statement Pt. had several episodes of staggered gait while walking in hallway without use of SPC but able to self-correct.  Pt. benefits from use of SPC and SBA for verbal cuing to correct BOS/ upright posture/ step pattern.  Pt. able to complete walking cone taps/ hurdles with occasional cuing to decrease hip circumduction.  Good LE muscle endurance with resisted ther.ex./ Nustep.  No change to HEP and pt. will focus on increase walking with SPC and get ankle wts. this weekend.    Stability/Clinical Decision Making Evolving/Moderate complexity    Clinical Decision Making Moderate    Rehab Potential Good    PT Frequency 2x / week    PT Duration 4 weeks    PT Treatment/Interventions ADLs/Self Care Home Management;Cryotherapy;Electrical Stimulation;Moist Heat;Functional mobility training;Stair training;Gait training;DME Instruction;Therapeutic activities;Therapeutic exercise;Balance training;Neuromuscular re-education;Manual techniques;Patient/family education;Passive range of motion    PT Next Visit Plan Dynamic balance activites/ reassess R SI pain and heel lift    PT Home Exercise Plan MedBridge           Patient will benefit from skilled therapeutic intervention  in order to improve the following deficits and impairments:  Abnormal gait,Improper body mechanics,Pain,Postural dysfunction,Decreased mobility,Decreased activity tolerance,Decreased endurance,Decreased range of motion,Decreased strength,Impaired flexibility,Difficulty walking,Decreased safety awareness,Decreased balance  Visit Diagnosis: Balance problem  Muscle weakness (generalized)  Gait difficulty  Acute right-sided low back pain without sciatica     Problem List Patient Active Problem List   Diagnosis Date Noted  . 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 Chest discomfort 03/26/2018  . Bradycardia with 31-40  beats per minute 09/21/2016  . 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 Hypothyroidism   . Gastroesophageal reflux disease   . Coronary artery calcification seen on CAT scan 03/22/2016  . Essential hypertension, benign 08/28/2010  . Paroxysmal supraventricular tachycardia ([place]) 08/28/2010  . Mixed hyperlipidemia 05/14/2009   Pura Spice, PT, DPT # [id number] 10/02/2020, 10:01 AM  [place] [facility] [address] [place], [place], [address] Phone: [phone]   Fax:  [phone]  Name: Alejandra Singh MRN: [id number] Date of Birth: Sep 10, 1941

## 2020-10-07 ENCOUNTER — Ambulatory Visit: Payer: Medicare Other | Admitting: Physical Therapy

## 2020-10-07 ENCOUNTER — Encounter: Payer: Self-pay | Admitting: Physical Therapy

## 2020-10-07 ENCOUNTER — Other Ambulatory Visit: Payer: Self-pay

## 2020-10-07 DIAGNOSIS — M6281 Muscle weakness (generalized): Secondary | ICD-10-CM

## 2020-10-07 DIAGNOSIS — M545 Low back pain, unspecified: Secondary | ICD-10-CM

## 2020-10-07 DIAGNOSIS — R269 Unspecified abnormalities of gait and mobility: Secondary | ICD-10-CM

## 2020-10-07 DIAGNOSIS — R2689 Other abnormalities of gait and mobility: Secondary | ICD-10-CM

## 2020-10-07 NOTE — Therapy (Signed)
[place] [facility] [address] [place], [place], [address] Phone: [phone]   Fax:  [phone]  Physical Therapy Treatment  Patient Details  Name: Alejandra Singh MRN: [id number] Date of Birth: 1941-04-29 Referring Provider (PT): Ranae Plumber, [place]   Encounter Date: 10/07/2020   PT End of Session - 10/07/20 0909    Visit Number 3    Number of Visits 9    Date for PT Re-Evaluation 10/28/20    Authorization - Visit Number 3    Authorization - Number of Visits 10    PT Start Time 2119    PT Stop Time 0949    PT Time Calculation (min) 50 min    Equipment Utilized During Treatment Gait belt    Activity Tolerance Patient tolerated treatment well    Behavior During Therapy [facility] for tasks assessed/performed           Past Medical History:  Diagnosis Date   Arthritis    Essential hypertension    FH: colonic polyps    Frequent PVCs    RV outflow tract   GERD (gastroesophageal reflux disease)    Hyperlipidemia    Hypothyroidism    Iron deficiency anemia    Non-obstructive CAD (coronary artery disease)    a. 08/2015 ETT: Ex time 6:18. No ST/T changes;  b. 09/2016 Cath: LM nl, LAD 50d, small bridging, LCX nl, RCA nl, EF 55-65%-->Med Rx.   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 ENDOSCOPY;  Service: Endoscopy;  Laterality: N/A;   CYSTOCELE REPAIR N/A 04/29/2020   Procedure: ANTERIOR REPAIR (CYSTOCELE);  Surgeon: Schermerhorn, Gwen Her, MD;   Location: [facility] ORS;  Service: Gynecology;  Laterality: N/A;   DILATION AND CURETTAGE OF UTERUS     ESOPHAGOGASTRODUODENOSCOPY (EGD) WITH PROPOFOL N/A 03/19/2016   Procedure: ESOPHAGOGASTRODUODENOSCOPY (EGD) WITH PROPOFOL;  Surgeon: Manya Silvas, MD;  Location: [facility] ENDOSCOPY;  Service: Endoscopy;  Laterality: N/A;   EYE SURGERY Right    cataract   TONSILLECTOMY     VAGINAL HYSTERECTOMY N/A 04/29/2020   Procedure: HYSTERECTOMY VAGINAL;  Surgeon: Schermerhorn, Gwen Her, MD;  Location: [facility] ORS;  Service: Gynecology;  Laterality: N/A;    There were no vitals filed for this visit.   Subjective Assessment - 10/07/20 0906    Subjective No reports of falls or loss of balance this past weekend.  Pt. states she had an episode of R low back/SI pain this weekend, that resolved by the next morning.  Pt. states she may have overexerted during her ex. program.  Pt. did not bring SPC today.    Limitations Lifting;Standing;Walking;House hold activities    Diagnostic tests (-) low back X-ray    Patient Stated Goals Improve balance/ gait.  Decrease R low back pain.    Currently in Pain? No/denies            There.ex.:  Nustep L4 10 min. B UE/LE  4# seated ex.: LAQ/ marching/ heel raises 20x.  Walking in //-bars forward/ backwards/ lateral  5x each.  Walking partial lunges in //-bars 4x.  Mirror feedback for posture  Standing heel/ toe raises (significant limitations/ compensates with UE assist on //-bars)   Neuro.:  Walking cone taps in //-bars with mirror feedback 2x.  Walking with alt. UE and LE touches in //-bars with min. To no UE assist.  Mirror feedback/ cuing for proper technique.      Walking in hallway with/without use of SPC working on sequencing/ upright posture.  Corrected reicp. Pattern with use of SPC on L.    Airex: lateral walking in //-bars with mirror feedback (light to no UE assist).  No increase c/o back discomfort.  Resisted gait: 1BTB 8x all 4-planes in  //-bars.  Focus on heel strike (forward) and toe strike (backwards).        PT Long Term Goals - 09/30/20 1745      PT LONG TERM GOAL #1   Title Pt. will increase FOTO to 73 to improve pain-free functional mobility.    Baseline Initial FOTO: 60    Time 4    Period Weeks    Status New    Target Date 10/28/20      PT LONG TERM GOAL #2   Title Pt. will be independent with HEP to increase B hip/ ankle strength 1 full muscle grade to improve gait/ balance.    Baseline B LE muscle strength grossly 5/5 MMT except hip flexion 4/5 MMT/ hip abduction 4/5 MMT/ ankle DF and PF 2/5 MMT.    Time 4    Period Weeks    Status New    Target Date 10/28/20      PT LONG TERM GOAL #3   Title Pt. will increase Berg balance test to >50/56 to improve safety/ independence with walking.    Baseline Berg: 47/56    Time 4    Period Weeks    Status New    Target Date 10/28/20      PT LONG TERM GOAL #4   Title Pt. will ambulate with least restrictive device community distances with no loss of balance.    Baseline Pt. currently ambulates with no assistive device and ataxic gait.  Pt. will benefit from [facility] currently.    Time 4    Period Weeks    Status New    Target Date 10/28/20                 Plan - 10/07/20 0910    Clinical Impression Statement Moderate R knee valgus and ankle weakness limits dynamic balance/ gait outside the //-bars.  Pt. benefits from use of 1 hand on handrial or SPC with recip. gait pattern.  Pt. was using SPC improperly and PT corrected with recip. 2-point gait pattern.  Cuing to increase posture/ head position during all standing ther.ex./ walking.  No LOB during tx. session but extra time with cone taps/ alt. LE touches.  No change to HEP at this time.    Stability/Clinical Decision Making Evolving/Moderate complexity    Clinical Decision Making Moderate    Rehab Potential Good    PT Frequency 2x / week    PT Duration 4 weeks    PT Treatment/Interventions ADLs/Self  Care Home Management;Cryotherapy;Electrical Stimulation;Moist Heat;Functional mobility training;Stair training;Gait training;DME Instruction;Therapeutic activities;Therapeutic exercise;Balance training;Neuromuscular re-education;Manual techniques;Patient/family education;Passive range of motion    PT Next Visit Plan Dynamic balance activites/ progress LE strengthening ex. program.    [place]  Patient will benefit from skilled therapeutic intervention in order to improve the following deficits and impairments:  Abnormal gait,Improper body mechanics,Pain,Postural dysfunction,Decreased mobility,Decreased activity tolerance,Decreased endurance,Decreased range of motion,Decreased strength,Impaired flexibility,Difficulty walking,Decreased safety awareness,Decreased balance  Visit Diagnosis: Balance problem  Muscle weakness (generalized)  Gait difficulty  Acute right-sided low back pain without sciatica     Problem List Patient Active Problem List   Diagnosis Date Noted   Frequent PVCs 03/28/2018   Chest discomfort 03/26/2018   Bradycardia with 31-40 beats per minute 09/21/2016   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 [place], [place], [address] Phone: [phone]   Fax:  [phone]  Name: Alejandra Singh MRN: [id number] Date of Birth: [birth year]

## 2020-10-09 ENCOUNTER — Ambulatory Visit: Payer: Medicare Other | Admitting: Physical Therapy

## 2020-10-14 ENCOUNTER — Other Ambulatory Visit: Payer: Self-pay

## 2020-10-14 ENCOUNTER — Ambulatory Visit: Payer: Medicare Other | Admitting: Physical Therapy

## 2020-10-14 ENCOUNTER — Encounter: Payer: Self-pay | Admitting: Physical Therapy

## 2020-10-14 DIAGNOSIS — R269 Unspecified abnormalities of gait and mobility: Secondary | ICD-10-CM

## 2020-10-14 DIAGNOSIS — R2689 Other abnormalities of gait and mobility: Secondary | ICD-10-CM

## 2020-10-14 DIAGNOSIS — M6281 Muscle weakness (generalized): Secondary | ICD-10-CM

## 2020-10-14 DIAGNOSIS — M545 Low back pain, unspecified: Secondary | ICD-10-CM

## 2020-10-14 NOTE — Therapy (Signed)
[place] [facility] [address] [place], [place], [address] Phone: [phone]   Fax:  [phone]  Physical Therapy Treatment  Patient Details  Name: Alejandra Singh MRN: [id number] Date of Birth: 03/23/1941 Referring Provider (PT): Shane Crutch, [place]   Encounter Date: 10/14/2020   PT End of Session - 10/14/20 0901    Visit Number 4    Number of Visits 9    Date for PT Re-Evaluation 10/28/20    Authorization - Visit Number 4    Authorization - Number of Visits 10    PT Start Time 0853    PT Stop Time 0948    PT Time Calculation (min) 55 min    Equipment Utilized During Treatment Gait belt    Activity Tolerance Patient tolerated treatment well    Behavior During Therapy [facility] for tasks assessed/performed           Past Medical History:  Diagnosis Date   Arthritis    Essential hypertension    FH: colonic polyps    Frequent PVCs    RV outflow tract   GERD (gastroesophageal reflux disease)    Hyperlipidemia    Hypothyroidism    Iron deficiency anemia    Non-obstructive CAD (coronary artery disease)    a. 08/2015 ETT: Ex time 6:18. No ST/T changes;  b. 09/2016 Cath: LM nl, LAD 50d, small bridging, LCX nl, RCA nl, EF 55-65%-->Med Rx.   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    Past Surgical History:  Procedure Laterality Date   ANKLE SURGERY Left    Fracture   CARDIAC CATHETERIZATION N/A 09/22/2016   Procedure: Left Heart Cath and Coronary Angiography;  Surgeon: Marykay Lex, MD;  Location: [facility] INVASIVE CV LAB;  Service: Cardiovascular;  Laterality: N/A;   COLONOSCOPY WITH PROPOFOL N/A 07/05/2015   Procedure: COLONOSCOPY WITH PROPOFOL;  Surgeon: Scot Jun, MD;  Location: [facility] ENDOSCOPY;  Service: Endoscopy;  Laterality: N/A;   CYSTOCELE REPAIR N/A 04/29/2020   Procedure: ANTERIOR REPAIR (CYSTOCELE);  Surgeon: Schermerhorn, Ihor Austin, MD;   Location: [facility] ORS;  Service: Gynecology;  Laterality: N/A;   DILATION AND CURETTAGE OF UTERUS     ESOPHAGOGASTRODUODENOSCOPY (EGD) WITH PROPOFOL N/A 03/19/2016   Procedure: ESOPHAGOGASTRODUODENOSCOPY (EGD) WITH PROPOFOL;  Surgeon: Scot Jun, MD;  Location: [facility] ENDOSCOPY;  Service: Endoscopy;  Laterality: N/A;   EYE SURGERY Right    cataract   TONSILLECTOMY     VAGINAL HYSTERECTOMY N/A 04/29/2020   Procedure: HYSTERECTOMY VAGINAL;  Surgeon: Schermerhorn, Ihor Austin, MD;  Location: [facility] ORS;  Service: Gynecology;  Laterality: N/A;    There were no vitals filed for this visit.   Subjective Assessment - 10/14/20 0903    Subjective Pt. reports no falls and no increase c/o back pain over a busy holiday weekend.  Pt. discussed if she should contact PCP to get referral for Neuro MD to discuss LE symptoms.  Pt. wants to decrease frequency with PT and will focus on PT 1x/week with a consistent walking/HEP.    Limitations Lifting;Standing;Walking;House hold activities    Diagnostic tests (-) low back X-ray    Patient Stated Goals Improve balance/ gait.  Decrease R low back pain.    Currently in Pain? No/denies             There.ex.:  Nustep L4 10 min. B UE/LE  4# seated ex.: LAQ/ marching/ heel raises 20x. Walking in //-bars forward/  backwards/ lateral 5x each.  Reviewed HEP   Neuro.:  Walking in hallway without use of SPC working on sequencing/ upright posture.Posture correction/ heel strike to toe off and cuing to correct BOS.    Uneven blue mat walking with CGA for safety/ cuing to increase heel strike  Stairs: ascending with recip. Pattern/ descending with step to pattern.  Pt. Benefits from 1 handrail assist for safety.     Walking cone taps at agility ladder with mirror feedback 2x.  CGA for safety.  Pt. Able to complete with increase cadence vs slow controlled movement pattern.       PT Long Term Goals - 09/30/20 1745      PT LONG TERM GOAL #1   Title  Pt. will increase FOTO to 73 to improve pain-free functional mobility.    Baseline Initial FOTO: 60    Time 4    Period Weeks    Status New    Target Date 10/28/20      PT LONG TERM GOAL #2   Title Pt. will be independent with HEP to increase B hip/ ankle strength 1 full muscle grade to improve gait/ balance.    Baseline B LE muscle strength grossly 5/5 MMT except hip flexion 4/5 MMT/ hip abduction 4/5 MMT/ ankle DF and PF 2/5 MMT.    Time 4    Period Weeks    Status New    Target Date 10/28/20      PT LONG TERM GOAL #3   Title Pt. will increase Berg balance test to >50/56 to improve safety/ independence with walking.    Baseline Berg: 47/56    Time 4    Period Weeks    Status New    Target Date 10/28/20      PT LONG TERM GOAL #4   Title Pt. will ambulate with least restrictive device community distances with no loss of balance.    Baseline Pt. currently ambulates with no assistive device and ataxic gait.  Pt. will benefit from [facility] currently.    Time 4    Period Weeks    Status New    Target Date 10/28/20                 Plan - 10/14/20 0902    Clinical Impression Statement Pt. requires CGA with all dynamic balance/ walking tasks outside of //-bars.  Pt. unable to safely ascend/ descend stairs with recip. pattern and no handrail.  Pt. able to complete step to pattern with no UE assist but PT recommends 1 handrail, esp. with descending.  L quad/ hip fatigue noted during resisted ther.ex.  Pt. will continue with skilled PT services 1x/week with focus on HEP.    Stability/Clinical Decision Making Evolving/Moderate complexity    Clinical Decision Making Moderate    Rehab Potential Good    PT Frequency 2x / week    PT Duration 4 weeks    PT Treatment/Interventions ADLs/Self Care Home Management;Cryotherapy;Electrical Stimulation;Moist Heat;Functional mobility training;Stair training;Gait training;DME Instruction;Therapeutic activities;Therapeutic exercise;Balance  training;Neuromuscular re-education;Manual techniques;Patient/family education;Passive range of motion    PT Next Visit Plan Dynamic balance activites/ progress LE strengthening ex. program.    PT Home Exercise Plan MedBridge           Patient will benefit from skilled therapeutic intervention in order to improve the following deficits and impairments:  Abnormal gait,Improper body mechanics,Pain,Postural dysfunction,Decreased mobility,Decreased activity tolerance,Decreased endurance,Decreased range of motion,Decreased strength,Impaired flexibility,Difficulty walking,Decreased safety awareness,Decreased balance  Visit Diagnosis: Balance problem  Muscle weakness (generalized)  Gait difficulty  Acute right-sided low back pain without sciatica     Problem List Patient Active Problem List   Diagnosis Date Noted   Frequent PVCs 03/28/2018   Chest discomfort 03/26/2018   Bradycardia with 31-40 beats per minute 09/21/2016   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 [place], [place], [address] Phone: [phone]   Fax:  [phone]  Name: Alejandra Singh MRN: [id number] Date of Birth: November 14, 1940

## 2020-10-16 ENCOUNTER — Ambulatory Visit: Payer: Medicare Other | Admitting: Physical Therapy

## 2020-10-21 ENCOUNTER — Ambulatory Visit: Payer: Medicare Other | Admitting: Physical Therapy

## 2020-10-22 ENCOUNTER — Ambulatory Visit: Payer: Medicare Other | Attending: Family Medicine | Admitting: Physical Therapy

## 2020-10-22 ENCOUNTER — Encounter: Payer: Self-pay | Admitting: Physical Therapy

## 2020-10-22 ENCOUNTER — Other Ambulatory Visit: Payer: Self-pay

## 2020-10-22 DIAGNOSIS — R269 Unspecified abnormalities of gait and mobility: Secondary | ICD-10-CM | POA: Diagnosis present

## 2020-10-22 DIAGNOSIS — R2689 Other abnormalities of gait and mobility: Secondary | ICD-10-CM | POA: Diagnosis present

## 2020-10-22 DIAGNOSIS — M6281 Muscle weakness (generalized): Secondary | ICD-10-CM | POA: Diagnosis present

## 2020-10-22 DIAGNOSIS — M545 Low back pain, unspecified: Secondary | ICD-10-CM | POA: Diagnosis present

## 2020-10-22 NOTE — Therapy (Signed)
[facility] [address] [place], [place], [address] Phone: [phone]   Fax:  [phone]  Physical Therapy Treatment  Patient Details  Name: Alejandra Singh MRN: [id number] Date of Birth: 04-26-1941 Referring Provider (PT): Shane Crutch, [place]   Encounter Date: 10/22/2020   PT End of Session - 10/22/20 0856    Visit Number 5    Number of Visits 9    Date for PT Re-Evaluation 10/28/20    Authorization - Visit Number 5    Authorization - Number of Visits 10    PT Start Time 0849    PT Stop Time 0940    PT Time Calculation (min) 51 min    Equipment Utilized During Treatment Gait belt    Activity Tolerance Patient tolerated treatment well    Behavior During Therapy [facility] for tasks assessed/performed           Past Medical History:  Diagnosis Date  . Arthritis   . Essential hypertension   . FH: colonic polyps   . Frequent PVCs    RV outflow tract  . GERD (gastroesophageal reflux disease)   . Hyperlipidemia   . Hypothyroidism   . Iron deficiency anemia   . Non-obstructive CAD (coronary artery disease)    a. 08/2015 ETT: Ex time 6:18. No ST/T changes;  b. 09/2016 Cath: LM nl, LAD 50d, small bridging, LCX nl, RCA nl, EF 55-65%-->Med Rx.  . Osteoporosis   . 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 ANKLE SURGERY Left    Fracture  . CARDIAC CATHETERIZATION N/A 09/22/2016   Procedure: Left Heart Cath and Coronary Angiography;  Surgeon: Marykay Lex, MD;  Location: [facility] INVASIVE CV LAB;  Service: Cardiovascular;  Laterality: N/A;  . COLONOSCOPY WITH PROPOFOL N/A 07/05/2015   Procedure: COLONOSCOPY WITH PROPOFOL;  Surgeon: Scot Jun, MD;  Location[facility] ENDOSCOPY;  Service: Endoscopy;  Laterality: N/A;  . CYSTOCELE REPAIR N/A 04/29/2020   Procedure: ANTERIOR REPAIR (CYSTOCELE);  Surgeon: Schermerhorn, Ihor Austin, MD;   Location: [facility] ORS;  Service: Gynecology;  Laterality: N/A;  . DILATION AND CURETTAGE OF UTERUS    . ESOPHAGOGASTRODUODENOSCOPY (EGD) WITH PROPOFOL N/A 03/19/2016   Procedure: ESOPHAGOGASTRODUODENOSCOPY (EGD) WITH PROPOFOL;  Surgeon: Scot Jun, MD;  Location: [facility] ENDOSCOPY;  Service: Endoscopy;  Laterality: N/A;  . EYE SURGERY Right    cataract  . TONSILLECTOMY    . VAGINAL HYSTERECTOMY N/A 04/29/2020   Procedure: HYSTERECTOMY VAGINAL;  Surgeon: Schermerhorn, Ihor Austin, MD;  Location: [facility] ORS;  Service: Gynecology;  Laterality: N/A;    There were no vitals filed for this visit.   Subjective Assessment - 10/22/20 0851    Subjective Pt. states L lower leg feels numb, especially at night time.  Pt. reports movement/ walking makes L lower leg feel better.  Pt. states she hasn't had back pain recently.    Limitations Lifting;Standing;Walking;House hold activities    Diagnostic tests (-) low back X-ray    Patient Stated Goals Improve balance/ gait.  Decrease R low back pain.    Currently in Pain? No/denies            There.ex.:  Nustep L410 min. B UE/LE (discussed activity over weekend).    4# seated ex.: LAQ/ marching/ heel raises 20x. Walking in //-bars forward/ backwards/ lateral 5x each.    Neuro.:  Standing heel touches on 6" step (4#  ankle wts).  Functional reaching to cones (SBA/CGA for safety,esp. When reaching across body).   Stairs: ascending with recip. Pattern/ descending with step to pattern.  Pt. Benefits from 1 handrail assist for safety.     Walking in hallway with no assistive device with consistent step pattern/ arm swing/ upright posture/ heel strike.          PT Long Term Goals - 09/30/20 1745      PT LONG TERM GOAL #1   Title Pt. will increase FOTO to 73 to improve pain-free functional mobility.    Baseline Initial FOTO: 60    Time 4    Period Weeks    Status New    Target Date 10/28/20      PT LONG TERM GOAL #2   Title Pt. will  be independent with HEP to increase B hip/ ankle strength 1 full muscle grade to improve gait/ balance.    Baseline B LE muscle strength grossly 5/5 MMT except hip flexion 4/5 MMT/ hip abduction 4/5 MMT/ ankle DF and PF 2/5 MMT.    Time 4    Period Weeks    Status New    Target Date 10/28/20      PT LONG TERM GOAL #3   Title Pt. will increase Berg balance test to >50/56 to improve safety/ independence with walking.    Baseline Berg: 47/56    Time 4    Period Weeks    Status New    Target Date 10/28/20      PT LONG TERM GOAL #4   Title Pt. will ambulate with least restrictive device community distances with no loss of balance.    Baseline Pt. currently ambulates with no assistive device and ataxic gait.  Pt. will benefit from [facility] currently.    Time 4    Period Weeks    Status New    Target Date 10/28/20                 Plan - 10/22/20 0857    Clinical Impression Statement No c/o R low back pain at this time.  Pt. focused on step pattern/ heel strike and proper head position while walking in hallway.  Pt. planning to get ankle wts. and will increase resistance on bike to improve LE muscle strengthening/ endurance.  Pt. challenged during [employer] and step touching ex. in //-bars.  Light UE assist to maintain balance and assist with self-correction.  No change to HEP and pt. instructed to continue with SPC use, esp. while outside walking.    Stability/Clinical Decision Making Evolving/Moderate complexity    Clinical Decision Making Moderate    Rehab Potential Good    PT Frequency 2x / week    PT Duration 4 weeks    PT Treatment/Interventions ADLs/Self Care Home Management;Cryotherapy;Electrical Stimulation;Moist Heat;Functional mobility training;Stair training;Gait training;DME Instruction;Therapeutic activities;Therapeutic exercise;Balance training;Neuromuscular re-education;Manual techniques;Patient/family education;Passive range of motion    PT Next Visit Plan Dynamic balance  activites/ progress LE strengthening ex. program.    [place]           Patient will benefit from skilled therapeutic intervention in order to improve the following deficits and impairments:  Abnormal gait,Improper body mechanics,Pain,Postural dysfunction,Decreased mobility,Decreased activity tolerance,Decreased endurance,Decreased range of motion,Decreased strength,Impaired flexibility,Difficulty walking,Decreased safety awareness,Decreased balance  Visit Diagnosis: Balance problem  Muscle weakness (generalized)  Gait difficulty  Acute right-sided low back pain without sciatica     Problem List Patient Active Problem List   Diagnosis  Date Noted  . 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 Chest discomfort 03/26/2018  . Bradycardia with 31-40 beats per minute 09/21/2016  . 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 Hypothyroidism   . Gastroesophageal reflux disease   . Coronary artery calcification seen on CAT scan 03/22/2016  . Essential hypertension, benign 08/28/2010  . Paroxysmal supraventricular tachycardia ([place]) 08/28/2010  . Mixed hyperlipidemia 05/14/2009   Pura Spice, PT, DPT # [phone] 10/22/2020, 1:24 PM  [facility] [address] [place], [place], [address] Phone: [phone]   Fax:  [phone]  Name: Alejandra Singh MRN: [id number] Date of Birth: [birth year]

## 2020-10-23 ENCOUNTER — Encounter: Payer: Medicare Other | Admitting: Physical Therapy

## 2020-10-28 ENCOUNTER — Other Ambulatory Visit: Payer: Self-pay

## 2020-10-28 ENCOUNTER — Encounter: Payer: Self-pay | Admitting: Physical Therapy

## 2020-10-28 ENCOUNTER — Ambulatory Visit: Payer: Medicare Other | Admitting: Physical Therapy

## 2020-10-28 DIAGNOSIS — M6281 Muscle weakness (generalized): Secondary | ICD-10-CM

## 2020-10-28 DIAGNOSIS — R269 Unspecified abnormalities of gait and mobility: Secondary | ICD-10-CM

## 2020-10-28 DIAGNOSIS — R2689 Other abnormalities of gait and mobility: Secondary | ICD-10-CM

## 2020-10-28 DIAGNOSIS — M545 Low back pain, unspecified: Secondary | ICD-10-CM

## 2020-10-28 NOTE — Therapy (Signed)
[facility] [address] [place], [place], [address] Phone: [phone]   Fax:  [phone]  Physical Therapy Treatment  Patient Details  Name: Alejandra Singh MRN: [id number] Date of Birth: June 16, 1941 Referring Provider (PT): Shane Crutch, [place]   Encounter Date: 10/28/2020   PT End of Session - 10/28/20 1018    Visit Number 6    Number of Visits 9    Date for PT Re-Evaluation 10/28/20    Authorization - Visit Number 6    Authorization - Number of Visits 10    PT Start Time 0850    PT Stop Time 0947    PT Time Calculation (min) 57 min    Equipment Utilized During Treatment Gait belt    Activity Tolerance Patient tolerated treatment well    Behavior During Therapy [facility] for tasks assessed/performed           Past Medical History:  Diagnosis Date  . Arthritis   . Essential hypertension   . FH: colonic polyps   . Frequent PVCs    RV outflow tract  . GERD (gastroesophageal reflux disease)   . Hyperlipidemia   . Hypothyroidism   . Iron deficiency anemia   . Non-obstructive CAD (coronary artery disease)    a. 08/2015 ETT: Ex time 6:18. No ST/T changes;  b. 09/2016 Cath: LM nl, LAD 50d, small bridging, LCX nl, RCA nl, EF 55-65%-->Med Rx.  . Osteoporosis   . 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 ANKLE SURGERY Left    Fracture  . CARDIAC CATHETERIZATION N/A 09/22/2016   Procedure: Left Heart Cath and Coronary Angiography;  Surgeon: Marykay Lex, MD;  Location: [facility] INVASIVE CV LAB;  Service: Cardiovascular;  Laterality: N/A;  . COLONOSCOPY WITH PROPOFOL N/A 07/05/2015   Procedure: COLONOSCOPY WITH PROPOFOL;  Surgeon: Scot Jun, MD;  Location: [facility] ENDOSCOPY;  Service: Endoscopy;  Laterality: N/A;  . CYSTOCELE REPAIR N/A 04/29/2020   Procedure: ANTERIOR REPAIR (CYSTOCELE);  Surgeon: Schermerhorn, Ihor Austin, MD;   Location: [facility] ORS;  Service: Gynecology;  Laterality: N/A;  . DILATION AND CURETTAGE OF UTERUS    . ESOPHAGOGASTRODUODENOSCOPY (EGD) WITH PROPOFOL N/A 03/19/2016   Procedure: ESOPHAGOGASTRODUODENOSCOPY (EGD) WITH PROPOFOL;  Surgeon: Scot Jun, MD;  Location: [facility] ENDOSCOPY;  Service: Endoscopy;  Laterality: N/A;  . EYE SURGERY Right    cataract  . TONSILLECTOMY    . VAGINAL HYSTERECTOMY N/A 04/29/2020   Procedure: HYSTERECTOMY VAGINAL;  Surgeon: Schermerhorn, Ihor Austin, MD;  Location: [facility] ORS;  Service: Gynecology;  Laterality: N/A;    There were no vitals filed for this visit.   Subjective Assessment - 10/28/20 1014    Subjective Pt. states L lower leg remains primary issue with walking.   No c/o back pain prior to PT tx. session.    Limitations Lifting;Standing;Walking;House hold activities    Diagnostic tests (-) low back X-ray    Patient Stated Goals Improve balance/ gait.  Decrease R low back pain.    Currently in Pain? No/denies            There.ex.:  Nustep L510 min. B UE/LE (discussed activity over weekend).    4# seated ex.: LAQ/ marching/ heel raises 20x. Standing hip extension/ abduction/ heel raises (difficult)/ step touches 20x each.      Neuro.:  Walking in PT clinic with mirror feedback working on posture correction/ consistent  arm swing.  Pt. Instructed to maintain proper BOS/ heel strike.    Functional reaching to cones (SBA for safety,esp. When reaching across body).   Walking cone taps in //-bars with mirror feedback 4x (CGA for safety/ verbal cuing)  Walking in hallway with no assistive device with consistent step pattern/ arm swing/ upright posture/ heel strike.        PT Long Term Goals - 09/30/20 1745      PT LONG TERM GOAL #1   Title Pt. will increase FOTO to 73 to improve pain-free functional mobility.    Baseline Initial FOTO: 60    Time 4    Period Weeks    Status New    Target Date 10/28/20      PT LONG TERM GOAL #2    Title Pt. will be independent with HEP to increase B hip/ ankle strength 1 full muscle grade to improve gait/ balance.    Baseline B LE muscle strength grossly 5/5 MMT except hip flexion 4/5 MMT/ hip abduction 4/5 MMT/ ankle DF and PF 2/5 MMT.    Time 4    Period Weeks    Status New    Target Date 10/28/20      PT LONG TERM GOAL #3   Title Pt. will increase Berg balance test to >50/56 to improve safety/ independence with walking.    Baseline Berg: 47/56    Time 4    Period Weeks    Status New    Target Date 10/28/20      PT LONG TERM GOAL #4   Title Pt. will ambulate with least restrictive device community distances with no loss of balance.    Baseline Pt. currently ambulates with no assistive device and ataxic gait.  Pt. will benefit from [facility] currently.    Time 4    Period Weeks    Status New    Target Date 10/28/20                 Plan - 10/28/20 1019    Clinical Impression Statement Pt. has difficulty maintaining center of gravity/balance during LE propriocptive tasks.  Pt. had increase difficulty with step touches while wearing 4# ankle wts.  Pt. continues to present with R knee valgus/ L knee varus during gait assessment with mirror feedback.  PT focused on preventing lateral steps/ scissoring gait while walking in middle of hallway with proper posture/ arm swing.    Stability/Clinical Decision Making Evolving/Moderate complexity    Clinical Decision Making Moderate    Rehab Potential Good    PT Frequency 2x / week    PT Duration 4 weeks    PT Treatment/Interventions ADLs/Self Care Home Management;Cryotherapy;Electrical Stimulation;Moist Heat;Functional mobility training;Stair training;Gait training;DME Instruction;Therapeutic activities;Therapeutic exercise;Balance training;Neuromuscular re-education;Manual techniques;Patient/family education;Passive range of motion    PT Next Visit Plan Dynamic balance activites/ progress LE strengthening ex. program.  REASSESS GOALS/  RECERT vs. DISCHARGE.    PT Home Exercise Plan [place]           Patient will benefit from skilled therapeutic intervention in order to improve the following deficits and impairments:  Abnormal gait,Improper body mechanics,Pain,Postural dysfunction,Decreased mobility,Decreased activity tolerance,Decreased endurance,Decreased range of motion,Decreased strength,Impaired flexibility,Difficulty walking,Decreased safety awareness,Decreased balance  Visit Diagnosis: Balance problem  Muscle weakness (generalized)  Gait difficulty  Acute right-sided low back pain without sciatica     Problem List Patient Active Problem List   Diagnosis Date Noted  . 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 Chest discomfort 03/26/2018  .  Bradycardia with 31-40 beats per minute 09/21/2016  . 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 Hypothyroidism   . Gastroesophageal reflux disease   . Coronary artery calcification seen on CAT scan 03/22/2016  . Essential hypertension, benign 08/28/2010  . Paroxysmal supraventricular tachycardia ([place]) 08/28/2010  . Mixed hyperlipidemia 05/14/2009   Pura Spice, PT, DPT # [phone] 10/28/2020, 10:23 AM  [facility] [address] [place], [place], [address] Phone: [phone]   Fax:  [phone]  Name: Alejandra Singh MRN: [id number] Date of Birth: [birth year]

## 2020-10-30 ENCOUNTER — Encounter: Payer: Medicare Other | Admitting: Physical Therapy

## 2020-11-04 ENCOUNTER — Ambulatory Visit: Payer: Medicare Other | Admitting: Physical Therapy

## 2020-11-05 ENCOUNTER — Other Ambulatory Visit: Payer: Self-pay | Admitting: Cardiology

## 2020-11-06 ENCOUNTER — Encounter: Payer: Medicare Other | Admitting: Physical Therapy

## 2020-11-07 ENCOUNTER — Encounter: Payer: Self-pay | Admitting: Cardiology

## 2020-11-07 ENCOUNTER — Ambulatory Visit (INDEPENDENT_AMBULATORY_CARE_PROVIDER_SITE_OTHER): Payer: Medicare Other | Admitting: Cardiology

## 2020-11-07 ENCOUNTER — Other Ambulatory Visit: Payer: Self-pay

## 2020-11-07 VITALS — BP 148/78 | HR 70 | Ht 65.0 in | Wt 150.0 lb

## 2020-11-07 DIAGNOSIS — I493 Ventricular premature depolarization: Secondary | ICD-10-CM

## 2020-11-07 DIAGNOSIS — I2510 Atherosclerotic heart disease of native coronary artery without angina pectoris: Secondary | ICD-10-CM | POA: Diagnosis not present

## 2020-11-07 NOTE — Progress Notes (Signed)
Cardiology Office Note  Date: 11/07/2020   ID: [name], Alejandra [birth year] Singh, MRN [id number]  PCP:  [facility] [place]  Cardiologist:  Rozann Lesches, MD Electrophysiologist:  None   Chief Complaint  Patient presents with  . Cardiac follow-up    History of Present Illness: Alejandra Singh is a 80 y.o. female last assessed via telehealth encounter in June 2021.  She presents for a routine follow-up visit.  Reports no palpitations or chest pain, tolerating her medications well.  She is undergoing physical therapy for management of leg weakness.  She did have a fall in the interim, slipped on some steps with resulting back pain.  She has had no syncope.  I reviewed her medications which are outlined below.  She reports compliance.  I reviewed her interval lab work as noted below.  I personally reviewed her ECG today which shows sinus rhythm with nonspecific ST-T changes, normal QRS and QTc.  Past Medical History:  Diagnosis Date  . Arthritis   . Essential hypertension   . FH: colonic polyps   . Frequent PVCs    RV outflow tract  . GERD (gastroesophageal reflux disease)   . Hyperlipidemia   . Hypothyroidism   . Iron deficiency anemia   . Non-obstructive CAD (coronary artery disease)    a. 08/2015 ETT: Ex time 6:18. No ST/T changes;  b. 09/2016 Cath: LM nl, LAD 50d, small bridging, LCX nl, RCA nl, EF 55-65%-->Med Rx.  . Osteoporosis   . 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 ANKLE SURGERY Left    Fracture  . CARDIAC CATHETERIZATION N/A 09/22/2016   Procedure: Left Heart Cath and Coronary Angiography;  Surgeon: Leonie Man, MD;  Location: [facility] CV LAB;  Service: Cardiovascular;  Laterality: N/A;  . COLONOSCOPY WITH PROPOFOL N/A 07/05/2015   Procedure: COLONOSCOPY WITH PROPOFOL;  Surgeon: Manya Silvas, MD;  Location: [facility] ENDOSCOPY;  Service:  Endoscopy;  Laterality: N/A;  . CYSTOCELE REPAIR N/A 04/29/2020   Procedure: ANTERIOR REPAIR (CYSTOCELE);  Surgeon: Schermerhorn, Gwen Her, MD;  Location: [facility] ORS;  Service: Gynecology;  Laterality: N/A;  . DILATION AND CURETTAGE OF UTERUS    . ESOPHAGOGASTRODUODENOSCOPY (EGD) WITH PROPOFOL N/A 03/19/2016   Procedure: ESOPHAGOGASTRODUODENOSCOPY (EGD) WITH PROPOFOL;  Surgeon: Manya Silvas, MD;  Location: [facility] ENDOSCOPY;  Service: Endoscopy;  Laterality: N/A;  . EYE SURGERY Right    cataract  . TONSILLECTOMY    . VAGINAL HYSTERECTOMY N/A 04/29/2020   Procedure: HYSTERECTOMY VAGINAL;  Surgeon: Schermerhorn, Gwen Her, MD;  Location: [facility] ORS;  Service: Gynecology;  Laterality: N/A;    Current Outpatient Medications  Medication Sig Dispense Refill  . amLODipine (NORVASC) 2.5 MG tablet Take 1 tablet (2.5 mg total) by mouth daily. 90 tablet 1  . aspirin EC 81 MG tablet Take 81 mg by mouth daily.    Marland Kitchen esomeprazole (NEXIUM) 40 MG capsule Take 40 mg by mouth daily as needed (reflux).     . flecainide (TAMBOCOR) 50 MG tablet Take 1 tablet by mouth twice daily 180 tablet 3  . levothyroxine (SYNTHROID, LEVOTHROID) 50 MCG tablet Take 50 mcg by mouth daily before breakfast.    . Multiple Vitamins-Minerals (MULTIVITAL) tablet Take 1 tablet by mouth daily.    . rosuvastatin (CRESTOR) 10 MG tablet TAKE 1 TABLET BY MOUTH ONCE DAILY (STOP  SIMVASTATIN  BEFORE  STARTING  THIS) 90 tablet  3  . hydrOXYzine (ATARAX/VISTARIL) 25 MG tablet SMARTSIG:0.5-1 Pill By Mouth Twice Daily     No current facility-administered medications for this visit.   Allergies:  Alendronate   ROS: Chronic leg weakness.  Physical Exam: VS:  BP (!) 148/78   Pulse 70   Ht 5\' 5"  (1.651 m)   Wt 150 lb (68 kg)   SpO2 96%   BMI 24.96 kg/m , BMI Body mass index is 24.96 kg/m.  Wt Readings from Last 3 Encounters:  11/07/20 150 lb (68 kg)  04/29/20 147 lb 11.3 oz (67 kg)  04/23/20 148 lb (67.1 kg)    General: Elderly woman,  appears comfortable at rest. HEENT: Conjunctiva and lids normal, wearing a mask. Neck: Supple, no elevated JVP or carotid bruits, no thyromegaly. Lungs: Clear to auscultation, nonlabored breathing at rest. Cardiac: Regular rate and rhythm, no S3 or significant systolic murmur, no pericardial rub. Extremities: No pitting edema.  ECG:  An ECG dated 04/14/2018 was personally reviewed today and demonstrated:  Sinus bradycardia with nonspecific ST changes.  Recent Labwork: 04/25/2020: BUN 13; Creatinine, Ser 0.63; Hemoglobin 13.3; Platelets 232; Potassium 4.1; Sodium 136  May 2021: Hemoglobin 13.9, platelets 308, BUN 12, creatinine 0.61, potassium 4.6, AST 19, ALT 17, cholesterol 158, triglycerides 125, HDL 69, LDL 67, TSH 2.11 September 2020: Cholesterol 173, HDL 68, LDL 86, triglycerides 108, BUN 14, creatinine 0.59, potassium 4.5  Other Studies Reviewed Today:  Echocardiogram 03/27/2018: Study Conclusions  - Left ventricle: The cavity size was normal. Wall thickness was normal. Systolic function was normal. The estimated ejection fraction was in the range of 50% to 55%. - Mitral valve: There was mild regurgitation.  Cardiac CT 03/26/2018: IMPRESSION: 1. Coronary calcium score of 0. This was 0 percentile for age and sex matched control.  2. Normal coronary origin with right dominance.  3. Mild CAD in the proximal to mid LAD with stenosis 25-50%. Continue aggressive medical management.  Assessment and Plan:  1. History of frequent PVCs from the outflow tract, doing well with suppression on flecainide.  ECG reviewed today.  No changes made in present regimen.  2. Nonobstructive coronary atherosclerosis, LAD distribution by cardiac CTA in 2019.  No active angina symptoms.  She is on low-dose aspirin along with Crestor.  Medication Adjustments/Labs and Tests Ordered: Current medicines are reviewed at length with the patient today.  Concerns regarding medicines are outlined above.    Tests Ordered: Orders Placed This Encounter  Procedures  . EKG 12-Lead    Medication Changes: No orders of the defined types were placed in this encounter.   Disposition:  Follow up 6 months in the [place] office.  Signed, Satira Sark, MD, [facility] 11/07/2020 1:20 PM    [place] [facility] [address], [place], [place] [address] Phone: [phone]; Fax: [phone]

## 2020-11-07 NOTE — Patient Instructions (Signed)
Medication Instructions:  Your physician recommends that you continue on your current medications as directed. Please refer to the Current Medication list given to you today.  *If you need a refill on your cardiac medications before your next appointment, please call your pharmacy*   Lab Work: None Today If you have labs (blood work) drawn today and your tests are completely normal, you will receive your results only by: . MyChart Message (if you have MyChart) OR . A paper copy in the mail If you have any lab test that is abnormal or we need to change your treatment, we will call you to review the results.   Testing/Procedures: None Today   Follow-Up: At [facility], you and your health needs are our priority.  As part of our continuing mission to provide you with exceptional heart care, we have created designated Provider Care Teams.  These Care Teams include your primary Cardiologist (physician) and Advanced Practice Providers (APPs -  Physician Assistants and Nurse Practitioners) who all work together to provide you with the care you need, when you need it.  We recommend signing up for the patient portal called "MyChart".  Sign up information is provided on this After Visit Summary.  MyChart is used to connect with patients for Virtual Visits (Telemedicine).  Patients are able to view lab/test results, encounter notes, upcoming appointments, etc.  Non-urgent messages can be sent to your provider as well.   To learn more about what you can do with MyChart, go to [email or web address].    Your next appointment:   6 month(s)  The format for your next appointment:   In Person  Provider:   Samuel McDowell, MD   Other Instructions None Today     

## 2021-03-08 ENCOUNTER — Other Ambulatory Visit: Payer: Self-pay | Admitting: Cardiology

## 2021-03-11 ENCOUNTER — Other Ambulatory Visit: Payer: Self-pay | Admitting: Cardiology

## 2021-05-12 ENCOUNTER — Encounter: Payer: Self-pay | Admitting: Physician Assistant

## 2021-05-12 NOTE — Progress Notes (Addendum)
Cardiology Office Note    Date:  05/13/2021   ID:  Ziona, Cicero [birth year], MRN [id number]  PCP:  [facility]  Cardiologist:  Rozann Lesches, MD  Electrophysiologist:  None   Chief Complaint: f/u PVCs, PSVT  History of Present Illness:   Alejandra Singh is a 80 y.o. female with history of frequent PVCS, PSVT, GERD, HLD, HTN, hypothyroidism, iron deficiency anemia, nonobstructive CAD, aortic atherosclerosis who presents for follow-up. She had a prior stress echo in 2016 that was negative. In 2017 she underwent cath showing 50% distal LAD - "on exam, with a small bridging segment with kinking" - otherwise normal coronary arteries. She was intermittently in SVT during the case. Event monitor in 2018 showed NSR average HR 64bpm (range 46->108) with frequent PVCs with some ventricular bigeminy. Burden not reported on report. 2D echo 03/2018 showed EF 50-55%, mild MR. She was started on flecainide. Coronary CT 03/2018 showed calcium score of zero, mild CAD in the prox-mid LAD (25-50%). Repeat ETT 04/2018 showed hypertensive response, isolated PVCs/bigeminy at rest, resolving with exercise, recurring in recovery, no significant PR or QRS prolongation on flecainide and no exercise induced arrhythmias, baseline inferior and lateral precordial ST/T changes more prominent with stress (nonspecific findings) overall felt reassuring. Repeat 24hr monitor 08/2018 showed NSR, average HR 63bpm, range 49-105bpm, occasional PVCs with some ventricular trigeminy and a single couplet, only 3.7% of total beats.  She is seen back for follow-up overall doing very well from a cardiac standpoint. Her palpitations are very infrequent at this point and rarely occur. No sustained tachypalpitations, chest pain, SOB, syncope or dizziness. She continues to have some generalized leg weakness and discomfort in her feet and shins at times. She notices the most after she has been sitting for a while. This was  evaluated by LE vascular testing in 2020 which was completely normal. She has been seen by orthopedics who did not identify a focal cause per her report.    Labwork independently reviewed: 04/2020 CBC wnl, K 4.1, glucose 67, Cr 0.63 02/2020 glucose wnl, LFTs ok, LDL 67, trig 125  Past Medical History:  Diagnosis Date   Arthritis    Essential hypertension    FH: colonic polyps    Frequent PVCs    RV outflow tract   GERD (gastroesophageal reflux disease)    Hyperlipidemia    Hypothyroidism    Iron deficiency anemia    Mild mitral regurgitation    Non-obstructive CAD (coronary artery disease)    a. 08/2015 ETT: Ex time 6:18. No ST/T changes;  b. 09/2016 Cath: LM nl, LAD 50d, small bridging, LCX nl, RCA nl, EF 55-65%-->Med Rx. c. Cor CT 03/2018 calcium score 0, 25-50% LAD.   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    Past Surgical History:  Procedure Laterality Date   ANKLE SURGERY Left    Fracture   CARDIAC CATHETERIZATION N/A 09/22/2016   Procedure: Left Heart Cath and Coronary Angiography;  Surgeon: Leonie Man, MD;  Location: [facility];  Service: Cardiovascular;  Laterality: N/A;   COLONOSCOPY WITH PROPOFOL N/A 07/05/2015   Procedure: COLONOSCOPY WITH PROPOFOL;  Surgeon: Manya Silvas, MD;  Location: [facility] ENDOSCOPY;  Service: Endoscopy;  Laterality: N/A;   CYSTOCELE REPAIR N/A 04/29/2020   Procedure: ANTERIOR REPAIR (CYSTOCELE);  Surgeon: Schermerhorn, Gwen Her, MD;  Location: [facility] ORS;  Service: Gynecology;  Laterality: N/A;   DILATION AND CURETTAGE  OF UTERUS     ESOPHAGOGASTRODUODENOSCOPY (EGD) WITH PROPOFOL N/A 03/19/2016   Procedure: ESOPHAGOGASTRODUODENOSCOPY (EGD) WITH PROPOFOL;  Surgeon: Manya Silvas, MD;  Location: [facility] ENDOSCOPY;  Service: Endoscopy;  Laterality: N/A;   EYE SURGERY Right    cataract   TONSILLECTOMY     VAGINAL HYSTERECTOMY N/A 04/29/2020   Procedure: HYSTERECTOMY VAGINAL;   Surgeon: Schermerhorn, Gwen Her, MD;  Location: [facility] ORS;  Service: Gynecology;  Laterality: N/A;    Current Medications: Current Meds  Medication Sig   amLODipine (NORVASC) 2.5 MG tablet Take 1 tablet by mouth once daily   aspirin EC 81 MG tablet Take 81 mg by mouth daily.   esomeprazole (NEXIUM) 40 MG capsule Take 40 mg by mouth daily as needed (reflux).    flecainide (TAMBOCOR) 50 MG tablet Take 1 tablet by mouth twice daily   hydrOXYzine (ATARAX/VISTARIL) 25 MG tablet SMARTSIG:0.5-1 Pill By Mouth Twice Daily   levothyroxine (SYNTHROID, LEVOTHROID) 50 MCG tablet Take 50 mcg by mouth daily before breakfast.   Multiple Vitamins-Minerals (MULTIVITAL) tablet Take 1 tablet by mouth daily.   rosuvastatin (CRESTOR) 10 MG tablet TAKE 1 TABLET BY MOUTH ONCE DAILY (STOP  SIMVASTATIN  BEFORE  STARTING  THIS)     Allergies:   Alendronate   Social History   Socioeconomic History   Marital status: Married    Spouse name: Not on file   Number of children: Not on file   Years of education: Not on file   Highest education level: Not on file  Occupational History   Occupation: Retired - insurance  Tobacco Use   Smoking status: Never   Smokeless tobacco: Never  Vaping Use   Vaping Use: Never used  Substance and Sexual Activity   Alcohol use: No   Drug use: No   Sexual activity: Not on file  Other Topics Concern   Not on file  Social History Narrative   Lives in [place] with husband.  Does not routinely exercise.   Social Determinants of Health   Financial Resource Strain: Not on file  Food Insecurity: Not on file  Transportation Needs: Not on file  Physical Activity: Not on file  Stress: Not on file  Social Connections: Not on file     Family History:  The patient's family history includes Alzheimer's disease in her mother; Dementia in her mother; Stroke in her father. There is no history of Coronary artery disease.  ROS:   Please see the history of present illness..  All  other systems are reviewed and otherwise negative.    EKGs/Labs/Other Studies Reviewed:    Studies reviewed are outlined and summarized above. Reports included below if pertinent.  Last Holter 08/2018 24-hour Holter monitor reviewed.  Sinus rhythm present.  Heart rate ranged from 49 bpm up to 105 bpm with average heart rate 63 bpm.  Occasional PVCs noted with some ventricular trigeminy and a single couplet.  Ventricular events represented only 3.7% of total beats.  There were no sustained arrhythmias or pauses.  ETT 04/2018 Blood pressure demonstrated a hypertensive response to exercise. There was no new ST segment deviation noted during stress. Baseline inferior and lateral precordial ST/T changes more prominent with stress, nonspecific findings Isolated PVCs and bigeminy at rest, resolve with exercise, reoccur in recovery. No NSVT or VT No significant PR or QRS prolongation on flecanide therapy. No exericse incuded arrhythmias  Cor CT 03/2018 FINDINGS: A 120 kV prospective scan was triggered in the descending thoracic aorta at 111 HU's. Axial  non-contrast 3 mm slices were carried out through the heart. The data set was analyzed on a dedicated work station and scored using the [place]. Gantry rotation speed was 250 msecs and collimation was .6 mm. No beta blockade and 0.4 mg of sl NTG was given. The 3D data set was reconstructed in 5% intervals of the 67-82 % of the R-R cycle. Diastolic phases were analyzed on a dedicated work station using MPR, MIP and VRT modes. The patient received 80 cc of contrast.   Aorta:  Normal size.  No calcifications.  No dissection.   Aortic Valve:  Trileaflet.  No calcifications.   Coronary Arteries:  Normal coronary origin.  Right dominance.   RCA is a large dominant artery that gives rise to PDA and PLVB. There is minimal plaque.   Left main is a large artery that gives rise to LAD and LCX arteries. Left main has no plaque.   LAD is a  large vessel that gives rise to two small diagonal arteries. There is minimal calcified plaque in the proximal LAD with associated stenosis 0-25%. There is mild calcified plaque in the proximal to mid LAD with stenosis 25-50%. Mid to distal LAD has no plaque.   D1 and D2 are small arteries with no significant stenosis.   LCX is a non-dominant artery that gives rise to one large OM1 branch. There is no plaque.   Other findings:   Normal pulmonary vein drainage into the left atrium.   Normal let atrial appendage without a thrombus.   Normal size of the pulmonary artery.   IMPRESSION: 1. Coronary calcium score of 0. This was 0 percentile for age and sex matched control.   2. Normal coronary origin with right dominance.   3. Mild CAD in the proximal to mid LAD with stenosis 25-50%. Continue aggressive medical management.   Electronically Signed: By: Ena Dawley On: 03/26/2018 17:07    2D echo 03/2018 - Left ventricle: The cavity size was normal. Wall thickness was    normal. Systolic function was normal. The estimated ejection    fraction was in the range of 50% to 55%.  - Mitral valve: There was mild regurgitation.  Cardiac Cath 2017 The left ventricular systolic function is normal. The left ventricular ejection fraction is 55-65% by visual estimate. LV end diastolic pressure is normal. Dist LAD lesion, 50 %stenosed - on exam, with a small bridging segment with kinking. Otherwise normal coronary arteries.   Angiographically, I do not see any significant coronary disease expanded the patient's symptoms. The distal LAD lesion is likely not significant, and there is at a bend with kinking suggestive of mild bridging. Not a good PCI target.   Patient also had a catheter-induced SVT and was hypertensive during the case. Suggested maybe her symptoms are related to hypertension and SVT related microvascular ischemia.   Plan: Return to nursing unit for ongoing care and  evaluation on telemetry. Consider event monitor. Likely non-macrovascular coronary disease-related chest pain    EKG:  EKG is ordered today, personally reviewed, demonstrating NSR 69bpm, nonspecific STTW changes inferiorly and V5-V6. Looking back through multiple tracings these were even noted on tracings back to 2015-2016. QTc is normal and QRS duration is within normal limits.  Recent Labs: No results found for requested labs within last 8760 hours.  Recent Lipid Panel    Component Value Date/Time   CHOL 200 06/18/2014 0820   TRIG 179 (H) 06/18/2014 0820   HDL 61 06/18/2014 0820  CHOLHDL 3.3 06/18/2014 0820   VLDL 36 06/18/2014 0820   LDLCALC 103 (H) 06/18/2014 0820    PHYSICAL EXAM:    VS:  BP 132/72   Pulse 68   Ht '5\' 5"'$  (1.651 m)   Wt 142 lb (64.4 kg)   SpO2 96%   BMI 23.63 kg/m   BMI: Body mass index is 23.63 kg/m.  GEN: Well nourished, well developed female in no acute distress HEENT: normocephalic, atraumatic Neck: no JVD, carotid bruits, or masses Cardiac: RRR; no murmurs, rubs, or gallops, no edema, excellent pedal pulses Respiratory:  clear to auscultation bilaterally, normal work of breathing GI: soft, nontender, nondistended, + BS MS: no deformity or atrophy Skin: warm and dry, no rash, no ulcerations Neuro:  Alert and Oriented x 3, Strength and sensation are intact, follows commands Psych: euthymic mood, full affect  Wt Readings from Last 3 Encounters:  05/13/21 142 lb (64.4 kg)  11/07/20 150 lb (68 kg)  04/29/20 147 lb 11.3 oz (67 kg)     ASSESSMENT & PLAN:   1. Frequent PVCs, also with history of PSVT - doing very well on flecainide, only rare infrequent palpitations, much improved from prior. She is pleased with how she is feeling. EKG shows normal QTc, PR interval and QRS duration with nonspecific STTW changes seen dating back to previous years. While patients who are on flecainide are often treated with a concomitant AVN blocking agent, Dr.  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 She will notify for any change in symptoms. I will update her BMET, Mg today.  2. Mild CAD - asymptomatic. She has been maintained on low dose ASA. Will obtain a f/ CBC today to ensure stability. See below regarding statin.  3. Essential HTN  - reasonably controlled. Continue present regimen.  4. Mild mitral regurgitation - prior echocardiogram showed EF 50-55%, mild MR. Guideline would suggest repeating every 3-5 years. Will go ahead and set up to ensure stability of valvular disease as well as LV function given ongoing flecainide use.  5. Leg pain - does not appear to be vascular in nature. LE arterial studies were normal in 2020, she has excellent pulses without evidence of arterial insufficiency, and no classic claudication-type symptoms. She was previously on simvastatin and is now on rosuvastatin. She wonders if this could be contributing. Will have her implement a 2 week drug holiday off statin. I asked her to call us after 2 weeks to give [place] an update on her symptoms. If she has not seen any change at that time, would probably restart the statin (addendum: may consider checking CK beforehand as it does not appear it was drawn today as previously planned). As such, will hold off on ordering repeat lipids at this time since we are interrupting her regimen, but can consider ordering at next OV if she has not had them done by primary care in the interim. I have also asked her to touch base with primary care about other evaluations such as possible vitamin/mineral deficiencies contributing to neuropathy.   Disposition: F/u with Dr. Domenic Polite in 6 months.  Medication Adjustments/Labs and Tests Ordered: Current medicines are reviewed at length with the patient today.  Concerns regarding medicines are outlined above. Medication changes, Labs and Tests  ordered today are summarized above and listed in the Patient Instructions accessible in Encounters.    Signed, Charlie Pitter, PA-C  05/13/2021 1:35 PM    [facility] Location in [place]. [place], [place] [address] Ph: [phone]; Fax [phone]

## 2021-05-13 ENCOUNTER — Other Ambulatory Visit (HOSPITAL_COMMUNITY)
Admission: RE | Admit: 2021-05-13 | Discharge: 2021-05-13 | Disposition: A | Discharge status: Home / Self Care | Payer: Medicare Other | Source: Ambulatory Visit | Attending: Physician Assistant | Admitting: Physician Assistant

## 2021-05-13 ENCOUNTER — Encounter: Payer: Self-pay | Admitting: Physician Assistant

## 2021-05-13 ENCOUNTER — Ambulatory Visit (INDEPENDENT_AMBULATORY_CARE_PROVIDER_SITE_OTHER): Payer: Medicare Other | Admitting: Physician Assistant

## 2021-05-13 ENCOUNTER — Other Ambulatory Visit: Payer: Self-pay

## 2021-05-13 VITALS — BP 132/72 | HR 68 | Ht 65.0 in | Wt 142.0 lb

## 2021-05-13 DIAGNOSIS — I2510 Atherosclerotic heart disease of native coronary artery without angina pectoris: Secondary | ICD-10-CM | POA: Diagnosis present

## 2021-05-13 DIAGNOSIS — I471 Supraventricular tachycardia, unspecified: Secondary | ICD-10-CM

## 2021-05-13 DIAGNOSIS — M79605 Pain in left leg: Secondary | ICD-10-CM | POA: Diagnosis present

## 2021-05-13 DIAGNOSIS — I340 Nonrheumatic mitral (valve) insufficiency: Secondary | ICD-10-CM | POA: Insufficient documentation

## 2021-05-13 DIAGNOSIS — I10 Essential (primary) hypertension: Secondary | ICD-10-CM | POA: Insufficient documentation

## 2021-05-13 DIAGNOSIS — M79604 Pain in right leg: Secondary | ICD-10-CM | POA: Insufficient documentation

## 2021-05-13 DIAGNOSIS — I493 Ventricular premature depolarization: Secondary | ICD-10-CM

## 2021-05-13 LAB — CBC
HCT: 41.3 % (ref 36.0–46.0)
Hemoglobin: 13.9 g/dL (ref 12.0–15.0)
MCH: 29.3 pg (ref 26.0–34.0)
MCHC: 33.7 g/dL (ref 30.0–36.0)
MCV: 87.1 fL (ref 80.0–100.0)
Platelets: 265 10*3/uL (ref 150–400)
RBC: 4.74 MIL/uL (ref 3.87–5.11)
RDW: 13.7 % (ref 11.5–15.5)
WBC: 6.2 10*3/uL (ref 4.0–10.5)
nRBC: 0 % (ref 0.0–0.2)

## 2021-05-13 LAB — BASIC METABOLIC PANEL
Anion gap: 6 (ref 5–15)
BUN: 12 mg/dL (ref 8–23)
CO2: 24 mmol/L (ref 22–32)
Calcium: 9.3 mg/dL (ref 8.9–10.3)
Chloride: 106 mmol/L (ref 98–111)
Creatinine, Ser: 0.58 mg/dL (ref 0.44–1.00)
GFR, Estimated: >60 mL/min (ref >60)
Glucose, Bld: 98 mg/dL (ref 70–99)
Potassium: 4 mmol/L (ref 3.5–5.1)
Sodium: 136 mmol/L (ref 135–145)

## 2021-05-13 LAB — MAGNESIUM: Magnesium: 2.4 mg/dL (ref 1.7–2.4)

## 2021-05-13 NOTE — Patient Instructions (Signed)
Medication Instructions:   STOP Crestor for 2 weeks to see if leg pain goes away. If it does, let us know. If it does not, re-start Crestor.  *If you need a refill on your cardiac medications before your next appointment, please call your pharmacy*   Lab Work: Cbc,bmet,magnesium Your physician has requested that you have an echocardiogram. Echocardiography is a painless test that uses sound waves to create images of your heart. It provides your doctor with information about the size and shape of your heart and how well your heart's chambers and valves are working. This procedure takes approximately one hour. There are no restrictions for this procedure.  If you have labs (blood work) drawn today and your tests are completely normal, you will receive your results only by: [place] (if you have MyChart) OR A paper copy in the mail If you have any lab test that is abnormal or we need to change your treatment, we will call you to review the results.   Testing/Procedures: our physician has requested that you have an echocardiogram. Echocardiography is a painless test that uses sound waves to create images of your heart. It provides your doctor with information about the size and shape of your heart and how well your heart's chambers and valves are working. This procedure takes approximately one hour. There are no restrictions for this procedure.     Follow-Up: At [facility], you and your health needs are our priority.  As part of our continuing mission to provide you with exceptional heart care, we have created designated Provider Care Teams.  These Care Teams include your primary Cardiologist (physician) and Advanced Practice Providers (APPs -  Physician Assistants and Nurse Practitioners) who all work together to provide you with the care you need, when you need it.  We recommend signing up for the patient portal called "MyChart".  Sign up information is provided on this After Visit  Summary.  MyChart is used to connect with patients for Virtual Visits (Telemedicine).  Patients are able to view lab/test results, encounter notes, upcoming appointments, etc.  Non-urgent messages can be sent to your provider as well.   To learn more about what you can do with MyChart, go to [email or web address].    Your next appointment:   6 month(s)  The format for your next appointment:   In Person  Provider:   Rozann Lesches, MD   Other Instructions None

## 2021-05-14 ENCOUNTER — Telehealth: Payer: Self-pay

## 2021-05-14 NOTE — Telephone Encounter (Signed)
Patient notified and voiced understanding. Pt had no questions or concerns at this time.

## 2021-05-14 NOTE — Telephone Encounter (Signed)
-----   Message from Alejandra Singh, [place] sent at 05/14/2021  7:48 AM EDT ----- Please let patient know labs look good. Electrolytes are all normal.

## 2021-05-17 ENCOUNTER — Other Ambulatory Visit: Payer: Self-pay | Admitting: Cardiology

## 2021-05-26 ENCOUNTER — Other Ambulatory Visit: Payer: Self-pay | Admitting: Family Medicine

## 2021-05-26 DIAGNOSIS — Z1231 Encounter for screening mammogram for malignant neoplasm of breast: Secondary | ICD-10-CM

## 2021-05-27 ENCOUNTER — Other Ambulatory Visit: Payer: Self-pay

## 2021-05-27 ENCOUNTER — Ambulatory Visit (HOSPITAL_COMMUNITY)
Admission: RE | Admit: 2021-05-27 | Discharge: 2021-05-27 | Disposition: A | Discharge status: Home / Self Care | Payer: Medicare Other | Source: Ambulatory Visit | Attending: Physician Assistant | Admitting: Physician Assistant

## 2021-05-27 DIAGNOSIS — I340 Nonrheumatic mitral (valve) insufficiency: Secondary | ICD-10-CM | POA: Insufficient documentation

## 2021-05-27 LAB — ECHOCARDIOGRAM COMPLETE
Area-P 1/2: 2.77 cm2
S' Lateral: 2.7 cm

## 2021-05-27 NOTE — Progress Notes (Signed)
  Echocardiogram 2D Echocardiogram has been performed.  Matilde Bash 05/27/2021, 1:56 PM

## 2021-05-29 ENCOUNTER — Telehealth: Payer: Self-pay | Admitting: *Deleted

## 2021-05-29 DIAGNOSIS — E782 Mixed hyperlipidemia: Secondary | ICD-10-CM

## 2021-05-29 MED ORDER — ATORVASTATIN CALCIUM 10 MG PO TABS: 10.0000 mg | ORAL_TABLET | Freq: Every day | ORAL | 11 refills | Status: DC

## 2021-05-29 NOTE — Telephone Encounter (Signed)
-----   Message from Charlie Pitter, [place] sent at 05/29/2021  7:38 AM EDT ----- Let's do a trial of atorvastatin '10mg'$  daily with f/u fasting liver/lipid panel in 8 weeks. Let us know if leg pain recurs on this regimen.

## 2021-05-29 NOTE — Telephone Encounter (Signed)
Pt notified and voiced understanding 

## 2021-06-16 ENCOUNTER — Emergency Department
Admission: EM | Admit: 2021-06-16 | Discharge: 2021-06-16 | Disposition: A | Discharge status: Home / Self Care | Payer: Medicare Other | Attending: Emergency Medicine | Admitting: Emergency Medicine

## 2021-06-16 ENCOUNTER — Other Ambulatory Visit: Payer: Self-pay

## 2021-06-16 DIAGNOSIS — I10 Essential (primary) hypertension: Secondary | ICD-10-CM | POA: Insufficient documentation

## 2021-06-16 DIAGNOSIS — Z79899 Other long term (current) drug therapy: Secondary | ICD-10-CM | POA: Diagnosis not present

## 2021-06-16 DIAGNOSIS — R519 Headache, unspecified: Secondary | ICD-10-CM

## 2021-06-16 DIAGNOSIS — Z7982 Long term (current) use of aspirin: Secondary | ICD-10-CM | POA: Diagnosis not present

## 2021-06-16 DIAGNOSIS — E039 Hypothyroidism, unspecified: Secondary | ICD-10-CM | POA: Diagnosis not present

## 2021-06-16 LAB — CBC
HCT: 39.4 % (ref 36.0–46.0)
Hemoglobin: 13.6 g/dL (ref 12.0–15.0)
MCH: 29.2 pg (ref 26.0–34.0)
MCHC: 34.5 g/dL (ref 30.0–36.0)
MCV: 84.7 fL (ref 80.0–100.0)
Platelets: 268 10*3/uL (ref 150–400)
RBC: 4.65 MIL/uL (ref 3.87–5.11)
RDW: 13.9 % (ref 11.5–15.5)
WBC: 5.7 10*3/uL (ref 4.0–10.5)
nRBC: 0 % (ref 0.0–0.2)

## 2021-06-16 LAB — BASIC METABOLIC PANEL
Anion gap: 7 (ref 5–15)
BUN: 16 mg/dL (ref 8–23)
CO2: 25 mmol/L (ref 22–32)
Calcium: 9.4 mg/dL (ref 8.9–10.3)
Chloride: 105 mmol/L (ref 98–111)
Creatinine, Ser: 0.6 mg/dL (ref 0.44–1.00)
GFR, Estimated: >60 mL/min (ref >60)
Glucose, Bld: 109 mg/dL — ABNORMAL HIGH (ref 70–99)
Potassium: 4 mmol/L (ref 3.5–5.1)
Sodium: 137 mmol/L (ref 135–145)

## 2021-06-16 NOTE — ED Provider Notes (Signed)
[facility] Emergency Department Provider Note ____________________________________________   Event Date/Time   First MD Initiated Contact with Patient 06/16/21 1526     (approximate)  I have reviewed the triage vital signs and the nursing notes.  HISTORY  Chief Complaint No chief complaint on file.   HPI Alejandra Singh is a 80 y.o. femalewho presents to the ED for evaluation of hypertension and ear fullness  Chart review indicates hx HTN, HLD, hypothyroidism, GERD and CAD.   Patient presents to the ED, accompanied by her husband, for evaluation of now resolved head pressure in the setting of home hypertension.  She reports the sensation of pressure behind her eyes bilaterally and symmetrically earlier today around lunchtime, so she checked her blood pressure and noted BP around 170/100.  She called for EMS, and her blood pressure had improved by the time they arrived, and she presents to the ED for evaluation.  She reports taking no medications for her headache, but has since resolved and she feels much better now.  She denies any associated syncope or falls, gait changes or weakness.  Denies any vision changes or focal temporal headaches or sharp pains.  Denies neck stiffness or fevers.  Denies recent illnesses.  Denies changes to medication regimen, reports adherence to her antihypertensive regimen.  Past Medical History:  Diagnosis Date   Arthritis    Essential hypertension    FH: colonic polyps    Frequent PVCs    RV outflow tract   GERD (gastroesophageal reflux disease)    Hyperlipidemia    Hypothyroidism    Iron deficiency anemia    Mild mitral regurgitation    Non-obstructive CAD (coronary artery disease)    a. 08/2015 ETT: Ex time 6:18. No ST/T changes;  b. 09/2016 Cath: LM nl, LAD 50d, small bridging, LCX nl, RCA nl, EF 55-65%-->Med Rx. c. Cor CT 03/2018 calcium score 0, 25-50% LAD.   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   Hypothyroidism    Gastroesophageal reflux disease    Coronary artery calcification seen on CAT scan 03/22/2016   Essential hypertension, benign 08/28/2010   Paroxysmal supraventricular tachycardia ([place]) 08/28/2010   Mixed hyperlipidemia 05/14/2009    Past Surgical History:  Procedure Laterality Date   ANKLE SURGERY Left    Fracture   CARDIAC CATHETERIZATION N/A 09/22/2016   Procedure: Left Heart Cath and Coronary Angiography;  Surgeon: Leonie Man, MD;  Location: [place] [facility];  Service: Cardiovascular;  Laterality: N/A;   COLONOSCOPY WITH PROPOFOL N/A 07/05/2015   Procedure: COLONOSCOPY WITH PROPOFOL;  Surgeon: Manya Silvas, MD;  Location: [facility] ENDOSCOPY;  Service: Endoscopy;  Laterality: N/A;   CYSTOCELE REPAIR N/A 04/29/2020   Procedure: ANTERIOR REPAIR (CYSTOCELE);  Surgeon: Schermerhorn, Gwen Her, MD;  Location: [facility] ORS;  Service: Gynecology;  Laterality: N/A;   DILATION AND CURETTAGE OF UTERUS     ESOPHAGOGASTRODUODENOSCOPY (EGD) WITH PROPOFOL N/A 03/19/2016   Procedure: ESOPHAGOGASTRODUODENOSCOPY (EGD) WITH PROPOFOL;  Surgeon: Manya Silvas, MD;  Location: [facility] ENDOSCOPY;  Service: Endoscopy;  Laterality: N/A;   EYE SURGERY Right    cataract   TONSILLECTOMY     VAGINAL HYSTERECTOMY N/A 04/29/2020   Procedure: HYSTERECTOMY VAGINAL;  Surgeon: Schermerhorn,  Gwen Her, MD;  Location: [facility] ORS;  Service: Gynecology;  Laterality: N/A;    Prior to Admission medications   Medication Sig Start Date End Date Taking? Authorizing Provider  amLODipine (NORVASC) 2.5 MG tablet Take 1 tablet by mouth once daily 03/11/21   Satira Sark, MD  aspirin EC 81 MG tablet Take 81 mg by mouth daily.    [provider]   atorvastatin (LIPITOR) 10 MG tablet Take 1 tablet (10 mg total) by mouth daily. 05/29/21 08/27/21  Dunn, Nedra Hai, PA-C  esomeprazole (NEXIUM) 40 MG capsule Take 40 mg by mouth daily as needed (reflux).     [provider]  flecainide (TAMBOCOR) 50 MG tablet Take 1 tablet by mouth twice daily 05/19/21   Satira Sark, MD  hydrOXYzine (ATARAX/VISTARIL) 25 MG tablet SMARTSIG:0.5-1 Pill By Mouth Twice Daily 09/03/20   [provider]  levothyroxine (SYNTHROID, LEVOTHROID) 50 MCG tablet Take 50 mcg by mouth daily before breakfast.    [provider]  Multiple Vitamins-Minerals (MULTIVITAL) tablet Take 1 tablet by mouth daily.    [provider]    Allergies Alendronate  Family History  Problem Relation Age of Onset   Stroke Father    Alzheimer's disease Mother    Dementia Mother    Coronary artery disease Neg Hx     Social History Social History   Tobacco Use   Smoking status: Never   Smokeless tobacco: Never  Vaping Use   Vaping Use: Never used  Substance Use Topics   Alcohol use: No   Drug use: No    Review of Systems  Constitutional: No fever/chills Eyes: No visual changes. ENT: No sore throat. Cardiovascular: Denies chest pain. Respiratory: Denies shortness of breath. Gastrointestinal: No abdominal pain.  No nausea, no vomiting.  No diarrhea.  No constipation. Genitourinary: Negative for dysuria. Musculoskeletal: Negative for back pain. Skin: Negative for rash. Neurological: Negative for focal weakness or numbness.  Positive for headache  ____________________________________________   PHYSICAL EXAM:  VITAL SIGNS: Vitals:   06/16/21 1456 06/16/21 1609  BP:  (!) 164/69  Pulse:  (!) 59  Resp:  17  Temp: 98.6 F (37 C)   SpO2:  94%    Constitutional: Alert and oriented. Well appearing and in no acute distress. Stands up independently from a supine position and ambulates with a normal gait. Eyes: Conjunctivae are normal.  PERRL. EOMI. Head: Atraumatic.  No temporal tenderness bilaterally Nose: No congestion/rhinnorhea. Mouth/Throat: Mucous membranes are moist.  Oropharynx non-erythematous. Neck: No stridor. No cervical spine tenderness to palpation. Cardiovascular: Normal rate, regular rhythm. Grossly normal heart sounds.  Good peripheral circulation. Respiratory: Normal respiratory effort.  No retractions. Lungs CTAB. Gastrointestinal: Soft , nondistended, nontender to palpation. No CVA tenderness. Musculoskeletal: No lower extremity tenderness nor edema.  No joint effusions. No signs of acute trauma. Neurologic:  Normal speech and language. No gross focal neurologic deficits are appreciated. No gait instability noted. Cranial nerves II through XII intact 5/5 strength and sensation in all 4 extremities Skin:  Skin is warm, dry and intact. No rash noted. Psychiatric: Mood and affect are normal. Speech and behavior are normal. ____________________________________________   LABS (all labs ordered are listed, but only abnormal results are displayed)  Labs Reviewed  BASIC METABOLIC PANEL - Abnormal; Notable for the following components:      Result Value   Glucose, Bld 109 (*)    All other components within normal limits  CBC   ____________________________________________  12 Lead  EKG   ____________________________________________  RADIOLOGY  ED MD interpretation:    Official radiology report(s): No results found.  ____________________________________________   PROCEDURES and INTERVENTIONS  Procedure(s) performed (including Critical Care):  .1-3 Lead EKG Interpretation  Date/Time: 06/16/2021 4:40 PM Performed by: Vladimir Crofts, MD Authorized by: Vladimir Crofts, MD     Interpretation: normal     ECG rate:  60   ECG rate assessment: normal     Rhythm: sinus rhythm     Ectopy: none     Conduction: normal    Medications - No data to  display  ____________________________________________   MDM / ED COURSE   80 year old woman presents to the ED after a resolved headache, without evidence of acute pathology beyond mild hypertension, and amenable to outpatient management.  Exam is reassuring without evidence of distress, temporal tenderness or pain, focal neurologic deficits, trauma or any acute pathology.  EKG is nonischemic and screening labs are normal.  She is asymptomatic here in the ED.  We discussed the possibility of CT head, but she declines.  Less likely temporal arteritis.  Will discharge with return precautions.  Clinical Course as of 06/16/21 1640  Mon Jun 16, 2021  1628 We discussed the possibility of intracranial hemorrhage, though unlikely, and I offered a CT scan of her head to assess for this.  She reports that she feels fine and would prefer just to go home.  She reports that she is happy to return if her symptoms were to worsen.  We discussed further return precautions. [DS]    Clinical Course User Index [DS] Vladimir Crofts, MD    ____________________________________________   FINAL CLINICAL IMPRESSION(S) / ED DIAGNOSES  Final diagnoses:  Primary hypertension  Pressure in head     ED Discharge Orders     None        Anaysia Germer Tamala Julian   Note:  This document was prepared using Dragon voice recognition software and may include unintentional dictation errors.    Vladimir Crofts, MD 06/16/21 [phone]

## 2021-06-16 NOTE — ED Triage Notes (Addendum)
Pt to ED [facility] from home, states left ear feels full/ whole head felt tingling earlier. Denies cp, dizziness, headache, shob at this time EMS reports HTN enroute, takes bp meds  20g to Right [facility]

## 2021-06-16 NOTE — Discharge Instructions (Addendum)
As we discussed, please keep track of your blood pressures at home and if they remain high, discussed with your PCP or cardiologist about going up on your blood pressure medications.  If you develop any further worsening headaches, headaches with passing out, please return to the ED.  Use Tylenol for pain and fevers.  Up to 1000 mg per dose, up to 4 times per day.  Do not take more than 4000 mg of Tylenol/acetaminophen within 24 hours.Marland Kitchen

## 2021-06-21 ENCOUNTER — Telehealth: Payer: Self-pay | Admitting: Home Health

## 2021-06-21 NOTE — Telephone Encounter (Signed)
Patient called after-hours line reporting her blood pressure is elevated, called back at [phone], spoke directly to the patient, who reports her blood pressure was 180/110 on 8/29 where she went to the ER, blood pressure later normalized and she was discharged to home and advised to call cardiology office.  Her blood pressure has been doing okay for the next couple days, she noted elevated blood pressure again this morning was 160/95, she had some mild headache and neck discomfort, denies any chest pain, exertional angina, shortness of breath, dizziness, syncope.  Her repeat blood pressure currently is 133/72 and no longer having symptoms.  She has been trying to keep a log of blood pressure readings, states over the past week, blood pressure is mostly controlled with systolic [id number].  As patient is currently asymptomatic normalized blood pressure, advised patient to keep track of her blood pressure, bring logs to upcoming appointment.  Unable to find a early appointment for patient at [facility] office, patient is agreeable to go to [place] office, and arranged on 07/01/2021 at 9 AM, if blood pressure trends appears to be elevated, may potentially uptitrating amlodipine.  Patient if blood pressure is above 180/110 with similar headache symptoms, may take additional amlodipine 2.5 mg.  Patient is agreeable with above plan.  All question answered to satisfaction.

## 2021-06-25 ENCOUNTER — Ambulatory Visit
Admission: RE | Admit: 2021-06-25 | Discharge: 2021-06-25 | Disposition: A | Discharge status: Home / Self Care | Payer: Medicare Other | Source: Ambulatory Visit | Attending: Family Medicine | Admitting: Family Medicine

## 2021-06-25 ENCOUNTER — Other Ambulatory Visit: Payer: Self-pay

## 2021-06-25 DIAGNOSIS — Z1231 Encounter for screening mammogram for malignant neoplasm of breast: Secondary | ICD-10-CM | POA: Diagnosis not present

## 2021-06-26 ENCOUNTER — Other Ambulatory Visit (HOSPITAL_COMMUNITY)
Admission: RE | Admit: 2021-06-26 | Discharge: 2021-06-26 | Disposition: A | Discharge status: Home / Self Care | Payer: Medicare Other | Source: Ambulatory Visit | Attending: Physician Assistant | Admitting: Physician Assistant

## 2021-06-26 ENCOUNTER — Telehealth: Payer: Self-pay | Admitting: Physician Assistant

## 2021-06-26 DIAGNOSIS — E782 Mixed hyperlipidemia: Secondary | ICD-10-CM | POA: Insufficient documentation

## 2021-06-26 DIAGNOSIS — Z79899 Other long term (current) drug therapy: Secondary | ICD-10-CM | POA: Diagnosis present

## 2021-06-26 DIAGNOSIS — M791 Myalgia, unspecified site: Secondary | ICD-10-CM

## 2021-06-26 LAB — COMPREHENSIVE METABOLIC PANEL
ALT: 19 U/L (ref 0–44)
AST: 18 U/L (ref 15–41)
Albumin: 3.9 g/dL (ref 3.5–5.0)
Alkaline Phosphatase: 54 U/L (ref 38–126)
Anion gap: 4 — ABNORMAL LOW (ref 5–15)
BUN: 12 mg/dL (ref 8–23)
CO2: 24 mmol/L (ref 22–32)
Calcium: 8.7 mg/dL — ABNORMAL LOW (ref 8.9–10.3)
Chloride: 107 mmol/L (ref 98–111)
Creatinine, Ser: 0.58 mg/dL (ref 0.44–1.00)
GFR, Estimated: >60 mL/min (ref >60)
Glucose, Bld: 94 mg/dL (ref 70–99)
Potassium: 3.8 mmol/L (ref 3.5–5.1)
Sodium: 135 mmol/L (ref 135–145)
Total Bilirubin: 0.4 mg/dL (ref 0.3–1.2)
Total Protein: 6.5 g/dL (ref 6.5–8.1)

## 2021-06-26 LAB — LIPID PANEL
Cholesterol: 146 mg/dL (ref 0–200)
HDL: 63 mg/dL (ref >40)
LDL Cholesterol: 66 mg/dL (ref 0–99)
Total CHOL/HDL Ratio: 2.3 RATIO
Triglycerides: 83 mg/dL (ref <150)
VLDL: 17 mg/dL (ref 0–40)

## 2021-06-26 LAB — CK: Total CK: 153 U/L (ref 38–234)

## 2021-06-26 NOTE — Telephone Encounter (Signed)
New message     Patient wants to know if she still needs to have her blood work done for her lipid panel?  Patient stopped taking the medication Dayna put her on because it made her legs hurt.

## 2021-06-26 NOTE — Telephone Encounter (Signed)
Reports having severe leg pain with taking atorvastatin and stopped taking it Advised that lipid/liver panel isn't needed since she wasn't able tolerate atorvastatin Advised that message would be sent to provider

## 2021-06-26 NOTE — Telephone Encounter (Signed)
Let's bring her in for a CK level. Agree with stopping the atorvastatin. I would also advised seeing primary care for her leg pain like we previously talked about. Please bring her in for fasting lipid profile and CMET in 8 weeks so we can see where her cholesterol settles. At that time if LDL is over goal, we can revisit alternative therapies. Thanks!

## 2021-06-26 NOTE — Telephone Encounter (Signed)
Patient informed and verbalized understanding of plan. Lab orders faxed to [facility]

## 2021-07-01 ENCOUNTER — Telehealth: Payer: Self-pay

## 2021-07-01 ENCOUNTER — Ambulatory Visit: Payer: Medicare Other | Admitting: Family Medicine

## 2021-07-01 DIAGNOSIS — E782 Mixed hyperlipidemia: Secondary | ICD-10-CM

## 2021-07-01 NOTE — Telephone Encounter (Signed)
-----   Message from Merlene Laughter, RN sent at 07/01/2021 10:54 AM EDT -----   ----- Message ----- From: Charlie Pitter, PA-C Sent: 06/26/2021   5:07 PM EDT To: Merlene Laughter, RN  (Since she had CMET with this labwork, Ok to only get lipid profile for the 8 week labs)

## 2021-07-01 NOTE — Telephone Encounter (Signed)
Lab results discussed with patient, will repeat lipids only in 8 weeks

## 2021-07-10 ENCOUNTER — Other Ambulatory Visit: Payer: Self-pay | Admitting: Family Medicine

## 2021-07-10 ENCOUNTER — Other Ambulatory Visit (HOSPITAL_BASED_OUTPATIENT_CLINIC_OR_DEPARTMENT_OTHER): Payer: Self-pay | Admitting: Family Medicine

## 2021-07-10 DIAGNOSIS — G4489 Other headache syndrome: Secondary | ICD-10-CM

## 2021-07-15 ENCOUNTER — Other Ambulatory Visit: Payer: Self-pay

## 2021-07-15 ENCOUNTER — Other Ambulatory Visit (HOSPITAL_COMMUNITY)
Admission: RE | Admit: 2021-07-15 | Discharge: 2021-07-15 | Disposition: A | Discharge status: Home / Self Care | Payer: Medicare Other | Source: Ambulatory Visit | Attending: Physician Assistant | Admitting: Physician Assistant

## 2021-07-15 DIAGNOSIS — E782 Mixed hyperlipidemia: Secondary | ICD-10-CM | POA: Diagnosis present

## 2021-07-15 DIAGNOSIS — G4489 Other headache syndrome: Secondary | ICD-10-CM | POA: Insufficient documentation

## 2021-07-15 LAB — LIPID PANEL
Cholesterol: 230 mg/dL — ABNORMAL HIGH (ref 0–200)
HDL: 62 mg/dL (ref >40)
LDL Cholesterol: 124 mg/dL — ABNORMAL HIGH (ref 0–99)
Total CHOL/HDL Ratio: 3.7 RATIO
Triglycerides: 221 mg/dL — ABNORMAL HIGH (ref <150)
VLDL: 44 mg/dL — ABNORMAL HIGH (ref 0–40)

## 2021-07-17 ENCOUNTER — Telehealth: Payer: Self-pay | Admitting: *Deleted

## 2021-07-17 DIAGNOSIS — E782 Mixed hyperlipidemia: Secondary | ICD-10-CM

## 2021-07-17 NOTE — Telephone Encounter (Signed)
Pt notified and order placed for the lipid clinic.

## 2021-07-17 NOTE — Telephone Encounter (Signed)
-----   Message from Charlie Pitter, [place] sent at 07/15/2021 10:55 AM EDT ----- Please let patient know cholesterol has crept up (as expected) off statin medicines. At this point she has tried simvastatin, atorvastatin and rosuvastatin without tolerating this. How is leg pain off statin? If willing, could try a trial of pravastatin 20mg  daily with f/u liver/lipid profile in 6 weeks. If she wishes to explore non statin options can refer to lipid clinic.

## 2021-08-01 ENCOUNTER — Encounter: Payer: Self-pay | Admitting: *Deleted

## 2021-08-04 ENCOUNTER — Ambulatory Visit: Payer: Medicare Other | Admitting: Anesthesiology

## 2021-08-04 ENCOUNTER — Ambulatory Visit
Admission: RE | Admit: 2021-08-04 | Discharge: 2021-08-04 | Disposition: A | Discharge status: Home / Self Care | Payer: Medicare Other | Source: Ambulatory Visit | Attending: Gastroenterology | Admitting: Gastroenterology

## 2021-08-04 ENCOUNTER — Other Ambulatory Visit: Payer: Self-pay

## 2021-08-04 ENCOUNTER — Encounter
Admission: RE | Disposition: A | Discharge status: Home / Self Care | Payer: Self-pay | Source: Ambulatory Visit | Attending: Gastroenterology

## 2021-08-04 ENCOUNTER — Encounter: Payer: Self-pay | Admitting: *Deleted

## 2021-08-04 DIAGNOSIS — Z79899 Other long term (current) drug therapy: Secondary | ICD-10-CM | POA: Diagnosis not present

## 2021-08-04 DIAGNOSIS — K219 Gastro-esophageal reflux disease without esophagitis: Secondary | ICD-10-CM | POA: Diagnosis not present

## 2021-08-04 DIAGNOSIS — I10 Essential (primary) hypertension: Secondary | ICD-10-CM | POA: Diagnosis not present

## 2021-08-04 DIAGNOSIS — K5730 Diverticulosis of large intestine without perforation or abscess without bleeding: Secondary | ICD-10-CM | POA: Insufficient documentation

## 2021-08-04 DIAGNOSIS — Z8371 Family history of colonic polyps: Secondary | ICD-10-CM | POA: Insufficient documentation

## 2021-08-04 DIAGNOSIS — Z86010 Personal history of colonic polyps: Secondary | ICD-10-CM | POA: Insufficient documentation

## 2021-08-04 DIAGNOSIS — Z1211 Encounter for screening for malignant neoplasm of colon: Secondary | ICD-10-CM | POA: Diagnosis present

## 2021-08-04 DIAGNOSIS — Z800 Family history of malignant neoplasm of digestive organs: Secondary | ICD-10-CM | POA: Diagnosis not present

## 2021-08-04 DIAGNOSIS — Z7982 Long term (current) use of aspirin: Secondary | ICD-10-CM | POA: Diagnosis not present

## 2021-08-04 DIAGNOSIS — E785 Hyperlipidemia, unspecified: Secondary | ICD-10-CM | POA: Diagnosis not present

## 2021-08-04 DIAGNOSIS — K640 First degree hemorrhoids: Secondary | ICD-10-CM | POA: Diagnosis not present

## 2021-08-04 DIAGNOSIS — I2510 Atherosclerotic heart disease of native coronary artery without angina pectoris: Secondary | ICD-10-CM | POA: Insufficient documentation

## 2021-08-04 DIAGNOSIS — K5900 Constipation, unspecified: Secondary | ICD-10-CM | POA: Insufficient documentation

## 2021-08-04 DIAGNOSIS — E039 Hypothyroidism, unspecified: Secondary | ICD-10-CM | POA: Insufficient documentation

## 2021-08-04 DIAGNOSIS — Z79890 Hormone replacement therapy (postmenopausal): Secondary | ICD-10-CM | POA: Diagnosis not present

## 2021-08-04 HISTORY — PX: COLONOSCOPY: SHX5424

## 2021-08-04 SURGERY — COLONOSCOPY
Anesthesia: General

## 2021-08-04 MED ORDER — SODIUM CHLORIDE 0.9 % IV SOLN: INTRAVENOUS | Status: DC

## 2021-08-04 MED ORDER — PROPOFOL 10 MG/ML IV BOLUS
INTRAVENOUS | Status: DC | PRN
Start: 2021-08-04 — End: 2021-08-04
  Administered 2021-08-04: 60 mg via INTRAVENOUS

## 2021-08-04 MED ORDER — PROPOFOL 500 MG/50ML IV EMUL
INTRAVENOUS | Status: DC | PRN
  Administered 2021-08-04: 140 ug/kg/min via INTRAVENOUS

## 2021-08-04 NOTE — Anesthesia Preprocedure Evaluation (Signed)
Anesthesia Evaluation  Patient identified by MRN, date of birth, ID band Patient awake    Reviewed: Allergy & Precautions, H&P , NPO status , Patient's Chart, lab work & pertinent test results, reviewed documented beta blocker date and time   Airway Mallampati: II   Neck ROM: full    Dental  (+) Teeth Intact   Pulmonary neg pulmonary ROS,    Pulmonary exam normal        Cardiovascular hypertension, Pt. on medications + angina with exertion + CAD  Normal cardiovascular exam+ dysrhythmias (PSVT)  Rhythm:regular Rate:Normal     Neuro/Psych negative neurological ROS  negative psych ROS   GI/Hepatic negative GI ROS, Neg liver ROS, GERD  Medicated,  Endo/Other  negative endocrine ROSHypothyroidism   Renal/GU negative Renal ROS  Female GU complaint     Musculoskeletal  (+) Arthritis , Osteoarthritis,    Abdominal   Peds negative pediatric ROS (+)  Hematology negative hematology ROS (+) anemia ,   Anesthesia Other Findings Arthritis    Essential hypertension    FH: colonic polyps    Frequent PVCs  RV outflow tract  GERD (gastroesophageal reflux disease)   Hyperlipidemia    Hypothyroidism    Iron deficiency anemia    Mild mitral regurgitation    Non-obstructive CAD (coronary artery disease)       Reproductive/Obstetrics                             Anesthesia Physical  Anesthesia Plan  ASA: 3  Anesthesia Plan: General   Post-op Pain Management:    Induction: Intravenous  PONV Risk Score and Plan: 3 and Propofol infusion and TIVA  Airway Management Planned: Natural Airway and Nasal Cannula  Additional Equipment:   Intra-op Plan:   Post-operative Plan:   Informed Consent: I have reviewed the patients History and Physical, chart, labs and discussed the procedure including the risks, benefits and alternatives for the proposed anesthesia with the patient or authorized  representative who has indicated his/her understanding and acceptance.       Plan Discussed with: CRNA, Anesthesiologist and Surgeon  Anesthesia Plan Comments:         Anesthesia Quick Evaluation

## 2021-08-04 NOTE — Op Note (Signed)
[facility] Gastroenterology Patient Name: Alejandra Singh Procedure Date: 08/04/2021 11:04 AM MRN: [id number] Account #: [id number] Date of Birth: Sep 20, 1941 Admit Type: Outpatient Age: 80 Room: [facility] ENDO ROOM 2 Gender: Female Note Status: Finalized Instrument Name: Jasper Riling [id number] Procedure:             Colonoscopy Indications:           Screening in patient at increased risk: Family history                         of 1st-degree relative with colorectal cancer before                         age 73 years, High risk colon cancer surveillance:                         Personal history of colonic polyps Providers:             Rueben Bash, DO Referring MD:          [place], MD (Referring MD) Medicines:             Monitored Anesthesia Care Complications:         No immediate complications. Estimated blood loss: None. Procedure:             Pre-Anesthesia Assessment:                        - Prior to the procedure, a History and Physical was                         performed, and patient medications and allergies were                         reviewed. The patient is competent. The risks and                         benefits of the procedure and the sedation options and                         risks were discussed with the patient. All questions                         were answered and informed consent was obtained.                         Patient identification and proposed procedure were                         verified by the physician, the nurse, the anesthetist                         and the technician in the endoscopy suite. Mental                         Status Examination: alert and oriented. Airway                         Examination: normal oropharyngeal airway and neck  mobility. Respiratory Examination: clear to                         auscultation. CV Examination: RRR, no murmurs, no S3                          or S4. Prophylactic Antibiotics: The patient does not                         require prophylactic antibiotics. Prior                         Anticoagulants: The patient has taken no previous                         anticoagulant or antiplatelet agents. ASA Grade                         Assessment: III - A patient with severe systemic                         disease. After reviewing the risks and benefits, the                         patient was deemed in satisfactory condition to                         undergo the procedure. The anesthesia plan was to use                         monitored anesthesia care (MAC). Immediately prior to                         administration of medications, the patient was                         re-assessed for adequacy to receive sedatives. The                         heart rate, respiratory rate, oxygen saturations,                         blood pressure, adequacy of pulmonary ventilation, and                         response to care were monitored throughout the                         procedure. The physical status of the patient was                         re-assessed after the procedure.                        After obtaining informed consent, the colonoscope was                         passed under direct vision. Throughout the procedure,  the patient's blood pressure, pulse, and oxygen                         saturations were monitored continuously. The                         Colonoscope was introduced through the anus and                         advanced to the the terminal ileum, with                         identification of the appendiceal orifice and IC                         valve. The colonoscopy was performed without                         difficulty. The patient tolerated the procedure well.                         The quality of the bowel preparation was evaluated                         using  the BBPS [facility] Bowel Preparation Scale) with                         scores of: Right Colon = 3, Transverse Colon = 3 and                         Left Colon = 3 (entire mucosa seen well with no                         residual staining, small fragments of stool or opaque                         liquid). The total BBPS score equals 9. The terminal                         ileum, ileocecal valve, appendiceal orifice, and                         rectum were photographed. Findings:      The perianal and digital rectal examinations were normal. Pertinent       negatives include normal sphincter tone.      The terminal ileum appeared normal.      Multiple medium-mouthed diverticula were found in the left colon.       Estimated blood loss: none.      Non-bleeding internal hemorrhoids were found during retroflexion. The       hemorrhoids were Grade I (internal hemorrhoids that do not prolapse).       Estimated blood loss: none.      The exam was otherwise without abnormality on direct and retroflexion       views. Impression:            - The examined portion of the ileum was normal.                        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                        - Non-bleeding internal hemorrhoids.                        - The examination was otherwise normal on direct and                         retroflexion views.                        - No specimens collected. Recommendation:        - Discharge patient to home.                        - Resume previous diet.                        - Continue present medications.                        - Repeat colonoscopy in 5 years for surveillance or                         can discontinue as the patient will have aged out.                         Defer to shared decision making between patient and                         provider                        - Return to referring physician as previously                         scheduled. Procedure  Code(s):     --- Professional ---                        U3845, Colorectal cancer screening; colonoscopy on                         individual at high risk Diagnosis Code(s):     --- Professional ---                        Z80.0, Family history of malignant neoplasm of                         digestive organs                        Z86.010, Personal history of colonic polyps                        K64.0, First degree hemorrhoids                        K57.30, Diverticulosis of large intestine without  perforation or abscess without bleeding CPT copyright 2019 American Medical Association. All rights reserved. The codes documented in this report are preliminary and upon coder review may  be revised to meet current compliance requirements. Attending Participation:      I personally performed the entire procedure. Volney American, DO Annamaria Helling DO, DO 08/04/2021 12:40:01 PM This report has been signed electronically. Number of Addenda: 0 Note Initiated On: 08/04/2021 11:04 AM Scope Withdrawal Time: 0 hours 9 minutes 42 seconds  Total Procedure Duration: 0 hours 20 minutes 4 seconds  Estimated Blood Loss:  Estimated blood loss: none.      [facility]

## 2021-08-04 NOTE — Interval H&P Note (Signed)
History and Physical Interval Note: Preprocedure H&P from 08/04/21  was reviewed and there was no interval change after seeing and examining the patient.  Written consent was obtained from the patient after discussion of risks, benefits, and alternatives. Patient has consented to proceed with Colonoscopy with possible intervention    08/04/2021 12:11 PM  Alejandra Singh  has presented today for surgery, with the diagnosis of (Z86.010) PERSONAL HISTORY OF COLONIC POLYPS.  The various methods of treatment have been discussed with the patient and family. After consideration of risks, benefits and other options for treatment, the patient has consented to  Procedure(s): COLONOSCOPY (N/A) as a surgical intervention.  The patient's history has been reviewed, patient examined, no change in status, stable for surgery.  I have reviewed the patient's chart and labs.  Questions were answered to the patient's satisfaction.     Alejandra Singh

## 2021-08-04 NOTE — Transfer of Care (Signed)
Immediate Anesthesia Transfer of Care Note  Patient: Alejandra Singh  Procedure(s) Performed: COLONOSCOPY  Patient Location: PACU  Anesthesia Type:General  Level of Consciousness: awake, alert  and oriented  Airway & Oxygen Therapy: Patient Spontanous Breathing  Post-op Assessment: Report given to RN and Post -op Vital signs reviewed and stable  Post vital signs: Reviewed and stable  Last Vitals:  Vitals Value Taken Time  BP    Temp    Pulse 59 08/04/21 1238  Resp 13 08/04/21 1238  SpO2 97 % 08/04/21 1238  Vitals shown include unvalidated device data.  Last Pain:  Vitals:   08/04/21 1118  TempSrc: Temporal  PainSc: 0-No pain         Complications: No notable events documented.

## 2021-08-04 NOTE — Anesthesia Postprocedure Evaluation (Signed)
Anesthesia Post Note  Patient: Alejandra Singh  Procedure(s) Performed: COLONOSCOPY  Patient location during evaluation: Phase II Anesthesia Type: General Level of consciousness: awake and alert, awake and oriented Pain management: pain level controlled Vital Signs Assessment: post-procedure vital signs reviewed and stable Respiratory status: spontaneous breathing, nonlabored ventilation and respiratory function stable Cardiovascular status: blood pressure returned to baseline and stable Postop Assessment: no apparent nausea or vomiting Anesthetic complications: no   No notable events documented.   Last Vitals:  Vitals:   08/04/21 1118 08/04/21 1239  BP: (!) 148/76   Pulse: 76   Resp: 16   Temp: (!) 36.4 C 36.8 C  SpO2: 98%     Last Pain:  Vitals:   08/04/21 1259  TempSrc:   PainSc: 0-No pain                 Phill Mutter

## 2021-08-04 NOTE — H&P (Signed)
[facility] Pre-Procedure H&P   Patient ID: Alejandra Singh is a 80 y.o. female.  Gastroenterology Provider: Annamaria Helling, DO  Referring Provider: Laurine Blazer, PA PCP: [facility]  Date: 08/04/2021  HPI Alejandra Singh is a 80 y.o. female who presents today for Colonoscopy for surveillance colonoscopy with personal h/o polyps and fhx of colorectal cancer (brother 68s) and another brother with colon polyps.  Has dealt with some constipation as of late. No blood in stool, diarrhea or abdominal pain. Weight and appetite stable. No oac, anti plt or nsaids.  06/2015 colonoscopy with one small ta in ascendign colon s/p polypectomy and clip; diverticulosis and IH noted.  No other acute gi complaints.  Past Medical History:  Diagnosis Date   Arthritis    Essential hypertension    FH: colonic polyps    Frequent PVCs    RV outflow tract   GERD (gastroesophageal reflux disease)    Hyperlipidemia    Hypothyroidism    Iron deficiency anemia    Mild mitral regurgitation    Non-obstructive CAD (coronary artery disease)    a. 08/2015 ETT: Ex time 6:18. No ST/T changes;  b. 09/2016 Cath: LM nl, LAD 50d, small bridging, LCX nl, RCA nl, EF 55-65%-->Med Rx. c. Cor CT 03/2018 calcium score 0, 25-50% LAD.   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    Past Surgical History:  Procedure Laterality Date   ANKLE SURGERY Left    Fracture   CARDIAC CATHETERIZATION N/A 09/22/2016   Procedure: Left Heart Cath and Coronary Angiography;  Surgeon: Leonie Man, MD;  Location: [place] CV LAB;  Service: Cardiovascular;  Laterality: N/A;   COLONOSCOPY WITH PROPOFOL N/A 07/05/2015   Procedure: COLONOSCOPY WITH PROPOFOL;  Surgeon: Manya Silvas, MD;  Location: [facility] ENDOSCOPY;  Service: Endoscopy;  Laterality: N/A;   CYSTOCELE REPAIR N/A 04/29/2020   Procedure: ANTERIOR REPAIR (CYSTOCELE);   Surgeon: Schermerhorn, Gwen Her, MD;  Location: [facility] ORS;  Service: Gynecology;  Laterality: N/A;   DILATION AND CURETTAGE OF UTERUS     ESOPHAGOGASTRODUODENOSCOPY (EGD) WITH PROPOFOL N/A 03/19/2016   Procedure: ESOPHAGOGASTRODUODENOSCOPY (EGD) WITH PROPOFOL;  Surgeon: Manya Silvas, MD;  Location: [facility] ENDOSCOPY;  Service: Endoscopy;  Laterality: N/A;   EYE SURGERY Right    cataract   TONSILLECTOMY     VAGINAL HYSTERECTOMY N/A 04/29/2020   Procedure: HYSTERECTOMY VAGINAL;  Surgeon: Schermerhorn, Gwen Her, MD;  Location: [facility] ORS;  Service: Gynecology;  Laterality: N/A;    Family History colorectal cancer (brother 79s) and another brother with colon polyps. Brother- cirrhosis No other h/o GI disease or malignancy  Review of Systems  Constitutional:  Negative for activity change, appetite change, fatigue, fever and unexpected weight change.  HENT:  Negative for trouble swallowing and voice change.   Respiratory:  Negative for shortness of breath and wheezing.   Cardiovascular:  Negative for chest pain and palpitations.  Gastrointestinal:  Positive for constipation. Negative for abdominal distention, abdominal pain, anal bleeding, blood in stool, diarrhea, nausea, rectal pain and vomiting.  Musculoskeletal:  Negative for arthralgias and myalgias.  Skin:  Negative for color change and pallor.  Neurological:  Negative for dizziness, syncope and weakness.  Psychiatric/Behavioral:  Negative for confusion.   All other systems reviewed and are negative.   Medications No current facility-administered medications on file prior to encounter.   Current Outpatient Medications on File Prior to Encounter  Medication Sig  Dispense Refill   amLODipine (NORVASC) 2.5 MG tablet Take 1 tablet by mouth once daily 90 tablet 2   aspirin EC 81 MG tablet Take 81 mg by mouth daily.     esomeprazole (NEXIUM) 40 MG capsule Take 40 mg by mouth daily as needed (reflux).      Estriol 10 % CREA by Does not  apply route.     flecainide (TAMBOCOR) 50 MG tablet Take 1 tablet by mouth twice daily 180 tablet 0   hydrOXYzine (ATARAX/VISTARIL) 25 MG tablet SMARTSIG:0.5-1 Pill By Mouth Twice Daily     levothyroxine (SYNTHROID, LEVOTHROID) 50 MCG tablet Take 50 mcg by mouth daily before breakfast.     Multiple Vitamins-Minerals (MULTIVITAL) tablet Take 1 tablet by mouth daily.     rosuvastatin (CRESTOR) 10 MG tablet Take 10 mg by mouth daily.     atorvastatin (LIPITOR) 10 MG tablet Take 1 tablet (10 mg total) by mouth daily. (Patient not taking: Reported on 08/04/2021) 30 tablet 11    Pertinent medications related to GI and procedure were reviewed by me with the patient prior to the procedure   Current Facility-Administered Medications:    0.9 %  sodium chloride infusion, , Intravenous, Continuous, Annamaria Helling, DO, Last Rate: 20 mL/hr at 08/04/21 1131, New Bag at 08/04/21 1131  sodium chloride 20 mL/hr at 08/04/21 1131       Allergies  Allergen Reactions   Alendronate Other (See Comments)    Can't recall reaction   Allergies were reviewed by me prior to the procedure  Objective    Vitals:   08/04/21 1118  BP: (!) 148/76  Pulse: 76  Resp: 16  Temp: (!) 97.5 F (36.4 C)  TempSrc: Temporal  SpO2: 98%  Weight: 63.5 kg  Height: 5\' 5"  (1.651 m)     Physical Exam Vitals reviewed.  Constitutional:      General: She is not in acute distress.    Appearance: Normal appearance. She is normal weight. She is not ill-appearing, toxic-appearing or diaphoretic.  HENT:     Head: Normocephalic and atraumatic.     Nose: Nose normal.     Mouth/Throat:     Mouth: Mucous membranes are moist.     Pharynx: Oropharynx is clear.  Eyes:     General: No scleral icterus.    Extraocular Movements: Extraocular movements intact.  Cardiovascular:     Rate and Rhythm: Normal rate and regular rhythm.     Heart sounds: Normal heart sounds. No murmur heard.   No friction rub. No gallop.   Pulmonary:     Effort: Pulmonary effort is normal. No respiratory distress.     Breath sounds: Normal breath sounds. No wheezing, rhonchi or rales.  Abdominal:     General: Abdomen is flat. Bowel sounds are normal. There is no distension.     Palpations: Abdomen is soft.     Tenderness: There is no abdominal tenderness. There is no guarding or rebound.  Musculoskeletal:     Cervical back: Neck supple.     Right lower leg: No edema.     Left lower leg: No edema.  Skin:    General: Skin is warm and dry.     Coloration: Skin is not jaundiced or pale.  Neurological:     General: No focal deficit present.     Mental Status: She is alert and oriented to person, place, and time. Mental status is at baseline.  Psychiatric:  Mood and Affect: Mood normal.        Behavior: Behavior normal.        Thought Content: Thought content normal.        Judgment: Judgment normal.     Assessment:  Ms. Alejandra Singh is a 80 y.o. female  who presents today for Colonoscopy for surveillance colonoscopy with personal h/o polyps and fhx of colorectal cancer (brother 1s) and another brother with colon polyps.  Plan:  Colonoscopy with possible intervention today  Colonoscopy with possible biopsy, control of bleeding, polypectomy, and interventions as necessary has been discussed with the patient/patient representative. Informed consent was obtained from the patient/patient representative after explaining the indication, nature, and risks of the procedure including but not limited to death, bleeding, perforation, missed neoplasm/lesions, cardiorespiratory compromise, and reaction to medications. Opportunity for questions was given and appropriate answers were provided. Patient/patient representative has verbalized understanding is amenable to undergoing the procedure.   Annamaria Helling, DO  [facility]astro[facility]  Portions of the record may have been created with voice recognition software.  Occasional wrong-word or 'sound-a-like' substitutions may have occurred due to the inherent limitations of voice recognition software.  Read the chart carefully and recognize, using context, where substitutions may have occurred.

## 2021-08-05 ENCOUNTER — Encounter: Payer: Self-pay | Admitting: Gastroenterology

## 2021-08-07 ENCOUNTER — Telehealth: Payer: Self-pay | Admitting: Physician Assistant

## 2021-08-07 NOTE — Telephone Encounter (Signed)
New message    Patient wants to go back on her old cholesterol medication for now, she does not have time to go to [facility] to see the Lipid clinic.

## 2021-08-07 NOTE — Telephone Encounter (Signed)
Pt agreed to come to the [place] office to see Dr. Geralyn Corwin for [facility] on 11/23.

## 2021-08-07 NOTE — Telephone Encounter (Signed)
Pt stated that she cannot go to [place] as often as needed for Lipid Clinic. Pt states that she is having to see an orthopedist for her back and her husband has many appointments that she needs to be present at as well. Pt is asking Melina Copa, PA-C to consider putting her back on Rosuvastatin (Crestor) as this medication hurt the least in terms of side effects.  Please advise.

## 2021-08-15 ENCOUNTER — Other Ambulatory Visit: Payer: Self-pay | Admitting: Cardiology

## 2021-08-18 ENCOUNTER — Other Ambulatory Visit: Payer: Self-pay | Admitting: Cardiology

## 2021-08-19 ENCOUNTER — Ambulatory Visit: Payer: Medicare Other

## 2021-08-27 ENCOUNTER — Other Ambulatory Visit: Payer: Self-pay

## 2021-08-27 ENCOUNTER — Ambulatory Visit
Admission: RE | Admit: 2021-08-27 | Discharge: 2021-08-27 | Disposition: A | Discharge status: Home / Self Care | Payer: Medicare Other | Source: Ambulatory Visit | Attending: Family Medicine | Admitting: Family Medicine

## 2021-08-27 DIAGNOSIS — G4489 Other headache syndrome: Secondary | ICD-10-CM | POA: Insufficient documentation

## 2021-09-10 ENCOUNTER — Encounter: Payer: Self-pay | Admitting: Internal Medicine

## 2021-09-10 ENCOUNTER — Other Ambulatory Visit: Payer: Self-pay

## 2021-09-10 ENCOUNTER — Ambulatory Visit (INDEPENDENT_AMBULATORY_CARE_PROVIDER_SITE_OTHER): Payer: Medicare Other | Admitting: Internal Medicine

## 2021-09-10 VITALS — BP 142/68 | HR 66 | Ht 65.0 in | Wt 144.0 lb

## 2021-09-10 DIAGNOSIS — I493 Ventricular premature depolarization: Secondary | ICD-10-CM

## 2021-09-10 DIAGNOSIS — M791 Myalgia, unspecified site: Secondary | ICD-10-CM

## 2021-09-10 DIAGNOSIS — I2510 Atherosclerotic heart disease of native coronary artery without angina pectoris: Secondary | ICD-10-CM

## 2021-09-10 DIAGNOSIS — E785 Hyperlipidemia, unspecified: Secondary | ICD-10-CM | POA: Diagnosis not present

## 2021-09-10 DIAGNOSIS — I10 Essential (primary) hypertension: Secondary | ICD-10-CM | POA: Diagnosis not present

## 2021-09-10 MED ORDER — ROSUVASTATIN CALCIUM 5 MG PO TABS: 5.0000 mg | ORAL_TABLET | Freq: Every day | ORAL | 3 refills | Status: DC

## 2021-09-10 NOTE — Progress Notes (Signed)
LIPID CLINIC CONSULT NOTE  Chief Complaint:  Manage cholesterol, leg pain  Primary Care Physician: [facility], [place]  Primary Cardiologist:  Rozann Lesches, MD  HPI:  Alejandra Singh is a 80 y.o. female who is being seen today for the evaluation of leg pain at the request of [facility], [facility] [facility]*. This is a pleasant 80 year old female patient of Dr. Domenic Polite with a history of coronary artery disease, nonobstructive, hypertension and dyslipidemia as well as frequent PVCs which should be mitigated by flecainide.  Overall she says she feels well.  Unfortunately she is struggled on and off with leg pain.  This is previously been attributed to her statin medications.  She has been on simvastatin, rosuvastatin 10 mg daily and more recently atorvastatin 10 mg daily.  She had some severe leg pain and a CK was checked which was normal.  On medication several months ago her total cholesterol was 146, triglycerides 83, HDL 63 and LDL 66.  Unfortunately then she discontinued the medicine and her cholesterol went up accordingly as of September, total cholesterol 230, triglycerides 221, HDL 62 and LDL 124.  She ultimately sought out an orthopedist and had an MRI which indicated no clear reason for her leg pain however after stopping the atorvastatin she notes that her leg pain has not really improved.  She now feels it may not of been related to the statins.  Even prior to discussing alternative she felt like she would like to go back to statins particularly the rosuvastatin which she said was better tolerated.  PMHx:  Past Medical History:  Diagnosis Date   Arthritis    Essential hypertension    FH: colonic polyps    Frequent PVCs    RV outflow tract   GERD (gastroesophageal reflux disease)    Hyperlipidemia    Hypothyroidism    Iron deficiency anemia    Mild mitral regurgitation    Non-obstructive CAD (coronary artery disease)    a. 08/2015 ETT: Ex time 6:18. No ST/T changes;   b. 09/2016 Cath: LM nl, LAD 50d, small bridging, LCX nl, RCA nl, EF 55-65%-->Med Rx. c. Cor CT 03/2018 calcium score 0, 25-50% LAD.   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    Past Surgical History:  Procedure Laterality Date   ANKLE SURGERY Left    Fracture   CARDIAC CATHETERIZATION N/A 09/22/2016   Procedure: Left Heart Cath and Coronary Angiography;  Surgeon: Leonie Man, MD;  Location: [place] CV LAB;  Service: Cardiovascular;  Laterality: N/A;   COLONOSCOPY N/A 08/04/2021   Procedure: COLONOSCOPY;  Surgeon: Annamaria Helling, DO;  Location: [facility] ENDOSCOPY;  Service: Gastroenterology;  Laterality: N/A;   COLONOSCOPY WITH PROPOFOL N/A 07/05/2015   Procedure: COLONOSCOPY WITH PROPOFOL;  Surgeon: Manya Silvas, MD;  Location: [facility] ENDOSCOPY;  Service: Endoscopy;  Laterality: N/A;   CYSTOCELE REPAIR N/A 04/29/2020   Procedure: ANTERIOR REPAIR (CYSTOCELE);  Surgeon: Schermerhorn, Gwen Her, MD;  Location: [facility] ORS;  Service: Gynecology;  Laterality: N/A;   DILATION AND CURETTAGE OF UTERUS     ESOPHAGOGASTRODUODENOSCOPY (EGD) WITH PROPOFOL N/A 03/19/2016   Procedure: ESOPHAGOGASTRODUODENOSCOPY (EGD) WITH PROPOFOL;  Surgeon: Manya Silvas, MD;  Location: [facility] ENDOSCOPY;  Service: Endoscopy;  Laterality: N/A;   EYE SURGERY Right    cataract   TONSILLECTOMY     VAGINAL HYSTERECTOMY N/A 04/29/2020   Procedure: HYSTERECTOMY VAGINAL;  Surgeon: Schermerhorn, Gwen Her, MD;  Location:  [facility] ORS;  Service: Gynecology;  Laterality: N/A;    FAMHx:  Family History  Problem Relation Age of Onset   Alzheimer's disease Mother    Dementia Mother    Stroke Father    Coronary artery disease Neg Hx    Breast cancer Neg Hx     SOCHx:   reports that she has never smoked. She has never used smokeless tobacco. She reports that she does not drink alcohol and does not use drugs.  ALLERGIES:  Allergies  Allergen Reactions    Alendronate Other (See Comments)    Can't recall reaction    ROS: Pertinent items noted in HPI and remainder of comprehensive ROS otherwise negative.  HOME MEDS: Current Outpatient Medications on File Prior to Visit  Medication Sig Dispense Refill   amLODipine (NORVASC) 2.5 MG tablet Take 1 tablet by mouth once daily 90 tablet 2   aspirin EC 81 MG tablet Take 81 mg by mouth daily.     atorvastatin (LIPITOR) 10 MG tablet Take 1 tablet (10 mg total) by mouth daily. (Patient not taking: Reported on 08/04/2021) 30 tablet 11   esomeprazole (NEXIUM) 40 MG capsule Take 40 mg by mouth daily as needed (reflux).      Estriol 10 % CREA by Does not apply route.     flecainide (TAMBOCOR) 50 MG tablet Take 1 tablet by mouth twice daily 180 tablet 0   hydrOXYzine (ATARAX/VISTARIL) 25 MG tablet SMARTSIG:0.5-1 Pill By Mouth Twice Daily     levothyroxine (SYNTHROID, LEVOTHROID) 50 MCG tablet Take 50 mcg by mouth daily before breakfast.     Multiple Vitamins-Minerals (MULTIVITAL) tablet Take 1 tablet by mouth daily.     rosuvastatin (CRESTOR) 10 MG tablet Take 10 mg by mouth daily.     No current facility-administered medications on file prior to visit.    LABS/IMAGING: No results found for this or any previous visit (from the past 48 hour(s)). No results found.  LIPID PANEL:    Component Value Date/Time   CHOL 230 (H) 07/15/2021 0920   TRIG 221 (H) 07/15/2021 0920   HDL 62 07/15/2021 0920   CHOLHDL 3.7 07/15/2021 0920   VLDL 44 (H) 07/15/2021 0920   LDLCALC 124 (H) 07/15/2021 0920    WEIGHTS: Wt Readings from Last 3 Encounters:  08/04/21 140 lb (63.5 kg)  06/16/21 142 lb (64.4 kg)  05/13/21 142 lb (64.4 kg)    VITALS: There were no vitals taken for this visit.  EXAM: Deferred  EKG: Deferred  ASSESSMENT: Dyslipidemia-goal LDL less than 70 Mild to moderate nonobstructive coronary disease Hypothyroidism Hypertension RVOT PVCs-suppressed on flecainide  PLAN: 1.   [name] notes no significant improvement in her leg pain after stopping the atorvastatin which was only 10 mg.  She says she felt a little better on rosuvastatin 10 mg.  I think that was more than sufficient for her and likely we could try a lower dose rosuvastatin 5 mg daily.  This may be the best tolerated and would recommend repeating her lipids in about 3 months.  If she is still not at target could consider adding ezetimibe 10 mg daily.  She is agreeable to this plan.  Would recommend follow-up with her primary cardiologist Dr. Domenic Polite in about 3 to 4 months.  Thanks for allowing me to participate in her care.  Pixie Casino, MD, [facility], [place] Director of the Advanced Lipid Disorders &  Cardiovascular Risk Reduction Clinic  Diplomate of the [employer] of Clinical Lipidology Attending Cardiologist  Direct Dial: [phone]  Fax: [phone]  Website:  [email or web address] 09/10/2021, 8:54 AM

## 2021-09-10 NOTE — Patient Instructions (Signed)
Medication Instructions:  START: TFTDDUK 5mg  ONCE DAILY  *If you need a refill on your cardiac medications before your next appointment, please call your pharmacy*  Lab Work: [place] BLOOD WORK IN 3 MONTHS AT [facility]  If you have labs (blood work) drawn today and your tests are completely normal, you will receive your results only by: [place] (if you have MyChart) OR A paper copy in the mail If you have any lab test that is abnormal or we need to change your treatment, we will call you to review the results.  Follow-Up: At [facility], you and your health needs are our priority.  As part of our continuing mission to provide you with exceptional heart care, we have created designated Provider Care Teams.  These Care Teams include your primary Cardiologist (physician) and Advanced Practice Providers (APPs -  Physician Assistants and Nurse Practitioners) who all work together to provide you with the care you need, when you need it.  We recommend signing up for the patient portal called "MyChart".  Sign up information is provided on this After Visit Summary.  MyChart is used to connect with patients for Virtual Visits (Telemedicine).  Patients are able to view lab/test results, encounter notes, upcoming appointments, etc.  Non-urgent messages can be sent to your provider as well.   To learn more about what you can do with MyChart, go to [email or web address].    Your next appointment:   3 month(s)  The format for your next appointment:   In Person  Provider:   Rozann Lesches, MD

## 2021-12-24 NOTE — Progress Notes (Signed)
? ? ?Cardiology Office Note ? ?Date: 12/25/2021  ? ?ID: Alejandra Singh, DOB 28-Apr-1941, MRN [id number] ? ?PCP:  [facility]  ?Cardiologist:  Rozann Lesches, MD ?Electrophysiologist:  None  ? ?Chief Complaint  ?Patient presents with  ? Cardiac follow-up  ? ? ?History of Present Illness: ?Alejandra Singh is a 81 y.o. female last seen in July 2022 by Ms. Dunn PA-C.  She is here for a routine visit.  Reports very good control of palpitations, no exertional chest pain, no syncope. ? ?She was evaluated by Dr. Debara Pickett in the lipid clinic, has remained on low-dose Crestor 5 mg daily and is due for a follow-up fasting lipid profile.  She is tolerating this well.  Her last LDL was 124 in September 2022. ? ?I reviewed her medications as noted below.  She remains on flecainide. ? ?Past Medical History:  ?Diagnosis Date  ? Arthritis   ? Essential hypertension   ? FH: colonic polyps   ? Frequent PVCs   ? RV outflow tract  ? GERD (gastroesophageal reflux disease)   ? Hyperlipidemia   ? Hypothyroidism   ? Iron deficiency anemia   ? Mild mitral regurgitation   ? Non-obstructive CAD (coronary artery disease)   ? a. 08/2015 ETT: Ex time 6:18. No ST/T changes;  b. 09/2016 Cath: LM nl, LAD 50d, small bridging, LCX nl, RCA nl, EF 55-65%-->Med Rx. c. Cor CT 03/2018 calcium score 0, 25-50% LAD.  ? Osteoporosis   ? PSVT (paroxysmal supraventricular tachycardia) ([place])   ? 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 ? ?Past Surgical History:  ?Procedure Laterality Date  ? ANKLE SURGERY Left   ? Fracture  ? CARDIAC CATHETERIZATION N/A 09/22/2016  ? Procedure: Left Heart Cath and Coronary Angiography;  Surgeon: Leonie Man, MD;  Location: [facility];  Service: Cardiovascular;  Laterality: N/A;  ? COLONOSCOPY N/A 08/04/2021  ? Procedure: COLONOSCOPY;  Surgeon: Annamaria Helling, DO;  Location: [facility] ENDOSCOPY;  Service: Gastroenterology;  Laterality: N/A;  ? COLONOSCOPY WITH PROPOFOL N/A 07/05/2015  ? Procedure: COLONOSCOPY WITH  PROPOFOL;  Surgeon: Manya Silvas, MD;  Location: [facility] ENDOSCOPY;  Service: Endoscopy;  Laterality: N/A;  ? CYSTOCELE REPAIR N/A 04/29/2020  ? Procedure: ANTERIOR REPAIR (CYSTOCELE);  Surgeon: Schermerhorn, Gwen Her, MD;  Location: [facility] ORS;  Service: Gynecology;  Laterality: N/A;  ? DILATION AND CURETTAGE OF UTERUS    ? ESOPHAGOGASTRODUODENOSCOPY (EGD) WITH PROPOFOL N/A 03/19/2016  ? Procedure: ESOPHAGOGASTRODUODENOSCOPY (EGD) WITH PROPOFOL;  Surgeon: Manya Silvas, MD;  Location: [facility] ENDOSCOPY;  Service: Endoscopy;  Laterality: N/A;  ? EYE SURGERY Right   ? cataract  ? TONSILLECTOMY    ? VAGINAL HYSTERECTOMY N/A 04/29/2020  ? Procedure: HYSTERECTOMY VAGINAL;  Surgeon: Schermerhorn, Gwen Her, MD;  Location: [facility] ORS;  Service: Gynecology;  Laterality: N/A;  ? ? ?Current Outpatient Medications  ?Medication Sig Dispense Refill  ? Alpha-Lipoic Acid 200 MG TABS Take 1 tablet by mouth in the morning, at noon, and at bedtime.    ? amLODipine (NORVASC) 2.5 MG tablet Take 1 tablet by mouth once daily 90 tablet 2  ? aspirin EC 81 MG tablet Take 81 mg by mouth daily.    ? esomeprazole (NEXIUM) 40 MG capsule Take 40 mg by mouth daily as needed (reflux).     ? flecainide (TAMBOCOR) 50 MG tablet Take 1 tablet by mouth twice daily 180 tablet 0  ? gabapentin (NEURONTIN) 100 MG capsule Take 1 capsule by mouth in the  morning and at bedtime.    ? levothyroxine (SYNTHROID, LEVOTHROID) 50 MCG tablet Take 50 mcg by mouth daily before breakfast.    ? Multiple Vitamins-Minerals (MULTIVITAL) tablet Take 1 tablet by mouth daily.    ? rosuvastatin (CRESTOR) 5 MG tablet Take 1 tablet (5 mg total) by mouth daily. 90 tablet 3  ? Estriol 10 % CREA by Does not apply route. (Patient not taking: Reported on 09/10/2021)    ? hydrOXYzine (ATARAX/VISTARIL) 25 MG tablet SMARTSIG:0.5-1 Pill By Mouth Twice Daily (Patient not taking: Reported on 09/10/2021)    ? ?No current facility-administered medications for this visit.  ? ?Allergies:   Alendronate  ? ?ROS: Peripheral neuropathy. ? ?Physical Exam: ?VS:  BP (!) 142/80   Pulse 99   Ht '5\' 4"'$  (1.626 m)   Wt 144 lb 6.4 oz (65.5 kg)   SpO2 96%   BMI 24.79 kg/m? , BMI Body mass index is 24.79 kg/m?. ? ?Wt Readings from Last 3 Encounters:  ?12/25/21 144 lb 6.4 oz (65.5 kg)  ?09/10/21 144 lb (65.3 kg)  ?08/04/21 140 lb (63.5 kg)  ?  ?General: Patient appears comfortable at rest. ?HEENT: Conjunctiva and lids normal, wearing a mask. ?Neck: Supple, no elevated JVP or carotid bruits, no thyromegaly. ?Lungs: Clear to auscultation, nonlabored breathing at rest. ?Cardiac: Regular rate and rhythm, no S3 or significant systolic murmur. ?Extremities: No pitting edema. ? ?ECG:  An ECG dated 06/16/2021 was personally reviewed today and demonstrated:  Sinus rhythm with leftward axis. ? ?Recent Labwork: ?05/13/2021: Magnesium 2.4 ?06/16/2021: Hemoglobin 13.6; Platelets 268 ?06/26/2021: ALT 19; AST 18; BUN 12; Creatinine, Ser 0.58; Potassium 3.8; Sodium 135  ?   ?Component Value Date/Time  ? CHOL 230 (H) 07/15/2021 0920  ? TRIG 221 (H) 07/15/2021 0920  ? HDL 62 07/15/2021 0920  ? CHOLHDL 3.7 07/15/2021 0920  ? VLDL 44 (H) 07/15/2021 0920  ? Keller 124 (H) 07/15/2021 0920  ? ? ?Other Studies Reviewed Today: ? ?Echocardiogram 05/27/2021: ? 1. Left ventricular ejection fraction, by estimation, is 60 to 65%. The  ?left ventricle has normal function. The left ventricle has no regional  ?wall motion abnormalities. Left ventricular diastolic parameters were  ?normal.  ? 2. Right ventricular systolic function is normal. The right ventricular  ?size is normal. There is normal pulmonary artery systolic pressure.  ? 3. The mitral valve is normal in structure. Trivial mitral valve  ?regurgitation. No evidence of mitral stenosis.  ? 4. The aortic valve is tricuspid. Aortic valve regurgitation is not  ?visualized. No aortic stenosis is present.  ? 5. The inferior vena cava is normal in size with greater than 50%  ?respiratory  variability, suggesting right atrial pressure of 3 mmHg.  ? ?Assessment and Plan: ? ?1.  History of frequent PVCs from the RV outflow tract, doing very well on flecainide, no palpitations or syncope.  She is not on beta-blocker given history of symptomatic bradycardia.  LVEF 60 to 65%.  Continue observation. ? ?2.  Nonobstructive coronary atherosclerosis by cardiac CTA, no active angina at this time.  She continues on low-dose aspirin as well as Crestor. ? ?3.  Mixed hyperlipidemia, tolerating Crestor 5 mg daily.  Check FLP. ? ?Medication Adjustments/Labs and Tests Ordered: ?Current medicines are reviewed at length with the patient today.  Concerns regarding medicines are outlined above.  ? ?Tests Ordered: ?Orders Placed This Encounter  ?Procedures  ? Lipid Profile  ? ? ?Medication Changes: ?No orders of the defined types were placed in this  encounter. ? ? ?Disposition:  Follow up  6 months. ? ?Signed, ?Satira Sark, MD, [facility] ?12/25/2021 8:49 AM    ?[place] [facility] ?[address] [place],  [address] ?Phone: [phone]; Fax: [phone]  ?

## 2021-12-25 ENCOUNTER — Ambulatory Visit (INDEPENDENT_AMBULATORY_CARE_PROVIDER_SITE_OTHER): Payer: Medicare Other | Admitting: Cardiology

## 2021-12-25 ENCOUNTER — Encounter: Payer: Self-pay | Admitting: Cardiology

## 2021-12-25 VITALS — BP 142/80 | HR 99 | Ht 64.0 in | Wt 144.4 lb

## 2021-12-25 DIAGNOSIS — I2510 Atherosclerotic heart disease of native coronary artery without angina pectoris: Secondary | ICD-10-CM | POA: Diagnosis not present

## 2021-12-25 DIAGNOSIS — E782 Mixed hyperlipidemia: Secondary | ICD-10-CM | POA: Diagnosis not present

## 2021-12-25 DIAGNOSIS — I493 Ventricular premature depolarization: Secondary | ICD-10-CM

## 2021-12-25 NOTE — Patient Instructions (Signed)
Medication Instructions:  ?Your physician recommends that you continue on your current medications as directed. Please refer to the Current Medication list given to you today. ? ? ?Labwork: ?Fasting Lipids ? ?Testing/Procedures: ?none ? ?Follow-Up: ?6 months ? ?Any Other Special Instructions Will Be Listed Below (If Applicable). ? ?If you need a refill on your cardiac medications before your next appointment, please call your pharmacy. ? ?

## 2021-12-29 ENCOUNTER — Other Ambulatory Visit (HOSPITAL_COMMUNITY)
Admission: RE | Admit: 2021-12-29 | Discharge: 2021-12-29 | Disposition: A | Discharge status: Home / Self Care | Payer: Medicare Other | Source: Ambulatory Visit | Attending: Cardiology | Admitting: Cardiology

## 2021-12-29 DIAGNOSIS — E782 Mixed hyperlipidemia: Secondary | ICD-10-CM | POA: Diagnosis present

## 2021-12-29 LAB — LIPID PANEL
Cholesterol: 159 mg/dL (ref 0–200)
HDL: 68 mg/dL (ref >40)
LDL Cholesterol: 70 mg/dL (ref 0–99)
Total CHOL/HDL Ratio: 2.3 RATIO
Triglycerides: 106 mg/dL (ref <150)
VLDL: 21 mg/dL (ref 0–40)

## 2021-12-31 ENCOUNTER — Telehealth: Payer: Self-pay

## 2021-12-31 NOTE — Telephone Encounter (Signed)
-----   Message from Satira Sark, MD sent at 12/30/2021  7:08 PM EDT ----- ?Results reviewed.  Follow-up lipids show improvement in LDL down to 70 from 124 on Crestor.  Continue current therapy. ?

## 2021-12-31 NOTE — Telephone Encounter (Signed)
Patient notified and verbalized understanding. Pt had no questions or concerns at this time 

## 2022-02-11 ENCOUNTER — Other Ambulatory Visit: Payer: Self-pay | Admitting: Cardiology

## 2022-05-14 ENCOUNTER — Encounter: Payer: Self-pay | Admitting: Ophthalmology

## 2022-05-14 NOTE — Anesthesia Preprocedure Evaluation (Addendum)
Anesthesia Evaluation  Patient identified by MRN, date of birth, ID band Patient awake    Reviewed: Allergy & Precautions, NPO status , Patient's Chart, lab work & pertinent test results  History of Anesthesia Complications Negative for: history of anesthetic complications  Airway Mallampati: I   Neck ROM: Full    Dental   Crowns :   Pulmonary neg pulmonary ROS,    Pulmonary exam normal breath sounds clear to auscultation       Cardiovascular hypertension, + CAD  Normal cardiovascular exam+ dysrhythmias (PSVT)  Rhythm:Regular Rate:Normal  Frequent PVCs   Neuro/Psych  Neuromuscular disease (neuropathy)    GI/Hepatic GERD  Medicated and Controlled,  Endo/Other  Hypothyroidism   Renal/GU negative Renal ROS     Musculoskeletal  (+) Arthritis ,   Abdominal   Peds  Hematology  (+) Blood dyscrasia, anemia ,   Anesthesia Other Findings   Reproductive/Obstetrics                            Anesthesia Physical Anesthesia Plan  ASA: 3  Anesthesia Plan: MAC   Post-op Pain Management:    Induction: Intravenous  PONV Risk Score and Plan: 2 and TIVA, Midazolam and Treatment may vary due to age or medical condition  Airway Management Planned: Natural Airway  Additional Equipment:   Intra-op Plan:   Post-operative Plan:   Informed Consent: I have reviewed the patients History and Physical, chart, labs and discussed the procedure including the risks, benefits and alternatives for the proposed anesthesia with the patient or authorized representative who has indicated his/her understanding and acceptance.       Plan Discussed with: CRNA  Anesthesia Plan Comments: (LMA/GETA backup discussed.  Patient consented for risks of anesthesia including but not limited to:  - adverse reactions to medications - damage to eyes, teeth, lips or other oral mucosa - nerve damage due to positioning  -  sore throat or hoarseness - damage to heart, brain, nerves, lungs, other parts of body or loss of life  Informed patient about role of CRNA in peri- and intra-operative care.  Patient voiced understanding.)       Anesthesia Quick Evaluation

## 2022-05-20 NOTE — Discharge Instructions (Signed)

## 2022-05-25 ENCOUNTER — Ambulatory Visit (AMBULATORY_SURGERY_CENTER): Payer: Medicare Other | Admitting: Anesthesiology

## 2022-05-25 ENCOUNTER — Encounter
Admission: RE | Disposition: A | Discharge status: Home / Self Care | Payer: Self-pay | Source: Home / Self Care | Attending: Ophthalmology

## 2022-05-25 ENCOUNTER — Other Ambulatory Visit: Payer: Self-pay

## 2022-05-25 ENCOUNTER — Encounter: Payer: Self-pay | Admitting: Ophthalmology

## 2022-05-25 ENCOUNTER — Ambulatory Visit: Payer: Medicare Other | Admitting: Anesthesiology

## 2022-05-25 ENCOUNTER — Ambulatory Visit
Admission: RE | Admit: 2022-05-25 | Discharge: 2022-05-25 | Disposition: A | Discharge status: Home / Self Care | Payer: Medicare Other | Attending: Ophthalmology | Admitting: Ophthalmology

## 2022-05-25 DIAGNOSIS — M1990 Unspecified osteoarthritis, unspecified site: Secondary | ICD-10-CM | POA: Diagnosis not present

## 2022-05-25 DIAGNOSIS — G709 Myoneural disorder, unspecified: Secondary | ICD-10-CM | POA: Diagnosis not present

## 2022-05-25 DIAGNOSIS — K219 Gastro-esophageal reflux disease without esophagitis: Secondary | ICD-10-CM | POA: Insufficient documentation

## 2022-05-25 DIAGNOSIS — I2510 Atherosclerotic heart disease of native coronary artery without angina pectoris: Secondary | ICD-10-CM | POA: Diagnosis not present

## 2022-05-25 DIAGNOSIS — I10 Essential (primary) hypertension: Secondary | ICD-10-CM | POA: Insufficient documentation

## 2022-05-25 DIAGNOSIS — Z79899 Other long term (current) drug therapy: Secondary | ICD-10-CM | POA: Diagnosis not present

## 2022-05-25 DIAGNOSIS — Z79890 Hormone replacement therapy (postmenopausal): Secondary | ICD-10-CM | POA: Insufficient documentation

## 2022-05-25 DIAGNOSIS — E039 Hypothyroidism, unspecified: Secondary | ICD-10-CM | POA: Insufficient documentation

## 2022-05-25 DIAGNOSIS — H2512 Age-related nuclear cataract, left eye: Secondary | ICD-10-CM | POA: Diagnosis present

## 2022-05-25 HISTORY — DX: Dizziness and giddiness: R42

## 2022-05-25 HISTORY — DX: Polyneuropathy, unspecified: G62.9

## 2022-05-25 HISTORY — PX: CATARACT EXTRACTION W/PHACO: SHX586

## 2022-05-25 SURGERY — PHACOEMULSIFICATION, CATARACT, WITH IOL INSERTION
Anesthesia: Monitor Anesthesia Care | Site: Eye | Laterality: Left

## 2022-05-25 MED ORDER — SIGHTPATH DOSE#1 NA HYALUR & NA CHOND-NA HYALUR IO KIT
PACK | INTRAOCULAR | Status: DC | PRN
  Administered 2022-05-25: 1 via OPHTHALMIC

## 2022-05-25 MED ORDER — ACETAMINOPHEN 160 MG/5ML PO SOLN: 325.0000 mg | ORAL | Status: DC | PRN

## 2022-05-25 MED ORDER — FENTANYL CITRATE (PF) 100 MCG/2ML IJ SOLN
INTRAMUSCULAR | Status: DC | PRN
  Administered 2022-05-25: 50 ug via INTRAVENOUS

## 2022-05-25 MED ORDER — LIDOCAINE HCL (PF) 2 % IJ SOLN
INTRAOCULAR | Status: DC | PRN
  Administered 2022-05-25: 4 mL via INTRAOCULAR

## 2022-05-25 MED ORDER — MIDAZOLAM HCL 2 MG/2ML IJ SOLN
INTRAMUSCULAR | Status: DC | PRN
  Administered 2022-05-25: 1 mg via INTRAVENOUS

## 2022-05-25 MED ORDER — TETRACAINE HCL 0.5 % OP SOLN
1.0000 [drp] | OPHTHALMIC | Status: DC | PRN
  Administered 2022-05-25 (×3): 1 [drp] via OPHTHALMIC

## 2022-05-25 MED ORDER — ACETAMINOPHEN 325 MG PO TABS: 650.0000 mg | ORAL_TABLET | Freq: Once | ORAL | Status: DC | PRN

## 2022-05-25 MED ORDER — LACTATED RINGERS IV SOLN: INTRAVENOUS | Status: DC

## 2022-05-25 MED ORDER — ONDANSETRON HCL 4 MG/2ML IJ SOLN: 4.0000 mg | Freq: Once | INTRAMUSCULAR | Status: DC | PRN

## 2022-05-25 MED ORDER — ARMC OPHTHALMIC DILATING DROPS
1.0000 | OPHTHALMIC | Status: DC | PRN
  Administered 2022-05-25 (×3): 1 via OPHTHALMIC

## 2022-05-25 MED ORDER — SIGHTPATH DOSE#1 BSS IO SOLN
INTRAOCULAR | Status: DC | PRN
  Administered 2022-05-25: 15 mL via INTRAOCULAR

## 2022-05-25 MED ORDER — SIGHTPATH DOSE#1 BSS IO SOLN
INTRAOCULAR | Status: DC | PRN
  Administered 2022-05-25: 118 mL via OPHTHALMIC

## 2022-05-25 MED ORDER — MOXIFLOXACIN HCL 0.5 % OP SOLN
OPHTHALMIC | Status: DC | PRN
  Administered 2022-05-25: 0.2 mL via OPHTHALMIC

## 2022-05-25 SURGICAL SUPPLY — 17 items
CANNULA ANT/CHMB 27G (MISCELLANEOUS) IMPLANT
CANNULA ANT/CHMB 27GA (MISCELLANEOUS) IMPLANT
CATARACT SUITE SIGHTPATH (MISCELLANEOUS) ×2 IMPLANT
DISSECTOR HYDRO NUCLEUS 50X22 (MISCELLANEOUS) ×2 IMPLANT
FEE CATARACT SUITE SIGHTPATH (MISCELLANEOUS) ×1 IMPLANT
GLOVE SURG GAMMEX PI TX LF 7.5 (GLOVE) ×2 IMPLANT
GLOVE SURG SYN 8.5  E (GLOVE) ×1
GLOVE SURG SYN 8.5 E (GLOVE) ×1 IMPLANT
GLOVE SURG SYN 8.5 PF PI (GLOVE) ×1 IMPLANT
LENS IOL ACRSF IQ ULTRA 24.0 (Intraocular Lens) IMPLANT
LENS IOL ACRYSOF IQ 24.0 (Intraocular Lens) ×2 IMPLANT
NDL FILTER BLUNT 18X1 1/2 (NEEDLE) ×1 IMPLANT
NEEDLE FILTER BLUNT 18X 1/2SAF (NEEDLE) ×1
NEEDLE FILTER BLUNT 18X1 1/2 (NEEDLE) ×1 IMPLANT
SYR 3ML LL SCALE MARK (SYRINGE) ×2 IMPLANT
SYR 5ML LL (SYRINGE) ×2 IMPLANT
WATER STERILE IRR 250ML POUR (IV SOLUTION) ×2 IMPLANT

## 2022-05-25 NOTE — H&P (Signed)
[facility]   Primary Care Physician:  [facility], [place] Ophthalmologist: Dr. Benay Pillow  Pre-Procedure History & Physical: HPI:  Alejandra Singh is a 81 y.o. female here for cataract surgery.   Past Medical History:  Diagnosis Date   Arthritis    Essential hypertension    FH: colonic polyps    Frequent PVCs    RV outflow tract   GERD (gastroesophageal reflux disease)    Hyperlipidemia    Hypothyroidism    Iron deficiency anemia    Mild mitral regurgitation    Neuropathy    lower leg   Non-obstructive CAD (coronary artery disease)    a. 08/2015 ETT: Ex time 6:18. No ST/T changes;  b. 09/2016 Cath: LM nl, LAD 50d, small bridging, LCX nl, RCA nl, EF 55-65%-->Med Rx. c. Cor CT 03/2018 calcium score 0, 25-50% LAD.   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   Vertigo    none for several years    Past Surgical History:  Procedure Laterality Date   ANKLE SURGERY Left    Fracture   CARDIAC CATHETERIZATION N/A 09/22/2016   Procedure: Left Heart Cath and Coronary Angiography;  Surgeon: Leonie Man, MD;  Location: [facility];  Service: Cardiovascular;  Laterality: N/A;   COLONOSCOPY N/A 08/04/2021   Procedure: COLONOSCOPY;  Surgeon: Annamaria Helling, DO;  Location[facility] ENDOSCOPY;  Service: Gastroenterology;  Laterality: N/A;   COLONOSCOPY WITH PROPOFOL N/A 07/05/2015   Procedure: COLONOSCOPY WITH PROPOFOL;  Surgeon: Manya Silvas, MD;  Location: [facility] ENDOSCOPY;  Service: Endoscopy;  Laterality: N/A;   CYSTOCELE REPAIR N/A 04/29/2020   Procedure: ANTERIOR REPAIR (CYSTOCELE);  Surgeon: Schermerhorn, Gwen Her, MD;  Location: [facility] ORS;  Service: Gynecology;  Laterality: N/A;   DILATION AND CURETTAGE OF UTERUS     ESOPHAGOGASTRODUODENOSCOPY (EGD) WITH PROPOFOL N/A 03/19/2016   Procedure: ESOPHAGOGASTRODUODENOSCOPY (EGD) WITH PROPOFOL;  Surgeon: Manya Silvas, MD;  Location: [facility]  ENDOSCOPY;  Service: Endoscopy;  Laterality: N/A;   EYE SURGERY Right 09/17/2004   cataract [facility]   TONSILLECTOMY     VAGINAL HYSTERECTOMY N/A 04/29/2020   Procedure: HYSTERECTOMY VAGINAL;  Surgeon: Schermerhorn, Gwen Her, MD;  Location: [facility] ORS;  Service: Gynecology;  Laterality: N/A;    Prior to Admission medications   Medication Sig Start Date End Date Taking? Authorizing Provider  Alpha-Lipoic Acid 200 MG TABS Take 1 tablet by mouth in the morning, at noon, and at bedtime.   Yes [provider]  amLODipine (NORVASC) 2.5 MG tablet Take 1 tablet by mouth once daily 02/11/22  Yes Satira Sark, MD  aspirin EC 81 MG tablet Take 81 mg by mouth daily.   Yes [provider]  esomeprazole (NEXIUM) 40 MG capsule Take 40 mg by mouth daily as needed (reflux).    Yes [provider]  Estriol 10 % CREA by Does not apply route.   Yes [provider]  flecainide (TAMBOCOR) 50 MG tablet Take 1 tablet by mouth twice daily 02/11/22  Yes Satira Sark, MD  gabapentin (NEURONTIN) 100 MG capsule Take 1 capsule by mouth in the morning and at bedtime. 11/11/21  Yes [provider]  levothyroxine (SYNTHROID, LEVOTHROID) 50 MCG tablet Take 50 mcg by mouth daily before breakfast.   Yes [provider]  Multiple Vitamins-Minerals (MULTIVITAL) tablet Take 1 tablet by mouth daily.   Yes [provider]  rosuvastatin (CRESTOR)  5 MG tablet Take 1 tablet (5 mg total) by mouth daily. 09/10/21  Yes Pixie Casino, MD    Allergies as of 04/24/2022 - Review Complete 12/25/2021  Allergen Reaction Noted   Alendronate Other (See Comments) 03/18/2016    Family History  Problem Relation Age of Onset   Alzheimer's disease Mother    Dementia Mother    Stroke Father    Coronary artery disease Neg Hx    Breast cancer Neg Hx     Social History   Socioeconomic History   Marital status: Married    Spouse name: Not on file   Number  of children: Not on file   Years of education: Not on file   Highest education level: Not on file  Occupational History   Occupation: Retired - insurance  Tobacco Use   Smoking status: Never   Smokeless tobacco: Never  Vaping Use   Vaping Use: Never used  Substance and Sexual Activity   Alcohol use: No   Drug use: No   Sexual activity: Not on file  Other Topics Concern   Not on file  Social History Narrative   Lives in [place] with husband.  Does not routinely exercise.   Social Determinants of Health   Financial Resource Strain: Not on file  Food Insecurity: Not on file  Transportation Needs: Not on file  Physical Activity: Not on file  Stress: Not on file  Social Connections: Not on file  Intimate Partner Violence: Not on file    Review of Systems: See HPI, otherwise negative ROS  Physical Exam: BP (!) 152/87   Pulse 67   Temp 98.5 F (36.9 C) (Temporal)   Resp 20   Ht '5\' 5"'$  (1.651 m)   Wt 64.4 kg   SpO2 (!) 14%   BMI 23.63 kg/m  General:   Alert, cooperative in NAD Head:  Normocephalic and atraumatic. Respiratory:  Normal work of breathing. Cardiovascular:  RRR  Impression/Plan: Alejandra Singh is here for cataract surgery.  Risks, benefits, limitations, and alternatives regarding cataract surgery have been reviewed with the patient.  Questions have been answered.  All parties agreeable.   Benay Pillow, MD  05/25/2022, 12:22 PM

## 2022-05-25 NOTE — Op Note (Signed)
OPERATIVE NOTE  Alejandra Singh [id number] 05/25/2022   PREOPERATIVE DIAGNOSIS:  Nuclear sclerotic cataract left eye.  H25.12   POSTOPERATIVE DIAGNOSIS:    Nuclear sclerotic cataract left eye.     PROCEDURE:  Phacoemusification with posterior chamber intraocular lens placement of the left eye   LENS:   Implant Name Type Inv. Item Serial No. Manufacturer Lot No. LRB No. Used Action  LENS IOL ACRYSOF IQ 24.0 - [id number] Intraocular Lens LENS IOL ACRYSOF IQ 24.0 [id number] [employer]  Left 1 Implanted      Procedure(s): CATARACT EXTRACTION PHACO AND INTRAOCULAR LENS PLACEMENT (IOC) LEFT 5.42 00:47.4 (Left)  [id number] +24.0   ULTRASOUND TIME: 0 minutes 47 seconds.  CDE 5.42   SURGEON:  Benay Pillow, MD, MPH   ANESTHESIA:  Topical with tetracaine drops augmented with 1% preservative-free intracameral lidocaine.  ESTIMATED BLOOD LOSS: <1 mL   COMPLICATIONS:  None.   DESCRIPTION OF PROCEDURE:  The patient was identified in the holding room and transported to the operating room and placed in the supine position under the operating microscope.  The left eye was identified as the operative eye and it was prepped and draped in the usual sterile ophthalmic fashion.   A 1.0 millimeter clear-corneal paracentesis was made at the 5:00 position. 0.5 ml of preservative-free 1% lidocaine with epinephrine was injected into the anterior chamber.  The anterior chamber was filled with viscoelastic.  A 2.4 millimeter keratome was used to make a near-clear corneal incision at the 2:00 position.  A curvilinear capsulorrhexis was made with a cystotome and capsulorrhexis forceps.  Balanced salt solution was used to hydrodissect and hydrodelineate the nucleus.   Phacoemulsification was then used in stop and chop fashion to remove the lens nucleus and epinucleus.  The remaining cortex was then removed using the irrigation and aspiration handpiece. Viscoelastic was then placed into the capsular bag to distend it  for lens placement.  A lens was then injected into the capsular bag.  The remaining viscoelastic was aspirated.   Wounds were hydrated with balanced salt solution.  The anterior chamber was inflated to a physiologic pressure with balanced salt solution.  Intracameral vigamox 0.1 mL undiltued was injected into the eye and a drop placed onto the ocular surface.  No wound leaks were noted.  The patient was taken to the recovery room in stable condition without complications of anesthesia or surgery  Benay Pillow 05/25/2022, 12:50 PM

## 2022-05-25 NOTE — Transfer of Care (Signed)
Immediate Anesthesia Transfer of Care Note  Patient: Alejandra Singh  Procedure(s) Performed: CATARACT EXTRACTION PHACO AND INTRAOCULAR LENS PLACEMENT (IOC) LEFT 5.42 00:47.4 (Left: Eye)  Patient Location: PACU  Anesthesia Type:MAC  Level of Consciousness: awake, alert  and oriented  Airway & Oxygen Therapy: Patient Spontanous Breathing  Post-op Assessment: Report given to RN and Post -op Vital signs reviewed and stable  Post vital signs: Reviewed and stable  Last Vitals:  Vitals Value Taken Time  BP 140/59 05/25/22 1253  Temp 36.3 C 05/25/22 1253  Pulse 59 05/25/22 1255  Resp 16 05/25/22 1255  SpO2 95 % 05/25/22 1255  Vitals shown include unvalidated device data.  Last Pain:  Vitals:   05/25/22 1253  TempSrc:   PainSc: 0-No pain         Complications: No notable events documented.

## 2022-05-25 NOTE — Anesthesia Postprocedure Evaluation (Signed)
Anesthesia Post Note  Patient: Alejandra Singh  Procedure(s) Performed: CATARACT EXTRACTION PHACO AND INTRAOCULAR LENS PLACEMENT (IOC) LEFT 5.42 00:47.4 (Left: Eye)     Patient location during evaluation: PACU Anesthesia Type: MAC Level of consciousness: awake and alert, oriented and patient cooperative Pain management: pain level controlled Vital Signs Assessment: post-procedure vital signs reviewed and stable Respiratory status: spontaneous breathing, nonlabored ventilation and respiratory function stable Cardiovascular status: blood pressure returned to baseline and stable Postop Assessment: adequate PO intake Anesthetic complications: no   No notable events documented.  Darrin Nipper

## 2022-05-26 ENCOUNTER — Encounter: Payer: Self-pay | Admitting: Ophthalmology

## 2022-06-02 ENCOUNTER — Other Ambulatory Visit: Payer: Self-pay | Admitting: Family Medicine

## 2022-06-02 DIAGNOSIS — Z1231 Encounter for screening mammogram for malignant neoplasm of breast: Secondary | ICD-10-CM

## 2022-06-30 ENCOUNTER — Ambulatory Visit
Admission: RE | Admit: 2022-06-30 | Discharge: 2022-06-30 | Disposition: A | Discharge status: Home / Self Care | Payer: Medicare Other | Source: Ambulatory Visit | Attending: Family Medicine | Admitting: Family Medicine

## 2022-06-30 DIAGNOSIS — Z1231 Encounter for screening mammogram for malignant neoplasm of breast: Secondary | ICD-10-CM | POA: Insufficient documentation

## 2022-07-15 ENCOUNTER — Encounter: Payer: Self-pay | Admitting: Cardiology

## 2022-07-15 ENCOUNTER — Ambulatory Visit: Payer: Medicare Other | Attending: Cardiology | Admitting: Cardiology

## 2022-07-15 VITALS — BP 140/80 | HR 57 | Ht 64.0 in | Wt 146.6 lb

## 2022-07-15 DIAGNOSIS — I2510 Atherosclerotic heart disease of native coronary artery without angina pectoris: Secondary | ICD-10-CM | POA: Diagnosis present

## 2022-07-15 DIAGNOSIS — I493 Ventricular premature depolarization: Secondary | ICD-10-CM | POA: Insufficient documentation

## 2022-07-15 NOTE — Progress Notes (Signed)
Cardiology Office Note  Date: 07/15/2022   ID: Alejandra Singh, Alejandra Singh [birth year], MRN [id number]  PCP:  [facility] [place]  Cardiologist:  Rozann Lesches, MD Electrophysiologist:  None   Chief Complaint  Patient presents with   Cardiac follow-up    History of Present Illness: Alejandra Singh is an 81 y.o. female last seen in March.  She is here for a routine visit.  Continues to do very well from a cardiac perspective.  She denies any sense of palpitations, no dizziness or syncope.  Also no angina.  We went over her medications.  She reports no intolerances to current regimen.  Doing well on low-dose Crestor with last LDL 70 in March.  I personally reviewed her ECG today which shows sinus bradycardia with borderline prolonged PR interval and nonspecific ST changes, QTc normal.  Past Medical History:  Diagnosis Date   Arthritis    Essential hypertension    FH: colonic polyps    Frequent PVCs    RV outflow tract   GERD (gastroesophageal reflux disease)    Hyperlipidemia    Hypothyroidism    Iron deficiency anemia    Mild mitral regurgitation    Neuropathy    lower leg   Non-obstructive CAD (coronary artery disease)    a. 08/2015 ETT: Ex time 6:18. No ST/T changes;  b. 09/2016 Cath: LM nl, LAD 50d, small bridging, LCX nl, RCA nl, EF 55-65%-->Med Rx. c. Cor CT 03/2018 calcium score 0, 25-50% LAD.   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   Vertigo    none for several years    Past Surgical History:  Procedure Laterality Date   ANKLE SURGERY Left    Fracture   CARDIAC CATHETERIZATION N/A 09/22/2016   Procedure: Left Heart Cath and Coronary Angiography;  Surgeon: Leonie Man, MD;  Location: [place] [facility];  Service: Cardiovascular;  Laterality: N/A;   CATARACT EXTRACTION W/PHACO Left 05/25/2022   Procedure: CATARACT EXTRACTION PHACO AND INTRAOCULAR LENS PLACEMENT (IOC) LEFT 5.42 00:47.4;   Surgeon: Eulogio Bear, MD;  Location: [place];  Service: Ophthalmology;  Laterality: Left;   COLONOSCOPY N/A 08/04/2021   Procedure: COLONOSCOPY;  Surgeon: Annamaria Helling, DO;  Location[facility] ENDOSCOPY;  Service: Gastroenterology;  Laterality: N/A;   COLONOSCOPY WITH PROPOFOL N/A 07/05/2015   Procedure: COLONOSCOPY WITH PROPOFOL;  Surgeon: Manya Silvas, MD;  Location: [facility] ENDOSCOPY;  Service: Endoscopy;  Laterality: N/A;   CYSTOCELE REPAIR N/A 04/29/2020   Procedure: ANTERIOR REPAIR (CYSTOCELE);  Surgeon: Schermerhorn, Gwen Her, MD;  Location: [facility] ORS;  Service: Gynecology;  Laterality: N/A;   DILATION AND CURETTAGE OF UTERUS     ESOPHAGOGASTRODUODENOSCOPY (EGD) WITH PROPOFOL N/A 03/19/2016   Procedure: ESOPHAGOGASTRODUODENOSCOPY (EGD) WITH PROPOFOL;  Surgeon: Manya Silvas, MD;  Location: [facility] ENDOSCOPY;  Service: Endoscopy;  Laterality: N/A;   EYE SURGERY Right 09/17/2004   cataract - [facility] - [facility]   TONSILLECTOMY     VAGINAL HYSTERECTOMY N/A 04/29/2020   Procedure: HYSTERECTOMY VAGINAL;  Surgeon: Schermerhorn, Gwen Her, MD;  Location: [facility] ORS;  Service: Gynecology;  Laterality: N/A;    Current Outpatient Medications  Medication Sig Dispense Refill   Alpha-Lipoic Acid 200 MG TABS Take 1 tablet by mouth in the morning, at noon, and at bedtime.     amLODipine (NORVASC) 2.5 MG tablet Take 1 tablet by mouth once daily 90 tablet 3   aspirin EC 81 MG tablet  Take 81 mg by mouth daily.     esomeprazole (NEXIUM) 40 MG capsule Take 40 mg by mouth daily as needed (reflux).      Estriol 10 % CREA by Does not apply route.     flecainide (TAMBOCOR) 50 MG tablet Take 1 tablet by mouth twice daily 180 tablet 3   gabapentin (NEURONTIN) 100 MG capsule Take 1 capsule by mouth in the morning and at bedtime.     levothyroxine (SYNTHROID, LEVOTHROID) 50 MCG tablet Take 50 mcg by mouth daily before breakfast.     Multiple Vitamins-Minerals (MULTIVITAL) tablet Take 1  tablet by mouth daily.     rosuvastatin (CRESTOR) 5 MG tablet Take 1 tablet (5 mg total) by mouth daily. 90 tablet 3   No current facility-administered medications for this visit.   Allergies:  Alendronate   ROS:  No syncope.  Physical Exam: VS:  BP (!) 140/80   Pulse (!) 57   Ht $R'5\' 4"'UV$  (1.626 m)   Wt 146 lb 9.6 oz (66.5 kg)   SpO2 97%   BMI 25.16 kg/m , BMI Body mass index is 25.16 kg/m.  Wt Readings from Last 3 Encounters:  07/15/22 146 lb 9.6 oz (66.5 kg)  05/25/22 142 lb (64.4 kg)  12/25/21 144 lb 6.4 oz (65.5 kg)    General: Patient appears comfortable at rest. HEENT: Conjunctiva and lids normal. Neck: Supple, no elevated JVP or carotid bruits. Lungs: Clear to auscultation, nonlabored breathing at rest. Cardiac: Regular rate and rhythm, no S3 or significant systolic murmur, no pericardial rub. Extremities: No pitting edema.  ECG:  An ECG dated 06/16/2021 was personally reviewed today and demonstrated:  Sinus rhythm with leftward axis.  Recent Labwork: March 2023: ESR 10, hemoglobin 13.3, platelets 261, TSH 2.3, hemoglobin A1c 5.8%, potassium 3.9, BUN 14, creatinine 0.6, AST 18, ALT 15    Component Value Date/Time   CHOL 159 12/29/2021 0905   TRIG 106 12/29/2021 0905   HDL 68 12/29/2021 0905   CHOLHDL 2.3 12/29/2021 0905   VLDL 21 12/29/2021 0905   LDLCALC 70 12/29/2021 0905    Other Studies Reviewed Today:  Echocardiogram 05/27/2021:  1. Left ventricular ejection fraction, by estimation, is 60 to 65%. The  left ventricle has normal function. The left ventricle has no regional  wall motion abnormalities. Left ventricular diastolic parameters were  normal.   2. Right ventricular systolic function is normal. The right ventricular  size is normal. There is normal pulmonary artery systolic pressure.   3. The mitral valve is normal in structure. Trivial mitral valve  regurgitation. No evidence of mitral stenosis.   4. The aortic valve is tricuspid. Aortic valve  regurgitation is not  visualized. No aortic stenosis is present.   5. The inferior vena cava is normal in size with greater than 50%  respiratory variability, suggesting right atrial pressure of 3 mmHg.   Assessment and Plan:  1.  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  She is not on any beta-blocker at this point given history of symptomatic bradycardia.  LVEF normal by last assessment.  No sudden syncope.  I reviewed her ECG, normal QRS and QTc.  Continue with current plan.  2.  Asymptomatic, nonobstructive coronary atherosclerosis.  Continue aspirin and Crestor.  Last LDL 70.  Medication Adjustments/Labs and Tests Ordered: Current medicines are reviewed at length with the patient today.  Concerns regarding medicines are outlined above.   Tests Ordered:  Orders Placed This Encounter  Procedures   EKG 12-Lead    Medication Changes: No orders of the defined types were placed in this encounter.   Disposition:  Follow up  6 months.  Signed, Satira Sark, MD, [facility] 07/15/2022 9:01 AM    [place] at [facility] [address], [place], [place] [address] Phone: [phone]; Fax: [phone]

## 2022-07-15 NOTE — Patient Instructions (Signed)
Medication Instructions:  Your physician recommends that you continue on your current medications as directed. Please refer to the Current Medication list given to you today.   Labwork: None today  Testing/Procedures: None today  Follow-Up: 6 months  Any Other Special Instructions Will Be Listed Below (If Applicable).  If you need a refill on your cardiac medications before your next appointment, please call your pharmacy.  

## 2022-09-21 ENCOUNTER — Other Ambulatory Visit: Payer: Self-pay | Admitting: Internal Medicine

## 2022-12-08 ENCOUNTER — Telehealth: Payer: Self-pay | Admitting: Cardiology

## 2022-12-08 NOTE — Telephone Encounter (Signed)
Patient has been concerned her elevated BP recently. She has also has some headaches. I asked her to take her BP 3 hours after taking her amlodipine.She will keep a diary and bring to 12/21/22 appointment.She had recent spinal injection for her back pain which she feels may contribute to her discomfort.I advised her to go to the ED if symptoms worsen.

## 2022-12-08 NOTE — Telephone Encounter (Signed)
Pt c/o of Chest Pain: STAT if CP now or developed within 24 hours  1. Are you having CP right now? No   2. Are you experiencing any other symptoms (ex. SOB, nausea, vomiting, sweating)? States her head doesn't feel right either. "Something off"   3. How long have you been experiencing CP? 2 weeks ago.    4. Is your CP continuous or coming and going? Coming and going   5. Have you taken Nitroglycerin? No.    Pt states she took steroid shot for her back recently and since then she has been having a strange feeling in her chest and bp readings as follows:  BP 151/83 HR 73 BP 160/86 HR 68  BP 124/67 HR 84?

## 2022-12-14 NOTE — Progress Notes (Deleted)
Cardiology Office Note:    Date:  12/14/2022   ID:  Alejandra, Singh [birth year], MRN [id number]  PCP:  [facility] [place] [facility] Cardiologist:  Rozann Lesches, MD { Click to update primary MD,subspecialty MD or APP then REFRESH:1}  *** Referring MD: [facility], [place]   Chief Complaint:  No chief complaint on file. {Click here for Visit Info    :1}    History of Present Illness:   Alejandra Singh is a 82 y.o. female with history of nonobstructive CAD cardiac CTA 03/2018 calcium score 0 mild CAD in the proximal to mid LAD 25 to 50%, frequent PVCs from the RV outflow track on flecainide, not on beta-blockers due to symptomatic bradycardia in the past, dyslipidemia, hypertension, hypothyroidism.      Patient added onto my schedule because of elevated blood pressures and funny sensation in her chest since she has been on steroids.    Past Medical History:  Diagnosis Date   Arthritis    Essential hypertension    FH: colonic polyps    Frequent PVCs    RV outflow tract   GERD (gastroesophageal reflux disease)    Hyperlipidemia    Hypothyroidism    Iron deficiency anemia    Mild mitral regurgitation    Neuropathy    lower leg   Non-obstructive CAD (coronary artery disease)    a. 08/2015 ETT: Ex time 6:18. No ST/T changes;  b. 09/2016 Cath: LM nl, LAD 50d, small bridging, LCX nl, RCA nl, EF 55-65%-->Med Rx. c. Cor CT 03/2018 calcium score 0, 25-50% LAD.   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    Allergies:   Alendronate   Social History   Tobacco Use   Smoking status: Never   Smokeless tobacco: Never  Vaping Use   Vaping Use: Never used  Substance Use Topics   Alcohol use: No   Drug  use: No    Family Hx: The patient's family history includes Alzheimer's disease in her mother; Dementia in her mother; Stroke in her father. There is no history of Coronary artery disease or Breast cancer.  ROS     Physical Exam:    VS:  There were no vitals taken for this visit.    Wt Readings from Last 3 Encounters:  07/15/22 146 lb 9.6 oz (66.5 kg)  05/25/22 142 lb (64.4 kg)  12/25/21 144 lb 6.4 oz (65.5 kg)    Physical Exam  GEN: Well nourished, well developed, in no acute distress  HEENT: normal  Neck: no JVD, carotid bruits, or masses Cardiac:RRR; no murmurs, rubs, or gallops  Respiratory:  clear to auscultation bilaterally, normal work of breathing GI: soft, nontender, nondistended, + BS Ext: without cyanosis, clubbing, or edema, Good distal pulses bilaterally MS: no deformity or atrophy  Skin: warm and dry, no rash Neuro:  Alert and Oriented x 3, Strength and sensation are intact Psych: euthymic mood, full affect        EKGs/Labs/Other Test Reviewed:    EKG:  EKG is *** ordered today.  The ekg ordered today demonstrates ***  Recent Labs: No results found for requested labs within last 365 days.   Recent Lipid Panel Recent Labs    12/29/21  0905  CHOL 159  TRIG 106  HDL 68  VLDL 21  LDLCALC 70     Prior CV Studies: {Select studies to display:26339}  Coronary CTA 03/2018 IMPRESSION: 1. Coronary calcium score of 0. This was 0 percentile for age and sex matched control.   2. Normal coronary origin with right dominance.   3. Mild CAD in the proximal to mid LAD with stenosis 25-50%. Continue aggressive medical management.   Electronically Signed: By: Ena Dawley On: 03/26/2018 17:07Echocardiogram 05/27/2021:  1. Left ventricular ejection fraction, by estimation, is 60 to 65%. The  left ventricle has normal function. The left ventricle has no regional  wall motion abnormalities. Left ventricular diastolic parameters were  normal.   2. Right  ventricular systolic function is normal. The right ventricular  size is normal. There is normal pulmonary artery systolic pressure.   3. The mitral valve is normal in structure. Trivial mitral valve  regurgitation. No evidence of mitral stenosis.   4. The aortic valve is tricuspid. Aortic valve regurgitation is not  visualized. No aortic stenosis is present.   5. The inferior vena cava is normal in size with greater than 50%  respiratory variability, suggesting right atrial pressure of 3 mmHg.      Risk Assessment/Calculations/Metrics:   {Does this patient have ATRIAL FIBRILLATION?[phone]}     No BP recorded.  {Refresh Note OR Click here to enter BP  :1}***    ASSESSMENT & PLAN:   No problem-specific Assessment & Plan notes found for this encounter.   Nonobstructive CAD on coronary CTA 03/2018  Hypertension  PVCs from RVOT on flecainide  Hyperlipidemia        {Are you ordering a CV Procedure (e.g. stress test, cath, DCCV, TEE, etc)?   Press F2        :[id number]   Dispo:  No follow-ups on file.   Medication Adjustments/Labs and Tests Ordered: Current medicines are reviewed at length with the patient today.  Concerns regarding medicines are outlined above.  Tests Ordered: No orders of the defined types were placed in this encounter.  Medication Changes: No orders of the defined types were placed in this encounter.  Signed, Ermalinda Barrios, PA-C  12/14/2022 2:28 PM    [address], [place], [place]  [address] Phone: [phone]; Fax: [phone]

## 2022-12-21 ENCOUNTER — Ambulatory Visit: Payer: Medicare Other | Admitting: Physician Assistant

## 2023-01-20 NOTE — Progress Notes (Unsigned)
    Cardiology Office Note  Date: 01/21/2023   ID: TATEANA PIGNATELLI, DOB 29-Jul-1941, MRN [id number]  History of Present Illness:  Alejandra Singh is an 82 y.o. female last seen in September 2023.  She is here today with her husband for a follow-up visit.  Overall doing well from a cardiac perspective.  She does not report any palpitations or chest pain, no dizziness or syncope.  Sometimes feels "weak" and also describes mild neuropathy symptoms.  She does not report any falls.  Using a cane to ambulate.  I reviewed her medications.  No significant changes.  Her last LDL was 59 on low-dose Crestor.  Physical Exam: VS:  BP 128/82   Pulse 62   Ht 5\' 4"  (1.626 m)   Wt 140 lb 12.8 oz (63.9 kg)   SpO2 96%   BMI 24.17 kg/m , BMI Body mass index is 24.17 kg/m.  Wt Readings from Last 3 Encounters:  01/21/23 140 lb 12.8 oz (63.9 kg)  07/15/22 146 lb 9.6 oz (66.5 kg)  05/25/22 142 lb (64.4 kg)    General: Patient appears comfortable at rest. HEENT: Conjunctiva and lids normal. Neck: Supple, no elevated JVP or carotid bruits. Lungs: Clear to auscultation, nonlabored breathing at rest. Cardiac: Regular rate and rhythm, no S3 or significant systolic murmur. Extremities: No pitting edema.  ECG:  An ECG dated 07/15/2022 was personally reviewed today and demonstrated:  Sinus bradycardia with borderline prolonged PR interval and nonspecific ST changes, normal QTc.  Labwork: November 2023: Cholesterol 142, triglycerides 90, HDL 66, LDL 59, BUN 15, creatinine 0.5, potassium 4.1  Other Studies Reviewed Today:  No interval cardiac testing for review today.  Assessment and Plan:  1.  Mild nonobstructive CAD, most recently assessed via coronary CTA in 2019 with calcium score of 0 and 25-50% LAD stenosis.  She has been managed medically.  No active angina at this time.  Continue Crestor, last LDL 59.  2.  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  She has continued on flecainide with good suppression.   Echocardiogram in August 2022 revealed normal LVEF at 60 to 65%.  No significant palpitations or syncope.  Disposition:  Follow up  6 months.  Signed, Satira Sark, M.D., F.A.C.C. [place] at [facility]

## 2023-01-21 ENCOUNTER — Ambulatory Visit: Payer: Medicare Other | Attending: Cardiology | Admitting: Cardiology

## 2023-01-21 ENCOUNTER — Encounter: Payer: Self-pay | Admitting: Cardiology

## 2023-01-21 VITALS — BP 128/82 | HR 62 | Ht 64.0 in | Wt 140.8 lb

## 2023-01-21 DIAGNOSIS — I2510 Atherosclerotic heart disease of native coronary artery without angina pectoris: Secondary | ICD-10-CM

## 2023-01-21 DIAGNOSIS — I493 Ventricular premature depolarization: Secondary | ICD-10-CM | POA: Diagnosis not present

## 2023-01-21 NOTE — Patient Instructions (Signed)
Medication Instructions:  Your physician recommends that you continue on your current medications as directed. Please refer to the Current Medication list given to you today.   Labwork: None  Testing/Procedures: None  Follow-Up: Follow up with Dr. McDowell in 6 months.   Any Other Special Instructions Will Be Listed Below (If Applicable).     If you need a refill on your cardiac medications before your next appointment, please call your pharmacy.  

## 2023-02-08 ENCOUNTER — Other Ambulatory Visit: Payer: Self-pay | Admitting: Cardiology

## 2023-03-02 ENCOUNTER — Other Ambulatory Visit: Payer: Self-pay | Admitting: Cardiology

## 2023-03-19 ENCOUNTER — Other Ambulatory Visit: Payer: Self-pay | Admitting: Cardiology

## 2023-04-16 ENCOUNTER — Other Ambulatory Visit: Payer: Self-pay

## 2023-04-16 DIAGNOSIS — Z87440 Personal history of urinary (tract) infections: Secondary | ICD-10-CM

## 2023-04-20 ENCOUNTER — Other Ambulatory Visit
Admission: RE | Admit: 2023-04-20 | Discharge: 2023-04-20 | Disposition: A | Discharge status: Home / Self Care | Payer: Medicare Other | Attending: Urology | Admitting: Urology

## 2023-04-20 ENCOUNTER — Ambulatory Visit (INDEPENDENT_AMBULATORY_CARE_PROVIDER_SITE_OTHER): Payer: Medicare Other | Admitting: Urology

## 2023-04-20 ENCOUNTER — Encounter: Payer: Self-pay | Admitting: Urology

## 2023-04-20 VITALS — BP 160/75 | HR 66 | Ht 64.0 in | Wt 138.6 lb

## 2023-04-20 DIAGNOSIS — Z09 Encounter for follow-up examination after completed treatment for conditions other than malignant neoplasm: Secondary | ICD-10-CM

## 2023-04-20 DIAGNOSIS — N390 Urinary tract infection, site not specified: Secondary | ICD-10-CM

## 2023-04-20 DIAGNOSIS — Z87440 Personal history of urinary (tract) infections: Secondary | ICD-10-CM | POA: Insufficient documentation

## 2023-04-20 DIAGNOSIS — N958 Other specified menopausal and perimenopausal disorders: Secondary | ICD-10-CM

## 2023-04-20 LAB — URINALYSIS, COMPLETE (UACMP) WITH MICROSCOPIC
Bacteria, UA: NONE SEEN
Bilirubin Urine: NEGATIVE
Glucose, UA: NEGATIVE mg/dL
Hgb urine dipstick: NEGATIVE
Ketones, ur: NEGATIVE mg/dL
Leukocytes,Ua: NEGATIVE
Nitrite: NEGATIVE
Protein, ur: NEGATIVE mg/dL
Specific Gravity, Urine: 1.015 (ref 1.005–1.030)
Squamous Epithelial / HPF: NONE SEEN /HPF (ref 0–5)
pH: 7 (ref 5.0–8.0)

## 2023-04-20 MED ORDER — ESTRADIOL 0.1 MG/GM VA CREA: TOPICAL_CREAM | VAGINAL | 12 refills | Status: DC

## 2023-04-20 NOTE — Progress Notes (Signed)
04/20/23 10:45 AM   Alejandra Singh 1941-09-14 [id number]  CC: Recurrent UTI, urinary frequency/urgency  HPI: I saw Alejandra Singh today for the above issues.  She reports 3-4 UTIs in the last few months, I see a single E. coli UTI documented under the media tab.  She has some baseline urgency and frequency, but rare urge incontinence.  UTI symptoms are lower pelvic pain, increased frequency, and foul-smelling urine.  She denies any gross hematuria or flank pain.  No prior cross-sectional imaging to review.  She is primarily water during the day, but some coffee in the morning, and occasionally Diet Coke.  Urinalysis today is completely benign.  Renal function normal with creatinine 0.5, EGFR greater than 60  PMH: Past Medical History:  Diagnosis Date   Arthritis    Essential hypertension    FH: colonic polyps    Frequent PVCs    RV outflow tract   GERD (gastroesophageal reflux disease)    Hyperlipidemia    Hypothyroidism    Iron deficiency anemia    Mild mitral regurgitation    Neuropathy    lower leg   Non-obstructive CAD (coronary artery disease)    a. 08/2015 ETT: Ex time 6:18. No ST/T changes;  b. 09/2016 Cath: LM nl, LAD 50d, small bridging, LCX nl, RCA nl, EF 55-65%-->Med Rx. c. Cor CT 03/2018 calcium score 0, 25-50% LAD.   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   Vertigo    none for several years    Surgical History: Past Surgical History:  Procedure Laterality Date   ANKLE SURGERY Left    Fracture   CARDIAC CATHETERIZATION N/A 09/22/2016   Procedure: Left Heart Cath and Coronary Angiography;  Surgeon: Marykay Lex, MD;  Location: [facility] INVASIVE CV LAB;  Service: Cardiovascular;  Laterality: N/A;   CATARACT EXTRACTION W/PHACO Left 05/25/2022   Procedure: CATARACT EXTRACTION PHACO AND INTRAOCULAR LENS PLACEMENT (IOC) LEFT 5.42 00:47.4;  Surgeon: Nevada Crane, MD;  Location: [facility];  Service:  Ophthalmology;  Laterality: Left;   COLONOSCOPY N/A 08/04/2021   Procedure: COLONOSCOPY;  Surgeon: Jaynie Collins, DO;  Location: [facility] ENDOSCOPY;  Service: Gastroenterology;  Laterality: N/A;   COLONOSCOPY WITH PROPOFOL N/A 07/05/2015   Procedure: COLONOSCOPY WITH PROPOFOL;  Surgeon: Scot Jun, MD;  Location: [facility] ENDOSCOPY;  Service: Endoscopy;  Laterality: N/A;   CYSTOCELE REPAIR N/A 04/29/2020   Procedure: ANTERIOR REPAIR (CYSTOCELE);  Surgeon: Schermerhorn, Ihor Austin, MD;  Location: [facility] ORS;  Service: Gynecology;  Laterality: N/A;   DILATION AND CURETTAGE OF UTERUS     ESOPHAGOGASTRODUODENOSCOPY (EGD) WITH PROPOFOL N/A 03/19/2016   Procedure: ESOPHAGOGASTRODUODENOSCOPY (EGD) WITH PROPOFOL;  Surgeon: Scot Jun, MD;  Location: [facility] ENDOSCOPY;  Service: Endoscopy;  Laterality: N/A;   EYE SURGERY Right 09/17/2004   cataract - [facility]asington - [facility]   TONSILLECTOMY     VAGINAL HYSTERECTOMY N/A 04/29/2020   Procedure: HYSTERECTOMY VAGINAL;  Surgeon: Schermerhorn, Ihor Austin, MD;  Location: [facility] ORS;  Service: Gynecology;  Laterality: N/A;    Family History: Family History  Problem Relation Age of Onset   Alzheimer's disease Mother    Dementia Mother    Stroke Father    Coronary artery disease Neg Hx    Breast cancer Neg Hx     Social History:  reports that she has never smoked. She has never used smokeless tobacco. She reports that she does not drink alcohol and does not use drugs.  Physical Exam: BP (!) 160/75 (BP Location: Left Arm, Patient Position: Sitting, Cuff Size: Normal)   Pulse 66   Ht 5\' 4"  (1.626 m)   Wt 138 lb 9.6 oz (62.9 kg)   BMI 23.79 kg/m    Constitutional:  Alert and oriented, No acute distress. Cardiovascular: No clubbing, cyanosis, or edema. Respiratory: Normal respiratory effort, no increased work of breathing. GI: Abdomen is soft, nontender, nondistended, no abdominal masses   Laboratory Data: Reviewed, see HPI    Assessment & Plan:    82 year old female with reported recurrent UTIs over the last 6 months as well as some mild overactive bladder symptoms.  Regarding her overactive symptoms, I recommended behavioral strategies and avoiding bladder irritants, and I think she would benefit from topical estrogen cream both for the OAB urinary symptoms and recurrent infections.  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  We discussed the evaluation and treatment of patients with recurrent UTIs at length.  We specifically discussed the differences between asymptomatic bacteriuria and true urinary tract infection.  We discussed the AUA definition of recurrent UTI of at least 2 culture proven symptomatic acute cystitis episodes in a 32-month period, or 3 within a 1 year period.  We discussed the importance of culture directed antibiotic treatment, and antibiotic stewardship.  First-line therapy includes nitrofurantoin(5 days), Bactrim(3 days), or fosfomycin(3 g single dose).  Possible etiologies of recurrent infection include periurethral tissue atrophy in postmenopausal woman, constipation, sexual activity, incomplete emptying, anatomic abnormalities, and even genetic predisposition.  Finally, we discussed the role of perineal hygiene, timed voiding, adequate hydration, topical vaginal estrogen, cranberry prophylaxis, and low-dose antibiotic prophylaxis.  Trial of topical estrogen cream and cranberry tablet prophylaxis, RTC 3 to 4 months symptom check  Legrand Rams, MD 04/20/2023  [facility] [address] [place], [place] [address] [phone]

## 2023-06-07 ENCOUNTER — Other Ambulatory Visit
Admission: RE | Admit: 2023-06-07 | Discharge: 2023-06-07 | Disposition: A | Discharge status: Home / Self Care | Payer: Medicare Other | Attending: Urology | Admitting: Urology

## 2023-06-07 ENCOUNTER — Encounter: Payer: Self-pay | Admitting: Urology

## 2023-06-07 ENCOUNTER — Other Ambulatory Visit: Payer: Self-pay | Admitting: Urology

## 2023-06-07 ENCOUNTER — Ambulatory Visit: Payer: Medicare Other | Admitting: Urology

## 2023-06-07 VITALS — BP 148/75 | HR 63 | Ht 64.0 in | Wt 139.0 lb

## 2023-06-07 DIAGNOSIS — R300 Dysuria: Secondary | ICD-10-CM | POA: Diagnosis present

## 2023-06-07 DIAGNOSIS — R3989 Other symptoms and signs involving the genitourinary system: Secondary | ICD-10-CM | POA: Diagnosis not present

## 2023-06-07 LAB — URINALYSIS, COMPLETE (UACMP) WITH MICROSCOPIC
Bilirubin Urine: NEGATIVE
Glucose, UA: NEGATIVE mg/dL
Ketones, ur: NEGATIVE mg/dL
Nitrite: NEGATIVE
Protein, ur: NEGATIVE mg/dL
Specific Gravity, Urine: 1.015 (ref 1.005–1.030)
WBC, UA: >50 WBC/hpf (ref 0–5)
pH: 7 (ref 5.0–8.0)

## 2023-06-07 MED ORDER — CEFUROXIME AXETIL 250 MG PO TABS: 250.0000 mg | ORAL_TABLET | Freq: Two times a day (BID) | ORAL | 0 refills | Status: DC

## 2023-06-07 NOTE — Progress Notes (Signed)
06/07/2023 4:47 PM   Alejandra Singh 12-Nov-1940 [id number]  Referring provider: Center, [facility] [address] [place],  [place] [address]  Urological history: 1. rUTI's -contributing factors of age and GSM -Documented urine cultures over the last year  E. coli February 08, 2023 -Vaginal estrogen cream tablets  2. OAB -Contributing factors of age, GSM and hypertension -Behavioral strategies and vaginal estrogen cream  Chief Complaint  Patient presents with   Dysuria   HPI: Alejandra Singh is a 82 y.o. female who presents today for pain with urination and frequency.  Previous records reviewed.   Yesterday after church, she started to experience lower abdominal pain, frequency, urgency and an uncomfortableness at her urethra.  She is applying the vaginal estrogen cream three nights weekly.  Patient denies any modifying or aggravating factors.  Patient denies any recent UTI's, gross hematuria, dysuria or suprapubic/flank pain.  Patient denies any fevers, chills, nausea or vomiting.   UA yellow cloudy, specific gravity 1.015, pH 7.0, small heme, large leuks, 0-5 squames, > 50 WBC's, 6-10 RBC's and many bacteria.   PMH: Past Medical History:  Diagnosis Date   Arthritis    Essential hypertension    FH: colonic polyps    Frequent PVCs    RV outflow tract   GERD (gastroesophageal reflux disease)    Hyperlipidemia    Hypothyroidism    Iron deficiency anemia    Mild mitral regurgitation    Neuropathy    lower leg   Non-obstructive CAD (coronary artery disease)    a. 08/2015 ETT: Ex time 6:18. No ST/T changes;  b. 09/2016 Cath: LM nl, LAD 50d, small bridging, LCX nl, RCA nl, EF 55-65%-->Med Rx. c. Cor CT 03/2018 calcium score 0, 25-50% LAD.   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   Vertigo    none for several years    Surgical History: Past Surgical History:  Procedure Laterality Date   ANKLE  SURGERY Left    Fracture   CARDIAC CATHETERIZATION N/A 09/22/2016   Procedure: Left Heart Cath and Coronary Angiography;  Surgeon: Marykay Lex, MD;  Location: [facility] INVASIVE CV LAB;  Service: Cardiovascular;  Laterality: N/A;   CATARACT EXTRACTION W/PHACO Left 05/25/2022   Procedure: CATARACT EXTRACTION PHACO AND INTRAOCULAR LENS PLACEMENT (IOC) LEFT 5.42 00:47.4;  Surgeon: Nevada Crane, MD;  Location: [facility];  Service: Ophthalmology;  Laterality: Left;   COLONOSCOPY N/A 08/04/2021   Procedure: COLONOSCOPY;  Surgeon: Jaynie Collins, DO;  Location: [facility] ENDOSCOPY;  Service: Gastroenterology;  Laterality: N/A;   COLONOSCOPY WITH PROPOFOL N/A 07/05/2015   Procedure: COLONOSCOPY WITH PROPOFOL;  Surgeon: Scot Jun, MD;  Location: [facility] ENDOSCOPY;  Service: Endoscopy;  Laterality: N/A;   CYSTOCELE REPAIR N/A 04/29/2020   Procedure: ANTERIOR REPAIR (CYSTOCELE);  Surgeon: Schermerhorn, Ihor Austin, MD;  Location: [facility] ORS;  Service: Gynecology;  Laterality: N/A;   DILATION AND CURETTAGE OF UTERUS     ESOPHAGOGASTRODUODENOSCOPY (EGD) WITH PROPOFOL N/A 03/19/2016   Procedure: ESOPHAGOGASTRODUODENOSCOPY (EGD) WITH PROPOFOL;  Surgeon: Scot Jun, MD;  Location: [facility] ENDOSCOPY;  Service: Endoscopy;  Laterality: N/A;   EYE SURGERY Right 09/17/2004   cataract - [facility]asing[facility] - [facility]   TONSILLECTOMY     VAGINAL HYSTERECTOMY N/A 04/29/2020   Procedure: HYSTERECTOMY VAGINAL;  Surgeon: Schermerhorn, Ihor Austin, MD;  Location: [facility] ORS;  Service: Gynecology;  Laterality: N/A;    Home Medications:  Allergies as of 06/07/2023  Reactions   Alendronate Other (See Comments)   Can't recall reaction        Medication List        Accurate as of June 07, 2023  4:47 PM. If you have any questions, ask your nurse or doctor.          Alpha-Lipoic Acid 200 MG Tabs Take 1 tablet by mouth in the morning, at noon, and at bedtime.   amLODipine 2.5 MG tablet Commonly known  as: NORVASC Take 1 tablet by mouth once daily   aspirin EC 81 MG tablet Take 81 mg by mouth daily.   cefUROXime 250 MG tablet Commonly known as: CEFTIN Take 1 tablet (250 mg total) by mouth 2 (two) times daily with a meal. Started by: Michiel Cowboy   esomeprazole 40 MG capsule Commonly known as: NEXIUM Take 40 mg by mouth daily as needed (reflux).   estradiol 0.1 MG/GM vaginal cream Commonly known as: ESTRACE Estrogen Cream Instruction Discard applicator Apply pea sized amount to tip of finger to urethra before bed. Wash hands well after application. Use Monday, Wednesday and Friday.   flecainide 50 MG tablet Commonly known as: TAMBOCOR Take 1 tablet by mouth twice daily   gabapentin 100 MG capsule Commonly known as: NEURONTIN Take 1 capsule by mouth in the morning and at bedtime.   levothyroxine 50 MCG tablet Commonly known as: SYNTHROID Take 50 mcg by mouth daily before breakfast.   Multivital tablet Take 1 tablet by mouth daily.   rosuvastatin 5 MG tablet Commonly known as: CRESTOR Take 1 tablet by mouth once daily        Allergies:  Allergies  Allergen Reactions   Alendronate Other (See Comments)    Can't recall reaction    Family History: Family History  Problem Relation Age of Onset   Alzheimer's disease Mother    Dementia Mother    Stroke Father    Coronary artery disease Neg Hx    Breast cancer Neg Hx     Social History:  reports that she has never smoked. She has never used smokeless tobacco. She reports that she does not drink alcohol and does not use drugs.  ROS: Pertinent ROS in HPI  Physical Exam: BP (!) 148/75 (BP Location: Left Arm, Patient Position: Sitting, Cuff Size: Normal)   Pulse 63   Ht 5\' 4"  (1.626 m)   Wt 139 lb (63 kg)   BMI 23.86 kg/m   Constitutional:  Well nourished. Alert and oriented, No acute distress. HEENT: [place] AT, moist mucus membranes.  Trachea midline Cardiovascular: No clubbing, cyanosis, or  edema. Respiratory: Normal respiratory effort, no increased work of breathing. Neurologic: Grossly intact, no focal deficits, moving all 4 extremities. Psychiatric: Normal mood and affect.    Laboratory Data: Urinalysis Component     Latest Ref Rng 06/07/2023  Color, Urine     YELLOW  YELLOW   Appearance     CLEAR  CLOUDY !   Specific Gravity, Urine     1.005 - 1.030  1.015   pH     5.0 - 8.0  7.0   Glucose, UA     NEGATIVE mg/dL NEGATIVE   Hgb urine dipstick     NEGATIVE  SMALL !   Bilirubin Urine     NEGATIVE  NEGATIVE   Ketones, ur     NEGATIVE mg/dL NEGATIVE   Protein     NEGATIVE mg/dL NEGATIVE   Nitrite     NEGATIVE  NEGATIVE  Leukocytes,Ua     NEGATIVE  LARGE !   Squamous Epithelial / HPF     0 - 5 /HPF 0-5   WBC, UA     0 - 5 WBC/hpf >50   RBC / HPF     0 - 5 RBC/hpf 6-10   Bacteria, UA     NONE SEEN  MANY !     Legend: ! Abnormal I have reviewed the labs.   Pertinent Imaging: N/A  Assessment & Plan:    1.  Suspected UTI -UA w/ pyuria, hematuria and bacteruria  -urine culture pending -Ceftin 250 mg twice daily for seven days, will adjust if necessary once culture results are received  Return for pending urine culture results .  These notes generated with voice recognition software. I apologize for typographical errors.  Cloretta Ned  [facility] [address]  [address] [place], [place] [address] [phone]

## 2023-06-08 LAB — URINE CULTURE: Culture: >=100000 — AB

## 2023-06-19 ENCOUNTER — Other Ambulatory Visit: Payer: Self-pay | Admitting: Cardiology

## 2023-06-28 ENCOUNTER — Other Ambulatory Visit: Payer: Self-pay | Admitting: Family Medicine

## 2023-06-28 DIAGNOSIS — Z1231 Encounter for screening mammogram for malignant neoplasm of breast: Secondary | ICD-10-CM

## 2023-07-14 ENCOUNTER — Ambulatory Visit
Admission: RE | Admit: 2023-07-14 | Discharge: 2023-07-14 | Disposition: A | Discharge status: Home / Self Care | Payer: Medicare Other | Source: Ambulatory Visit | Attending: Family Medicine | Admitting: Family Medicine

## 2023-07-14 DIAGNOSIS — Z1231 Encounter for screening mammogram for malignant neoplasm of breast: Secondary | ICD-10-CM | POA: Insufficient documentation

## 2023-07-26 ENCOUNTER — Encounter: Payer: Self-pay | Admitting: Cardiology

## 2023-07-26 ENCOUNTER — Encounter: Payer: Self-pay | Admitting: *Deleted

## 2023-07-26 ENCOUNTER — Ambulatory Visit: Payer: Medicare Other | Attending: Cardiology | Admitting: Cardiology

## 2023-07-26 VITALS — BP 148/90 | HR 60 | Ht 64.0 in | Wt 138.2 lb

## 2023-07-26 DIAGNOSIS — R5383 Other fatigue: Secondary | ICD-10-CM | POA: Diagnosis present

## 2023-07-26 DIAGNOSIS — I10 Essential (primary) hypertension: Secondary | ICD-10-CM | POA: Diagnosis present

## 2023-07-26 DIAGNOSIS — I2510 Atherosclerotic heart disease of native coronary artery without angina pectoris: Secondary | ICD-10-CM

## 2023-07-26 DIAGNOSIS — R29898 Other symptoms and signs involving the musculoskeletal system: Secondary | ICD-10-CM

## 2023-07-26 DIAGNOSIS — I493 Ventricular premature depolarization: Secondary | ICD-10-CM | POA: Diagnosis not present

## 2023-07-26 MED ORDER — AMLODIPINE BESYLATE 5 MG PO TABS: 5.0000 mg | ORAL_TABLET | Freq: Every day | ORAL | 2 refills | Status: DC

## 2023-07-26 NOTE — Progress Notes (Signed)
    Cardiology Office Note  Date: 07/26/2023   ID: Alejandra Singh, DOB August 30, 1941, MRN [id number]  History of Present Illness: Alejandra Singh is an 82 y.o. female last seen in April.  She is here today for a routine visit.  Reports no palpitations or syncope.  No exertional chest pain but does experience exertional fatigue and weakness.  Also to some degree has had weakness in her legs with activity.  No clear-cut claudication.  I reviewed her medications, she reports compliance with therapy.  Systolic blood pressures running 130s to 140s at home.  Diastolics consistently under 80.  We discussed up titration of Norvasc.  She is also on aspirin, flecainide, and Crestor.  LDL 69 in August.  ECG today shows sinus bradycardia with nonspecific ST changes, QTc 406 ms.  Physical Exam: VS:  BP (!) 148/90 (BP Location: Right Arm, Cuff Size: Normal)   Pulse 60   Ht 5\' 4"  (1.626 m)   Wt 138 lb 3.2 oz (62.7 kg)   SpO2 97%   BMI 23.72 kg/m , BMI Body mass index is 23.72 kg/m.  Wt Readings from Last 3 Encounters:  07/26/23 138 lb 3.2 oz (62.7 kg)  06/07/23 139 lb (63 kg)  04/20/23 138 lb 9.6 oz (62.9 kg)    General: Patient appears comfortable at rest. HEENT: Conjunctiva and lids normal. Neck: Supple, no elevated JVP or carotid bruits. Lungs: Clear to auscultation, nonlabored breathing at rest. Cardiac: Regular rate and rhythm, no S3 or significant systolic murmur, no pericardial rub. Abdomen: Soft, nontender, bowel sounds present. Extremities: No pitting edema.  ECG:  An ECG dated 07/15/2022 was personally reviewed today and demonstrated:  Sinus bradycardia with borderline prolonged PR interval and nonspecific ST changes, normal QTc.  Labwork:  August 2024: BUN 12, creatinine 0.51, potassium 4.2, cholesterol 157, triglycerides 83, HDL 72, LDL 69, TSH 2.04  Other Studies Reviewed Today:  No interval cardiac testing for review today.  Assessment and Plan:  1.  Mild nonobstructive  CAD assessed by coronary CTA in 2019 with calcium score of 0 and 25 to 50% LAD stenosis status been managed medically.  She reports exertional fatigue and weakness, no definite angina, but important to exclude progressive obstructive CAD in light of continuing use of flecainide.  We will arrange a Lexiscan Myoview for surveillance.  ECG shows no acute changes.  2.  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  She remains on flecainide.  LVEF 60 to 65% by echocardiogram in August 2022.  She is asymptomatic at this time.  3.  Primary hypertension.  Stage II hypertension by recent measurements.  Increase Norvasc to 5 mg daily and continue to track blood pressure at home.  Disposition:  Follow up  6 months.  Signed, Jonelle Sidle, M.D., F.A.C.C. [facility]

## 2023-07-26 NOTE — Patient Instructions (Addendum)
Medication Instructions:  Your physician has recommended you make the following change in your medication:  Increase amlodipine to 5 mg daily Continue all other medications as prescribed  Labwork: none  Testing/Procedures: Your physician has requested that you have a lexiscan myoview. For further information please visit [email or web address] Please follow instruction sheet, as given. Your physician has requested that you have an ankle brachial index (ABI). During this test an ultrasound and blood pressure cuff are used to evaluate the arteries that supply the arms and legs with blood. Allow thirty minutes for this exam. There are no restrictions or special instructions.  Follow-Up: Your physician recommends that you schedule a follow-up appointment in: 6 months  Any Other Special Instructions Will Be Listed Below (If Applicable).  If you need a refill on your cardiac medications before your next appointment, please call your pharmacy.

## 2023-07-28 ENCOUNTER — Ambulatory Visit (HOSPITAL_COMMUNITY)
Admission: RE | Admit: 2023-07-28 | Discharge: 2023-07-28 | Disposition: A | Discharge status: Home / Self Care | Payer: Medicare Other | Source: Ambulatory Visit | Attending: Cardiology | Admitting: Cardiology

## 2023-07-28 DIAGNOSIS — R29898 Other symptoms and signs involving the musculoskeletal system: Secondary | ICD-10-CM | POA: Diagnosis present

## 2023-08-04 ENCOUNTER — Telehealth (HOSPITAL_COMMUNITY): Payer: Self-pay | Admitting: Surgery

## 2023-08-04 NOTE — Telephone Encounter (Signed)
I called the pt to remind her of her stress test tomorrow at 9:30.  The pt was instructed to check in at the front desk, to wear comfortable clothes, to not eat/drink 6 hours prior to the test (no caffeine or chocolate), and to not smoke 8 hours prior to the test.  She verbalized understanding of these instructions.

## 2023-08-05 ENCOUNTER — Ambulatory Visit (HOSPITAL_COMMUNITY)
Admission: RE | Admit: 2023-08-05 | Discharge: 2023-08-05 | Disposition: A | Discharge status: Home / Self Care | Payer: Medicare Other | Source: Ambulatory Visit | Attending: Cardiology | Admitting: Cardiology

## 2023-08-05 ENCOUNTER — Encounter (HOSPITAL_COMMUNITY)
Admission: RE | Admit: 2023-08-05 | Discharge: 2023-08-05 | Disposition: A | Discharge status: Home / Self Care | Payer: Medicare Other | Source: Ambulatory Visit | Attending: Cardiology | Admitting: Cardiology

## 2023-08-05 ENCOUNTER — Encounter (HOSPITAL_COMMUNITY): Payer: Self-pay

## 2023-08-05 DIAGNOSIS — I2510 Atherosclerotic heart disease of native coronary artery without angina pectoris: Secondary | ICD-10-CM

## 2023-08-05 DIAGNOSIS — R5383 Other fatigue: Secondary | ICD-10-CM | POA: Diagnosis present

## 2023-08-05 LAB — NM MYOCAR MULTI W/SPECT W/WALL MOTION / EF
LV dias vol: 67 mL (ref 46–106)
LV sys vol: 18 mL
Nuc Stress EF: 74 %
Peak HR: 94 {beats}/min
RATE: 0.4
Rest HR: 57 {beats}/min
Rest Nuclear Isotope Dose: 11 mCi
SDS: 4
SRS: 4
SSS: 8
Stress Nuclear Isotope Dose: 32.5 mCi
TID: 1.01

## 2023-08-05 MED ORDER — SODIUM CHLORIDE FLUSH 0.9 % IV SOLN
INTRAVENOUS | Status: AC
  Administered 2023-08-05: 10 mL via INTRAVENOUS
  Filled 2023-08-05: qty 10

## 2023-08-05 MED ORDER — TECHNETIUM TC 99M TETROFOSMIN IV KIT
30.0000 | PACK | Freq: Once | INTRAVENOUS | Status: AC | PRN
  Administered 2023-08-05: 32.5 via INTRAVENOUS

## 2023-08-05 MED ORDER — REGADENOSON 0.4 MG/5ML IV SOLN
INTRAVENOUS | Status: AC
  Administered 2023-08-05: 0.4 mg via INTRAVENOUS
  Filled 2023-08-05: qty 5

## 2023-08-05 MED ORDER — TECHNETIUM TC 99M TETROFOSMIN IV KIT
10.0000 | PACK | Freq: Once | INTRAVENOUS | Status: AC | PRN
  Administered 2023-08-05: 11 via INTRAVENOUS

## 2023-08-18 ENCOUNTER — Other Ambulatory Visit: Payer: Self-pay | Admitting: Urology

## 2023-08-18 DIAGNOSIS — R3129 Other microscopic hematuria: Secondary | ICD-10-CM

## 2023-08-18 NOTE — Progress Notes (Signed)
08/23/2023 9:52 AM   Alejandra Singh [birth year] [id number]  Referring provider: Center, [facility] [address] [place],  [place] [address]  Urological history: 1. rUTI's -contributing factors of age and GSM -Documented urine cultures over the last year  Group B Strep (S. Agalactiea)  E. coli February 08, 2023 -Vaginal estrogen cream   2. OAB -Contributing factors of age, GSM and hypertension -Behavioral strategies and vaginal estrogen cream  Chief Complaint  Patient presents with   Recurrent UTI   HPI: Alejandra Singh is a 82 y.o. female who presents today for follow up.   Previous records reviewed.   She is having 1-7 daytime voids, 1-2 episodes of nocturia with a mild urge to urinate.  She is having urinary leakage.  She has some mild stress incontinence when she tries to postpone urination.  She leaks 1-2 times a week.  She does wear panty liner daily, but only when she goes to church.  She does not engage in toilet mapping and she does not limit her fluid intake.  Patient denies any modifying or aggravating factors.  Patient denies any recent UTI's, gross hematuria, dysuria or suprapubic/flank pain.  Patient denies any fevers, chills, nausea or vomiting.    UA negative  PVR 223 mL   She is applying the vaginal estrogen cream 3 nights weekly.  She states that she feels it is made a great difference and will continue applying.   PMH: Past Medical History:  Diagnosis Date   Arthritis    Essential hypertension    FH: colonic polyps    Frequent PVCs    RV outflow tract   GERD (gastroesophageal reflux disease)    Hyperlipidemia    Hypothyroidism    Iron deficiency anemia    Mild mitral regurgitation    Neuropathy    lower leg   Non-obstructive CAD (coronary artery disease)    a. 08/2015 ETT: Ex time 6:18. No ST/T changes;  b. 09/2016 Cath: LM nl, LAD 50d, small bridging, LCX nl, RCA nl, EF 55-65%-->Med Rx. c. Cor CT 03/2018 calcium score 0,  25-50% LAD.   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   Vertigo    none for several years    Surgical History: Past Surgical History:  Procedure Laterality Date   ANKLE SURGERY Left    Fracture   CARDIAC CATHETERIZATION N/A 09/22/2016   Procedure: Left Heart Cath and Coronary Angiography;  Surgeon: Marykay Lex, MD;  Location: [facility] INVASIVE CV LAB;  Service: Cardiovascular;  Laterality: N/A;   CATARACT EXTRACTION W/PHACO Left 05/25/2022   Procedure: CATARACT EXTRACTION PHACO AND INTRAOCULAR LENS PLACEMENT (IOC) LEFT 5.42 00:47.4;  Surgeon: Nevada Crane, MD;  Location: [facility];  Service: Ophthalmology;  Laterality: Left;   COLONOSCOPY N/A 08/04/2021   Procedure: COLONOSCOPY;  Surgeon: Jaynie Collins, DO;  Location: [facility] ENDOSCOPY;  Service: Gastroenterology;  Laterality: N/A;   COLONOSCOPY WITH PROPOFOL N/A 07/05/2015   Procedure: COLONOSCOPY WITH PROPOFOL;  Surgeon: Scot Jun, MD;  Location: [facility] ENDOSCOPY;  Service: Endoscopy;  Laterality: N/A;   CYSTOCELE REPAIR N/A 04/29/2020   Procedure: ANTERIOR REPAIR (CYSTOCELE);  Surgeon: Schermerhorn, Ihor Austin, MD;  Location: [facility] ORS;  Service: Gynecology;  Laterality: N/A;   DILATION AND CURETTAGE OF UTERUS     ESOPHAGOGASTRODUODENOSCOPY (EGD) WITH PROPOFOL N/A 03/19/2016   Procedure: ESOPHAGOGASTRODUODENOSCOPY (EGD) WITH PROPOFOL;  Surgeon: Scot Jun, MD;  Location: [facility]  ENDOSCOPY;  Service: Endoscopy;  Laterality: N/A;   EYE SURGERY Right 09/17/2004   cataract - [facility] - [facility]   TONSILLECTOMY     VAGINAL HYSTERECTOMY N/A 04/29/2020   Procedure: HYSTERECTOMY VAGINAL;  Surgeon: Schermerhorn, Ihor Austin, MD;  Location: [facility] ORS;  Service: Gynecology;  Laterality: N/A;    Home Medications:  Allergies as of 08/23/2023       Reactions   Alendronate Other (See Comments)   Can't recall reaction        Medication List        Accurate as  of August 23, 2023  9:52 AM. If you have any questions, ask your nurse or doctor.          Alpha-Lipoic Acid 200 MG Tabs Take 1 tablet by mouth in the morning, at noon, and at bedtime.   amLODipine 5 MG tablet Commonly known as: NORVASC Take 1 tablet (5 mg total) by mouth daily.   aspirin EC 81 MG tablet Take 81 mg by mouth daily.   esomeprazole 40 MG capsule Commonly known as: NEXIUM Take 40 mg by mouth daily as needed (reflux).   estradiol 0.1 MG/GM vaginal cream Commonly known as: ESTRACE Estrogen Cream Instruction Discard applicator Apply pea sized amount to tip of finger to urethra before bed. Wash hands well after application. Use Monday, Wednesday and Friday.   flecainide 50 MG tablet Commonly known as: TAMBOCOR Take 1 tablet by mouth twice daily   gabapentin 100 MG capsule Commonly known as: NEURONTIN Take 1 capsule by mouth in the morning and at bedtime.   levothyroxine 50 MCG tablet Commonly known as: SYNTHROID Take 50 mcg by mouth daily before breakfast.   Multivital tablet Take 1 tablet by mouth daily.   rosuvastatin 5 MG tablet Commonly known as: CRESTOR Take 1 tablet by mouth once daily        Allergies:  Allergies  Allergen Reactions   Alendronate Other (See Comments)    Can't recall reaction    Family History: Family History  Problem Relation Age of Onset   Alzheimer's disease Mother    Dementia Mother    Stroke Father    Coronary artery disease Neg Hx    Breast cancer Neg Hx     Social History:  reports that she has never smoked. She has never used smokeless tobacco. She reports that she does not drink alcohol and does not use drugs.  ROS: Pertinent ROS in HPI  Physical Exam: BP (!) 148/79 (BP Location: Left Arm, Patient Position: Sitting, Cuff Size: Normal)   Pulse 76   Ht 5\' 4"  (1.626 m)   Wt 138 lb (62.6 kg)   BMI 23.69 kg/m   Constitutional:  Well nourished. Alert and oriented, No acute distress. HEENT: [place] AT, moist  mucus membranes.  Trachea midline Cardiovascular: No clubbing, cyanosis, or edema. Respiratory: Normal respiratory effort, no increased work of breathing. Neurologic: Grossly intact, no focal deficits, moving all 4 extremities. Psychiatric: Normal mood and affect.    Laboratory Data: Urinalysis: Component     Latest Ref Rng 08/23/2023  Color, Urine     YELLOW  YELLOW   Appearance     CLEAR  CLEAR   Specific Gravity, Urine     1.005 - 1.030  1.010   pH     5.0 - 8.0  7.0   Glucose, UA     NEGATIVE mg/dL NEGATIVE   Hgb urine dipstick     NEGATIVE  NEGATIVE   Bilirubin Urine  NEGATIVE  NEGATIVE   Ketones, ur     NEGATIVE mg/dL NEGATIVE   Protein     NEGATIVE mg/dL NEGATIVE   Nitrite     NEGATIVE  NEGATIVE   Leukocytes,Ua     NEGATIVE  NEGATIVE   Squamous Epithelial / HPF     0 - 5 /HPF 0-5   WBC, UA     0 - 5 WBC/hpf NONE SEEN   RBC / HPF     0 - 5 RBC/hpf 0-5   Bacteria, UA     NONE SEEN  NONE SEEN   I have reviewed the labs.   Pertinent Imaging:  08/23/23 09:40  Scan Result   Assessment & Plan:    1.  rUTI's -Asymptomatic at today's visit -UA is clear - criteria for recurrent UTI has been met with 2 or more infections in 6 months or 3 or greater infections in one year  -Continue vaginal estrogen cream 3 nights weekly -Advised to notify us when she has symptoms of UTI so that we can obtain urinalysis and send urine for culture  2. OAB -minimal bother -Continue vaginal estrogen cream 3 nights weekly  3. Incomplete bladder emptying -Asymptomatic -Continue to monitor                                               Return in about 1 year (around 08/22/2024) for OAB and PVR .  These notes generated with voice recognition software. I apologize for typographical errors.  Cloretta Ned  [facility] [address]  [address] [place], [place] [address] [phone]

## 2023-08-23 ENCOUNTER — Encounter: Payer: Self-pay | Admitting: Urology

## 2023-08-23 ENCOUNTER — Ambulatory Visit: Payer: Medicare Other | Admitting: Urology

## 2023-08-23 ENCOUNTER — Other Ambulatory Visit
Admission: RE | Admit: 2023-08-23 | Discharge: 2023-08-23 | Disposition: A | Discharge status: Home / Self Care | Payer: Medicare Other | Attending: Urology | Admitting: Urology

## 2023-08-23 VITALS — BP 148/79 | HR 76 | Ht 64.0 in | Wt 138.0 lb

## 2023-08-23 DIAGNOSIS — R339 Retention of urine, unspecified: Secondary | ICD-10-CM | POA: Diagnosis not present

## 2023-08-23 DIAGNOSIS — R3129 Other microscopic hematuria: Secondary | ICD-10-CM | POA: Diagnosis present

## 2023-08-23 DIAGNOSIS — N3281 Overactive bladder: Secondary | ICD-10-CM

## 2023-08-23 DIAGNOSIS — N390 Urinary tract infection, site not specified: Secondary | ICD-10-CM

## 2023-08-23 DIAGNOSIS — Z87440 Personal history of urinary (tract) infections: Secondary | ICD-10-CM | POA: Diagnosis not present

## 2023-08-23 LAB — URINALYSIS, COMPLETE (UACMP) WITH MICROSCOPIC
Bacteria, UA: NONE SEEN
Bilirubin Urine: NEGATIVE
Glucose, UA: NEGATIVE mg/dL
Hgb urine dipstick: NEGATIVE
Ketones, ur: NEGATIVE mg/dL
Leukocytes,Ua: NEGATIVE
Nitrite: NEGATIVE
Protein, ur: NEGATIVE mg/dL
Specific Gravity, Urine: 1.01 (ref 1.005–1.030)
WBC, UA: NONE SEEN WBC/hpf (ref 0–5)
pH: 7 (ref 5.0–8.0)

## 2023-08-23 LAB — BLADDER SCAN AMB NON-IMAGING

## 2023-11-08 ENCOUNTER — Other Ambulatory Visit: Payer: Self-pay | Admitting: Cardiology

## 2023-11-22 ENCOUNTER — Ambulatory Visit: Payer: Medicare Other | Attending: Student | Admitting: Physical Therapy

## 2023-11-22 ENCOUNTER — Other Ambulatory Visit (INDEPENDENT_AMBULATORY_CARE_PROVIDER_SITE_OTHER): Payer: Self-pay | Admitting: Nurse Practitioner

## 2023-11-22 DIAGNOSIS — M6281 Muscle weakness (generalized): Secondary | ICD-10-CM | POA: Insufficient documentation

## 2023-11-22 DIAGNOSIS — R293 Abnormal posture: Secondary | ICD-10-CM | POA: Diagnosis present

## 2023-11-22 DIAGNOSIS — I8390 Asymptomatic varicose veins of unspecified lower extremity: Secondary | ICD-10-CM

## 2023-11-22 DIAGNOSIS — R2689 Other abnormalities of gait and mobility: Secondary | ICD-10-CM | POA: Diagnosis present

## 2023-11-22 NOTE — Therapy (Unsigned)
OUTPATIENT PHYSICAL THERAPY LOWER EXTREMITY EVALUATION   Patient Name: HEBAH BOGOSIAN MRN: [id number] DOB:12/02/1940, 83 y.o., female Today's Date: 11/23/2023  END OF SESSION:  PT End of Session - 11/22/23 0945     Visit Number 1    Number of Visits 16    Date for PT Re-Evaluation 01/17/24    PT Start Time 0940    PT Stop Time 1035    PT Time Calculation (min) 55 min    Equipment Utilized During Treatment Gait belt    Activity Tolerance Patient tolerated treatment well    Behavior During Therapy WFL for tasks assessed/performed             Past Medical History:  Diagnosis Date   Arthritis    Essential hypertension    FH: colonic polyps    Frequent PVCs    RV outflow tract   GERD (gastroesophageal reflux disease)    Hyperlipidemia    Hypothyroidism    Iron deficiency anemia    Mild mitral regurgitation    Neuropathy    lower leg   Non-obstructive CAD (coronary artery disease)    a. 08/2015 ETT: Ex time 6:18. No ST/T changes;  b. 09/2016 Cath: LM nl, LAD 50d, small bridging, LCX nl, RCA nl, EF 55-65%-->Med Rx. c. Cor CT 03/2018 calcium score 0, 25-50% LAD.   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   Vertigo    none for several years   Past Surgical History:  Procedure Laterality Date   ANKLE SURGERY Left    Fracture   CARDIAC CATHETERIZATION N/A 09/22/2016   Procedure: Left Heart Cath and Coronary Angiography;  Surgeon: Marykay Lex, MD;  Location: [facility] INVASIVE CV LAB;  Service: Cardiovascular;  Laterality: N/A;   CATARACT EXTRACTION W/PHACO Left 05/25/2022   Procedure: CATARACT EXTRACTION PHACO AND INTRAOCULAR LENS PLACEMENT (IOC) LEFT 5.42 00:47.4;  Surgeon: Nevada Crane, MD;  Location: [facility];  Service: Ophthalmology;  Laterality: Left;   COLONOSCOPY N/A 08/04/2021   Procedure: COLONOSCOPY;  Surgeon: Jaynie Collins, DO;  Location: [facility] ENDOSCOPY;  Service: Gastroenterology;   Laterality: N/A;   COLONOSCOPY WITH PROPOFOL N/A 07/05/2015   Procedure: COLONOSCOPY WITH PROPOFOL;  Surgeon: Scot Jun, MD;  Location: [facility] ENDOSCOPY;  Service: Endoscopy;  Laterality: N/A;   CYSTOCELE REPAIR N/A 04/29/2020   Procedure: ANTERIOR REPAIR (CYSTOCELE);  Surgeon: Schermerhorn, Ihor Austin, MD;  Location: [facility] ORS;  Service: Gynecology;  Laterality: N/A;   DILATION AND CURETTAGE OF UTERUS     ESOPHAGOGASTRODUODENOSCOPY (EGD) WITH PROPOFOL N/A 03/19/2016   Procedure: ESOPHAGOGASTRODUODENOSCOPY (EGD) WITH PROPOFOL;  Surgeon: Scot Jun, MD;  Location: [facility] ENDOSCOPY;  Service: Endoscopy;  Laterality: N/A;   EYE SURGERY Right 09/17/2004   cataract - [facility] - [facility]   TONSILLECTOMY     VAGINAL HYSTERECTOMY N/A 04/29/2020   Procedure: HYSTERECTOMY VAGINAL;  Surgeon: Schermerhorn, Ihor Austin, MD;  Location: [facility] ORS;  Service: Gynecology;  Laterality: N/A;   Patient Active Problem List   Diagnosis Date Noted   Other abnormalities of gait and mobility 11/22/2023   Muscle weakness (generalized) 11/22/2023   Abnormal posture 11/22/2023   Frequent PVCs 03/28/2018   Chest discomfort 03/26/2018   Bradycardia with 31-40 beats per minute 09/21/2016   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   THERAPY DIAG:  Other abnormalities of gait and mobility  Muscle weakness (generalized)  Abnormal posture  Rationale for Evaluation and Treatment: Rehabilitation  ONSET DATE: Chronic  SUBJECTIVE:   SUBJECTIVE STATEMENT:  Pt. Arrives to PT appointment on time and prepared for their evaluation using a SPC.  Pt. Describes that they have experienced difficulties related to maintaining their balance after standing up after prolonged instances of sitting. Pt. Explains that they regularly experience these episodes of dizziness during church, and fear having a fall in public settings. Pt. Expresses that they have a great support group within their church community, but they wish to progress towards returning to ambulating with no AD or the LRAD. Pt. States that they transitioned to using a SPC after having several episodes of unsteadiness on their feet. Pt. Discusses their PMH related to having peripheral neuropathy, with symptoms being worse on the LLE. Pt. Notes that that they are not experiencing any related neuropathic pain 0/10 NPS. Pt. Elaborates that their PLOF was independent with all ADLs/IADLs but they are now challenged by forward reaching/bending activities such as putting away laundry. Pt. Expresses that they would like to exercise more and be able to transition from sitting to standing in church independently and without LOB. Pt. Also notes that they have recently stopped taking their 100 mg Gabapentin and have not felt the need to return to their previous dosage.  PERTINENT HISTORY: LE Neuropathy, LLE more affected than RLE PAIN:  Are you having pain? No  PRECAUTIONS: Fall  RED FLAGS: None   WEIGHT BEARING RESTRICTIONS: No  FALLS:  Has patient fallen in last 6 months? Yes. Number of falls 2  LIVING ENVIRONMENT: Lives with: lives with their family and lives with their spouse Lives in: House/apartment Stairs: Yes: Internal: 14 steps; on right going up Has following equipment at home: Single point cane  OCCUPATION: Retired  PLOF: Independent  PATIENT GOALS:   - Reduce episodes of transitional LOB - Increase weekly exercise frequency - Manage neuropathic symptoms - Return to forward-reaching yard-work activities such as planting flowers - Return to being independent with all  ADLs/IADLs - Increase confidence ambulating within the community  NEXT MD VISIT: N/A  OBJECTIVE:  Note: Objective measures were completed at Evaluation unless otherwise noted.  DIAGNOSTIC FINDINGS:  No imaging  - B LE Weakness - Proximal hip stabilization musculature weakness - Ataxic gait, limited stride length and step width - MCI of B LE related to neuropathy - Decreased sensation to B LE at L4, L5, and S1 dermatomes  PATIENT SURVEYS:  LEFS 44/80  COGNITION: Overall cognitive status: Within functional limits for tasks assessed     SENSATION: Light touch: Impaired   EDEMA:  NT  MUSCLE LENGTH: Hamstrings: Right 85 deg; Left 87 deg (Straight leg raise)   POSTURE: anterior pelvic tilt  PALPATION: Minimal distal LE swelling, no signs of pitting edema.  LOWER EXTREMITY ROM:  Active ROM Right eval Left eval  Hip flexion 85 87  Hip extension    Hip abduction    Hip adduction WNL WNL  Hip internal rotation WNL WNL  Hip external rotation    Knee flexion 123 120  Knee extension 0 0  Ankle dorsiflexion 3 0  Ankle plantarflexion 40 35  Ankle inversion    Ankle eversion     (  Blank rows = not tested)  LOWER EXTREMITY MMT:  MMT Right eval Left eval  Hip flexion 3 3  Hip extension 3 3  Hip abduction 3 3  Hip adduction    Hip internal rotation 3 3  Hip external rotation 3 3  Knee flexion 4 4  Knee extension 4 4  Ankle dorsiflexion 3 3  Ankle plantarflexion 4 4  Ankle inversion 3 3  Ankle eversion 3 3   (Blank rows = not tested)  LOWER EXTREMITY SPECIAL TESTS:  Hip special tests: FADDIR (negative)  FUNCTIONAL TESTS:  5 times sit to stand: 15.2 seconds with a LOB episode on repetition 4. Pt. Recovered by sitting back in chair and completed rep 5.  GAIT: Distance walked: 50 ft. Assistive device utilized: Single point cane Level of assistance: Complete Independence Comments: Pt. Demonstrates decreased stride length, increased step width, decreased hip  flexion, an anterior pelvic tilt, forward scapulothoracic posture, and lacks extension during toe-off phase of gait cycle.                                                                                                                              TREATMENT DATE: 11/23/2023  Assigned HEP. See below.   PATIENT EDUCATION:  Education details: Pt. Education related to postural corrections of anterior pelvic tilt, the recovery step during LOB episodes, and increasing ground clearance during gait Person educated: Patient Education method: Demonstration Education comprehension: verbalized understanding  HOME EXERCISE PROGRAM: Access Code: [email or web address] URL: [email or web address] Date: 11/22/2023 Prepared by: Dorene Grebe  Exercises - Side Stepping with Resistance at Ankles and Counter Support  - 1 x daily - 7 x weekly - 3 sets - 10 reps - Standing Hip Extension with Resistance at Ankles and Counter Support  - 1 x daily - 7 x weekly - 3 sets - 10 reps - Mini Squat with Counter Support  - 1 x daily - 7 x weekly - 3 sets - 10 reps - Long Sitting Ankle Plantar Flexion with Resistance  - 1 x daily - 7 x weekly - 3 sets - 10 reps  ASSESSMENT:  CLINICAL IMPRESSION: Patient is a 83 y.o. female who was seen today for physical therapy evaluation and treatment for episodes of LOB when transitioning from sitting to standing. Pt. Arrived on time and was very attentive during their evaluation. Pt. Demonstrated weaknesses of BLE related to neuropathy, specifically with hip flexion, hip abduction, and dorsiflexion. The Pts. LLE exhibits more generalized muscle weakness than the RLE, and the pt. showcased a trendelenburg gait favoring their LLE during gait assessment. After assessing strength and ROM for BLE, pt. Performed a 5 times sit-to-stand and experienced a LOB on their 4th repetition which reinforced their need to improve their fall recovery strategies. Pt. Demonstrates difficulty finding their  footing when first standing, and would benefit from LE proprioception training. Overall pt. Is a candidate to be treated through PT for these deficits they have a  good treatment prognosis. Pt. Reiterated that they understand the focus of their current PT treatment plan and how it will progress over the following 8 weeks. Pt. Confirmed that they would be able to participate in 1-2x sessions of PT treatments per week for a duration of 8 weeks. Pt. Will benefit from skilled physical therapy interventions focused on improving balance impairments to return them to participating in IADLs/ADLs independently with LRAD.  OBJECTIVE IMPAIRMENTS: decreased strength.   ACTIVITY LIMITATIONS:  Transitioning from sitting to standing quickly  PARTICIPATION LIMITATIONS: church  PERSONAL FACTORS:  Neuropathy progression  is also affecting patient's functional outcome.   REHAB POTENTIAL: Good  CLINICAL DECISION MAKING: Evolving/moderate complexity  EVALUATION COMPLEXITY: Moderate   GOALS: Goals reviewed with patient? Yes  SHORT TERM GOALS: Target date: 12/06/2023  Pt. Will be able to progress their exercise participation to 3x per week for a duration of 50 minutes to improve their global endurance and reduce muscle fatigue when ambulating within the community.  Baseline: Pt. Exercises 0 times per week outside of PT treatment Goal status: INITIAL  2.  Pt. Will demonstrate anterior pelvic tilt postural correction without cuing to improve proximal hip muscular recruitment and increase LE stability during both static/dynamic ADLs/IADLs.  Baseline: Unable to self correct without demonstration Goal status: INITIAL    LONG TERM GOALS: Target date: 01/17/2024  Pt. Will be able to walk 364ft. with LRAD and 0 LOB episodes to improve pt. Confidence when running errands in town. Baseline:  Goal status: INITIAL  2.  Pt. Will be able to demonstrate a strong/stable first recovery step during LOB episodes to  decrease injury risk associated with forward falls such as a FOOSH. Baseline:  Goal status: INITIAL  3.  Pt. Will reduce their five time sit to stand time from 15.2 seconds to 12 seconds without LOB to demonstrate improved motor coordination with positional transitions. Baseline: 15.2 Seconds with 1 LOB Goal status: INITIAL  4.  Pt. Will increase their B forward reach from 7.5" to 10" to demonstrate improvements in global UE balance/proprioception during ADLs/IADLs. Baseline: 7.5" Bilaterally Goal status: INITIAL    PLAN:  PT FREQUENCY: 1-2x/week  PT DURATION: 8 weeks  PLANNED INTERVENTIONS: 97110-Therapeutic exercises, 97530- Therapeutic activity, [id number]- Neuromuscular re-education, 97535- Self Care, and 56213- Manual therapy  PLAN FOR NEXT SESSION: Reassess current HEP, provide pt. education on LE/UE postural corrections in further detail. Assess BERG or Tinetti   Cammie Mcgee, PT, DPT # [phone] 11/23/2023, 9:23 AM

## 2023-11-24 ENCOUNTER — Ambulatory Visit: Payer: Medicare Other | Admitting: Physical Therapy

## 2023-11-24 ENCOUNTER — Ambulatory Visit (INDEPENDENT_AMBULATORY_CARE_PROVIDER_SITE_OTHER): Payer: Medicare Other | Admitting: Nurse Practitioner

## 2023-11-24 ENCOUNTER — Ambulatory Visit (INDEPENDENT_AMBULATORY_CARE_PROVIDER_SITE_OTHER): Payer: Medicare Other

## 2023-11-24 ENCOUNTER — Encounter (INDEPENDENT_AMBULATORY_CARE_PROVIDER_SITE_OTHER): Payer: Self-pay | Admitting: Nurse Practitioner

## 2023-11-24 VITALS — BP 145/82 | HR 76 | Resp 18 | Ht 64.0 in | Wt 140.8 lb

## 2023-11-24 DIAGNOSIS — I10 Essential (primary) hypertension: Secondary | ICD-10-CM

## 2023-11-24 DIAGNOSIS — I8390 Asymptomatic varicose veins of unspecified lower extremity: Secondary | ICD-10-CM

## 2023-11-24 DIAGNOSIS — E782 Mixed hyperlipidemia: Secondary | ICD-10-CM

## 2023-11-24 NOTE — Progress Notes (Signed)
 Subjective:    Patient ID: Alejandra Singh, female    DOB: 11-27-1940, 83 y.o.   MRN: [id number] Chief Complaint  Patient presents with   New Patient (Initial Visit)     NP. reflux/consult. varicose veins. paich    The patient is a 83 year old female who presents today for evaluation of varicosities.  She has notable varicosities bilaterally.  She does have some swelling and she wears medical grade compression stockings consistently on a daily basis.  She does have notable neuropathy.  She notes is not necessarily painful just numb.  She recently had arterial studies which were normal.  She notes that the varicosities are not bothersome for her but she was asked by her neurologist to have them evaluated.  Today noninvasive study showed no evidence of DVT or superficial thrombophlebitis bilaterally.  No evidence of deep venous insufficiency bilaterally.  No evidence of superficial venous reflux in the bilateral great saphenous veins.  There are 2 varicosities in the calf veins however they do have reflux.    Review of Systems     Objective:   Physical Exam  BP (!) 145/82   Pulse 76   Resp 18   Ht 5' 4 (1.626 m)   Wt 140 lb 12.8 oz (63.9 kg)   BMI 24.17 kg/m   Past Medical History:  Diagnosis Date   Arthritis    Essential hypertension    FH: colonic polyps    Frequent PVCs    RV outflow tract   GERD (gastroesophageal reflux disease)    Hyperlipidemia    Hypothyroidism    Iron deficiency anemia    Mild mitral regurgitation    Neuropathy    lower leg   Non-obstructive CAD (coronary artery disease)    a. 08/2015 ETT: Ex time 6:18. No ST/T changes;  b. 09/2016 Cath: LM nl, LAD 50d, small bridging, LCX nl, RCA nl, EF 55-65%-->Med Rx. c. Cor CT 03/2018 calcium  score 0, 25-50% LAD.   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   Vertigo    none for several years    Social History   Socioeconomic History   Marital  status: Married    Spouse name: Not on file   Number of children: Not on file   Years of education: Not on file   Highest education level: Not on file  Occupational History   Occupation: Retired - insurance  Tobacco Use   Smoking status: Never   Smokeless tobacco: Never  Vaping Use   Vaping status: Never Used  Substance and Sexual Activity   Alcohol use: No   Drug use: No   Sexual activity: Not Currently    Birth control/protection: Post-menopausal  Other Topics Concern   Not on file  Social History Narrative   Lives in [place] with husband.  Does not routinely exercise.   Social Drivers of Corporate Investment Banker Strain: Not on file  Food Insecurity: Not on file  Transportation Needs: Not on file  Physical Activity: Not on file  Stress: Not on file  Social Connections: Not on file  Intimate Partner Violence: Not on file    Past Surgical History:  Procedure Laterality Date   ANKLE SURGERY Left    Fracture   CARDIAC CATHETERIZATION N/A 09/22/2016   Procedure: Left Heart Cath and Coronary Angiography;  Surgeon: Alm LELON Clay, MD;  Location: [facility] INVASIVE CV LAB;  Service: Cardiovascular;  Laterality: N/A;  CATARACT EXTRACTION W/PHACO Left 05/25/2022   Procedure: CATARACT EXTRACTION PHACO AND INTRAOCULAR LENS PLACEMENT (IOC) LEFT 5.42 00:47.4;  Surgeon: Myrna Adine Anes, MD;  Location: [facility];  Service: Ophthalmology;  Laterality: Left;   COLONOSCOPY N/A 08/04/2021   Procedure: COLONOSCOPY;  Surgeon: Onita Elspeth Sharper, DO;  Location: [facility] ENDOSCOPY;  Service: Gastroenterology;  Laterality: N/A;   COLONOSCOPY WITH PROPOFOL  N/A 07/05/2015   Procedure: COLONOSCOPY WITH PROPOFOL ;  Surgeon: Lamar ONEIDA Holmes, MD;  Location: [facility] ENDOSCOPY;  Service: Endoscopy;  Laterality: N/A;   CYSTOCELE REPAIR N/A 04/29/2020   Procedure: ANTERIOR REPAIR (CYSTOCELE);  Surgeon: Schermerhorn, Debby PARAS, MD;  Location: [facility] ORS;  Service: Gynecology;  Laterality: N/A;    DILATION AND CURETTAGE OF UTERUS     ESOPHAGOGASTRODUODENOSCOPY (EGD) WITH PROPOFOL  N/A 03/19/2016   Procedure: ESOPHAGOGASTRODUODENOSCOPY (EGD) WITH PROPOFOL ;  Surgeon: Lamar ONEIDA Holmes, MD;  Location: [facility] ENDOSCOPY;  Service: Endoscopy;  Laterality: N/A;   EYE SURGERY Right 09/17/2004   cataract - [facility] - [facility]   TONSILLECTOMY     VAGINAL HYSTERECTOMY N/A 04/29/2020   Procedure: HYSTERECTOMY VAGINAL;  Surgeon: Schermerhorn, Debby PARAS, MD;  Location: [facility] ORS;  Service: Gynecology;  Laterality: N/A;    Family History  Problem Relation Age of Onset   Alzheimer's disease Mother    Dementia Mother    Stroke Father    Coronary artery disease Neg Hx    Breast cancer Neg Hx     Allergies  Allergen Reactions   Alendronate Other (See Comments)    Can't recall reaction       Latest Ref Rng & Units 06/16/2021    2:53 PM 05/13/2021    2:09 PM 04/25/2020    8:35 AM  CBC  WBC 4.0 - 10.5 K/uL 5.7  6.2  5.3   Hemoglobin 12.0 - 15.0 g/dL 86.3  86.0  86.6   Hematocrit 36.0 - 46.0 % 39.4  41.3  37.7   Platelets 150 - 400 K/uL 268  265  232       CMP     Component Value Date/Time   NA 135 06/26/2021 1514   K 3.8 06/26/2021 1514   CL 107 06/26/2021 1514   CO2 24 06/26/2021 1514   GLUCOSE 94 06/26/2021 1514   BUN 12 06/26/2021 1514   CREATININE 0.58 06/26/2021 1514   CALCIUM  8.7 (L) 06/26/2021 1514   PROT 6.5 06/26/2021 1514   ALBUMIN 3.9 06/26/2021 1514   AST 18 06/26/2021 1514   ALT 19 06/26/2021 1514   ALKPHOS 54 06/26/2021 1514   BILITOT 0.4 06/26/2021 1514   GFRNONAA >60 06/26/2021 1514     No results found.     Assessment & Plan:   1. Varicose veins of lower extremity, unspecified laterality, unspecified whether complicated (Primary) At this time the patient's varicosities are not particularly bothersome.  She has reflux in a couple of the veins that are superficial in her bilateral lower extremities.  These would contribute to tenderness or discomfort but she  denies any of that.  This is not a cause or source of her neuropathy.  I have discussed that at times swelling can somewhat worsen inflammation which can worsen her neuropathy and so continued use of her medical grade compression stockings as advised.  She should also elevate her lower extremities as possible as well as walking and be active if tolerable as well.  We discussed treatment of the varicosities utilizing sclerotherapy but the patient notes that these are not particularly uncomfortable for  her and so it is certainly reasonable not to move forward with any intervention.  Will have patient follow-up as needed.  2. Mixed hyperlipidemia Continue statin as ordered and reviewed, no changes at this time  3. Essential hypertension, benign Continue antihypertensive medications as already ordered, these medications have been reviewed and there are no changes at this time.   Current Outpatient Medications on File Prior to Visit  Medication Sig Dispense Refill   Alpha-Lipoic Acid 200 MG TABS Take 1 tablet by mouth in the morning, at noon, and at bedtime.     amLODipine  (NORVASC ) 5 MG tablet Take 1 tablet (5 mg total) by mouth daily. 90 tablet 2   aspirin  EC 81 MG tablet Take 81 mg by mouth daily.     esomeprazole (NEXIUM) 40 MG capsule Take 40 mg by mouth daily as needed (reflux).      estradiol  (ESTRACE ) 0.1 MG/GM vaginal cream Estrogen Cream Instruction Discard applicator Apply pea sized amount to tip of finger to urethra before bed. Wash hands well after application. Use Monday, Wednesday and Friday. 42.5 g 12   flecainide  (TAMBOCOR ) 50 MG tablet Take 1 tablet by mouth twice daily 180 tablet 1   gabapentin  (NEURONTIN ) 100 MG capsule Take 1 capsule by mouth in the morning and at bedtime.     levothyroxine  (SYNTHROID , LEVOTHROID) 50 MCG tablet Take 50 mcg by mouth daily before breakfast.     Multiple Vitamins-Minerals (MULTIVITAL) tablet Take 1 tablet by mouth daily.     rosuvastatin  (CRESTOR ) 5  MG tablet Take 1 tablet by mouth once daily 90 tablet 3   No current facility-administered medications on file prior to visit.    There are no Patient Instructions on file for this visit. No follow-ups on file.   Alanmichael Barmore E Adelle Zachar, NP

## 2023-11-29 ENCOUNTER — Ambulatory Visit: Payer: Medicare Other | Admitting: Physical Therapy

## 2023-11-29 DIAGNOSIS — R2689 Other abnormalities of gait and mobility: Secondary | ICD-10-CM

## 2023-11-29 DIAGNOSIS — R293 Abnormal posture: Secondary | ICD-10-CM

## 2023-11-29 DIAGNOSIS — M6281 Muscle weakness (generalized): Secondary | ICD-10-CM

## 2023-11-29 NOTE — Therapy (Signed)
OUTPATIENT PHYSICAL THERAPY LOWER EXTREMITY TREATMENT   Patient Name: Alejandra Singh MRN: [id number] DOB:[birth year], 83 y.o., female Today's Date: 11/29/2023  END OF SESSION:  PT End of Session - 11/29/23 1350     Visit Number 2    Number of Visits 16    Date for PT Re-Evaluation 01/17/24    PT Start Time 1340    PT Stop Time 1442    PT Time Calculation (min) 62 min    Equipment Utilized During Treatment Gait belt    Activity Tolerance Patient tolerated treatment well    Behavior During Therapy WFL for tasks assessed/performed             Past Medical History:  Diagnosis Date   Arthritis    Essential hypertension    FH: colonic polyps    Frequent PVCs    RV outflow tract   GERD (gastroesophageal reflux disease)    Hyperlipidemia    Hypothyroidism    Iron deficiency anemia    Mild mitral regurgitation    Neuropathy    lower leg   Non-obstructive CAD (coronary artery disease)    a. 08/2015 ETT: Ex time 6:18. No ST/T changes;  b. 09/2016 Cath: LM nl, LAD 50d, small bridging, LCX nl, RCA nl, EF 55-65%-->Med Rx. c. Cor CT 03/2018 calcium score 0, 25-50% LAD.   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   Vertigo    none for several years   Past Surgical History:  Procedure Laterality Date   ANKLE SURGERY Left    Fracture   CARDIAC CATHETERIZATION N/A 09/22/2016   Procedure: Left Heart Cath and Coronary Angiography;  Surgeon: Marykay Lex, MD;  Location: [facility] INVASIVE CV LAB;  Service: Cardiovascular;  Laterality: N/A;   CATARACT EXTRACTION W/PHACO Left 05/25/2022   Procedure: CATARACT EXTRACTION PHACO AND INTRAOCULAR LENS PLACEMENT (IOC) LEFT 5.42 00:47.4;  Surgeon: Nevada Crane, MD;  Location: [facility];  Service: Ophthalmology;  Laterality: Left;   COLONOSCOPY N/A 08/04/2021   Procedure: COLONOSCOPY;  Surgeon: Jaynie Collins, DO;  Location: [facility] ENDOSCOPY;  Service: Gastroenterology;   Laterality: N/A;   COLONOSCOPY WITH PROPOFOL N/A 07/05/2015   Procedure: COLONOSCOPY WITH PROPOFOL;  Surgeon: Scot Jun, MD;  Location: [facility] ENDOSCOPY;  Service: Endoscopy;  Laterality: N/A;   CYSTOCELE REPAIR N/A 04/29/2020   Procedure: ANTERIOR REPAIR (CYSTOCELE);  Surgeon: Schermerhorn, Ihor Austin, MD;  Location: [facility] ORS;  Service: Gynecology;  Laterality: N/A;   DILATION AND CURETTAGE OF UTERUS     ESOPHAGOGASTRODUODENOSCOPY (EGD) WITH PROPOFOL N/A 03/19/2016   Procedure: ESOPHAGOGASTRODUODENOSCOPY (EGD) WITH PROPOFOL;  Surgeon: Scot Jun, MD;  Location: [facility] ENDOSCOPY;  Service: Endoscopy;  Laterality: N/A;   EYE SURGERY Right 09/17/2004   cataract - [facility] - [facility]   TONSILLECTOMY     VAGINAL HYSTERECTOMY N/A 04/29/2020   Procedure: HYSTERECTOMY VAGINAL;  Surgeon: Schermerhorn, Ihor Austin, MD;  Location: [facility] ORS;  Service: Gynecology;  Laterality: N/A;   Patient Active Problem List   Diagnosis Date Noted   Other abnormalities of gait and mobility 11/22/2023   Muscle weakness (generalized) 11/22/2023   Abnormal posture 11/22/2023   Frequent PVCs 03/28/2018   Chest discomfort 03/26/2018   Bradycardia with 31-40 beats per minute 09/21/2016   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   THERAPY DIAG:  Other abnormalities of gait and mobility  Muscle weakness (generalized)  Abnormal posture  Rationale for Evaluation and Treatment: Rehabilitation  ONSET DATE: Chronic  SUBJECTIVE:   SUBJECTIVE STATEMENT:  Pt. Arrives to PT appointment on time and prepared for their evaluation using a SPC.  Pt. Describes that they have experienced difficulties related to maintaining their balance after standing up after prolonged instances of sitting. Pt. Explains that they regularly experience these episodes of dizziness during church, and fear having a fall in public settings. Pt. Expresses that they have a great support group within their church community, but they wish to progress towards returning to ambulating with no AD or the LRAD. Pt. States that they transitioned to using a SPC after having several episodes of unsteadiness on their feet. Pt. Discusses their PMH related to having peripheral neuropathy, with symptoms being worse on the LLE. Pt. Notes that that they are not experiencing any related neuropathic pain 0/10 NPS. Pt. Elaborates that their PLOF was independent with all ADLs/IADLs but they are now challenged by forward reaching/bending activities such as putting away laundry. Pt. Expresses that they would like to exercise more and be able to transition from sitting to standing in church independently and without LOB. Pt. Also notes that they have recently stopped taking their 100 mg Gabapentin and have not felt the need to return to their previous dosage.  PERTINENT HISTORY: LE Neuropathy, LLE more affected than RLE PAIN:  Are you having pain? No  PRECAUTIONS: Fall  RED FLAGS: None   WEIGHT BEARING RESTRICTIONS: No  FALLS:  Has patient fallen in last 6 months? Yes. Number of falls 2  LIVING ENVIRONMENT: Lives with: lives with their family and lives with their spouse Lives in: House/apartment Stairs: Yes: Internal: 14 steps; on right going up Has following equipment at home: Single point cane  OCCUPATION: Retired  PLOF: Independent  PATIENT GOALS:   - Reduce episodes of transitional LOB - Increase weekly exercise frequency - Manage neuropathic symptoms - Return to forward-reaching yard-work activities such as planting flowers - Return to being independent with all  ADLs/IADLs - Increase confidence ambulating within the community  NEXT MD VISIT: N/A  OBJECTIVE:  Note: Objective measures were completed at Evaluation unless otherwise noted.  DIAGNOSTIC FINDINGS:  No imaging  - B LE Weakness - Proximal hip stabilization musculature weakness - Ataxic gait, limited stride length and step width - MCI of B LE related to neuropathy - Decreased sensation to B LE at L4, L5, and S1 dermatomes  PATIENT SURVEYS:  LEFS 44/80  COGNITION: Overall cognitive status: Within functional limits for tasks assessed     SENSATION: Light touch: Impaired   EDEMA:  NT  MUSCLE LENGTH: Hamstrings: Right 85 deg; Left 87 deg (Straight leg raise)   POSTURE: anterior pelvic tilt  PALPATION: Minimal distal LE swelling, no signs of pitting edema.  LOWER EXTREMITY ROM:  Active ROM Right eval Left eval  Hip flexion 85 87  Hip extension    Hip abduction    Hip adduction WNL WNL  Hip internal rotation WNL WNL  Hip external rotation    Knee flexion 123 120  Knee extension 0 0  Ankle dorsiflexion 3 0  Ankle plantarflexion 40 35  Ankle inversion    Ankle eversion     (  Blank rows = not tested)  LOWER EXTREMITY MMT:  MMT Right eval Left eval  Hip flexion 3 3  Hip extension 3 3  Hip abduction 3 3  Hip adduction    Hip internal rotation 3 3  Hip external rotation 3 3  Knee flexion 4 4  Knee extension 4 4  Ankle dorsiflexion 3 3  Ankle plantarflexion 4 4  Ankle inversion 3 3  Ankle eversion 3 3   (Blank rows = not tested)  LOWER EXTREMITY SPECIAL TESTS:  Hip special tests: FADDIR (negative)  FUNCTIONAL TESTS:  5 times sit to stand: 15.2 seconds with a LOB episode on repetition 4. Pt. Recovered by sitting back in chair and completed rep 5.  GAIT: Distance walked: 50 ft. Assistive device utilized: Single point cane Level of assistance: Complete Independence Comments: Pt. Demonstrates decreased stride length, increased step width, decreased hip  flexion, an anterior pelvic tilt, forward scapulothoracic posture, and lacks extension during toe-off phase of gait cycle.                                                                                                                              TREATMENT DATE: 11/29/2023  Subjective:  Pt. Arrives to treatment session early and prepared. Pt. Describes that they have been performing their home exercises once per day. Pt. Expresses wanting to eliminate the use of their SPC. Pt. Inquires about the use of infa-red light treatment for knee pain.   Therapeutic Ex:  Seated B short arc quad 10 lb ankle weight. 3 x 15. RPE 4/10 and NPS 0/10.  Seated B AAROM Dorsiflexion using GTB. 4# ankle weight on foot 2 x 20. RPE 8/10 NPS 0/10. (Pt. Expresses LLE being more difficult than right.)  Reviewed HEP  Therapeutic Activity:  Supine bridging 2 x 20. GTB around knees. RPE 5/10  Staircase FFE Lunges 2 x 20 B. RPE 4/10 and NPS 2/10.  Staircase single leg step-down with wedge under standing foot to increase gluteal activation. 2 x 15 B. RPE 8/10 and NPS 4/10  Blaze-pods - Focus setting forward lunge/toe-tap for recovery step training. 3 x 1 minute continuous reps.   Patient Education: Gluteal muscle activation cues and anterior pelvic tilt correction. Verbal communication with demonstration.   PATIENT EDUCATION:  Education details: Pt. Education related to postural corrections of anterior pelvic tilt, the recovery step during LOB episodes, and increasing ground clearance during gait Person educated: Patient Education method: Demonstration Education comprehension: verbalized understanding  HOME EXERCISE PROGRAM: Access Code: [email or web address] URL: [email or web address] Date: 11/22/2023 Prepared by: Dorene Grebe  Exercises - Side Stepping with Resistance at Ankles and Counter Support  - 1 x daily - 7 x weekly - 3 sets - 10 reps - Standing Hip Extension with Resistance at Ankles and  Counter Support  - 1 x daily - 7 x weekly - 3 sets - 10 reps - Mini Squat with Counter Support  - 1 x daily -  7 x weekly - 3 sets - 10 reps - Long Sitting Ankle Plantar Flexion with Resistance  - 1 x daily - 7 x weekly - 3 sets - 10 reps  ASSESSMENT:  CLINICAL IMPRESSION:  Today's treatment session prioritized advancing HEP and the difficulty of therapeutic activities/interventions. PT Provided education regarding dorsiflexion activation during gait and gluteal musculature activation with posterior pelvic tilt.  Pt. Showed LE proprioception improvement after completing three sets of blaze pods during a recovery step training activity. Pt. Continually expressed LLE neuropathy symptoms being worse than RLE throughout therapeutic activities. Pt. Will benefit from skilled physical therapy interventions focused on improving balance impairments to return them to participating in IADLs/ADLs independently with LRAD.  OBJECTIVE IMPAIRMENTS: decreased strength.   ACTIVITY LIMITATIONS:  Transitioning from sitting to standing quickly  PARTICIPATION LIMITATIONS: church  PERSONAL FACTORS:  Neuropathy progression  is also affecting patient's functional outcome.   REHAB POTENTIAL: Good  CLINICAL DECISION MAKING: Evolving/moderate complexity  EVALUATION COMPLEXITY: Moderate   GOALS: Goals reviewed with patient? Yes  SHORT TERM GOALS: Target date: 12/06/2023  Pt. Will be able to progress their exercise participation to 3x per week for a duration of 50 minutes to improve their global endurance and reduce muscle fatigue when ambulating within the community.  Baseline: Pt. Exercises 0 times per week outside of PT treatment Goal status: INITIAL  2.  Pt. Will demonstrate anterior pelvic tilt postural correction without cuing to improve proximal hip muscular recruitment and increase LE stability during both static/dynamic ADLs/IADLs.  Baseline: Unable to self correct without demonstration Goal status:  INITIAL    LONG TERM GOALS: Target date: 01/17/2024  Pt. Will be able to walk 371ft. with LRAD and 0 LOB episodes to improve pt. Confidence when running errands in town. Baseline:  Goal status: INITIAL  2.  Pt. Will be able to demonstrate a strong/stable first recovery step during LOB episodes to decrease injury risk associated with forward falls such as a FOOSH. Baseline:  Goal status: INITIAL  3.  Pt. Will reduce their five time sit to stand time from 15.2 seconds to 12 seconds without LOB to demonstrate improved motor coordination with positional transitions. Baseline: 15.2 Seconds with 1 LOB Goal status: INITIAL  4.  Pt. Will increase their B forward reach from 7.5" to 10" to demonstrate improvements in global UE balance/proprioception during ADLs/IADLs. Baseline: 7.5" Bilaterally Goal status: INITIAL   PLAN:  PT FREQUENCY: 1-2x/week  PT DURATION: 8 weeks  PLANNED INTERVENTIONS: 97110-Therapeutic exercises, 97530- Therapeutic activity, [id number]- Neuromuscular re-education, 97535- Self Care, and [id number]- Manual therapy  PLAN FOR NEXT SESSION: Assess BERG or Tinetti   Cammie Mcgee, PT, DPT # [phone] 11/29/2023, 3:04 PM

## 2023-12-01 ENCOUNTER — Encounter: Payer: Self-pay | Admitting: Physical Therapy

## 2023-12-01 ENCOUNTER — Ambulatory Visit: Payer: Medicare Other | Admitting: Physical Therapy

## 2023-12-01 DIAGNOSIS — R2689 Other abnormalities of gait and mobility: Secondary | ICD-10-CM | POA: Diagnosis not present

## 2023-12-01 DIAGNOSIS — R293 Abnormal posture: Secondary | ICD-10-CM

## 2023-12-01 DIAGNOSIS — M6281 Muscle weakness (generalized): Secondary | ICD-10-CM

## 2023-12-01 NOTE — Therapy (Signed)
OUTPATIENT PHYSICAL THERAPY LOWER EXTREMITY TREATMENT  Patient Name: Alejandra Singh MRN: [id number] DOB:1941/01/07, 83 y.o., female Today's Date: 12/01/2023  END OF SESSION:  PT End of Session - 12/01/23 0945     Visit Number 3    Number of Visits 16    Date for PT Re-Evaluation 01/17/24    PT Start Time 0935    PT Stop Time 1028    PT Time Calculation (min) 53 min    Equipment Utilized During Treatment Gait belt    Activity Tolerance Patient tolerated treatment well    Behavior During Therapy WFL for tasks assessed/performed             Past Medical History:  Diagnosis Date   Arthritis    Essential hypertension    FH: colonic polyps    Frequent PVCs    RV outflow tract   GERD (gastroesophageal reflux disease)    Hyperlipidemia    Hypothyroidism    Iron deficiency anemia    Mild mitral regurgitation    Neuropathy    lower leg   Non-obstructive CAD (coronary artery disease)    a. 08/2015 ETT: Ex time 6:18. No ST/T changes;  b. 09/2016 Cath: LM nl, LAD 50d, small bridging, LCX nl, RCA nl, EF 55-65%-->Med Rx. c. Cor CT 03/2018 calcium score 0, 25-50% LAD.   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   Vertigo    none for several years   Past Surgical History:  Procedure Laterality Date   ANKLE SURGERY Left    Fracture   CARDIAC CATHETERIZATION N/A 09/22/2016   Procedure: Left Heart Cath and Coronary Angiography;  Surgeon: Marykay Lex, MD;  Location: [facility] INVASIVE CV LAB;  Service: Cardiovascular;  Laterality: N/A;   CATARACT EXTRACTION W/PHACO Left 05/25/2022   Procedure: CATARACT EXTRACTION PHACO AND INTRAOCULAR LENS PLACEMENT (IOC) LEFT 5.42 00:47.4;  Surgeon: Nevada Crane, MD;  Location: [facility];  Service: Ophthalmology;  Laterality: Left;   COLONOSCOPY N/A 08/04/2021   Procedure: COLONOSCOPY;  Surgeon: Jaynie Collins, DO;  Location: [facility] END[facility];  Service: Gastroenterology;   Laterality: N/A;   COLONOSCOPY WITH PROPOFOL N/A 07/05/2015   Procedure: COLONOSCOPY WITH PROPOFOL;  Surgeon: Scot Jun, MD;  Location: [facility] ENDOSCOPY;  Service: Endoscopy;  Laterality: N/A;   CYSTOCELE REPAIR N/A 04/29/2020   Procedure: ANTERIOR REPAIR (CYSTOCELE);  Surgeon: Schermerhorn, Ihor Austin, MD;  Location: [facility] ORS;  Service: Gynecology;  Laterality: N/A;   DILATION AND CURETTAGE OF UTERUS     ESOPHAGOGASTRODUODENOSCOPY (EGD) WITH PROPOFOL N/A 03/19/2016   Procedure: ESOPHAGOGASTRODUODENOSCOPY (EGD) WITH PROPOFOL;  Surgeon: Scot Jun, MD;  Location: [facility] ENDOSCOPY;  Service: Endoscopy;  Laterality: N/A;   EYE SURGERY Right 09/17/2004   cataract - [facility] - [facility]   TONSILLECTOMY     VAGINAL HYSTERECTOMY N/A 04/29/2020   Procedure: HYSTERECTOMY VAGINAL;  Surgeon: Schermerhorn, Ihor Austin, MD;  Location: [facility] ORS;  Service: Gynecology;  Laterality: N/A;   Patient Active Problem List   Diagnosis Date Noted   Other abnormalities of gait and mobility 11/22/2023   Muscle weakness (generalized) 11/22/2023   Abnormal posture 11/22/2023   Frequent PVCs 03/28/2018   Chest discomfort 03/26/2018   Bradycardia with 31-40 beats per minute 09/21/2016   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   THERAPY DIAG:  Other abnormalities of gait and mobility  Muscle weakness (generalized)  Abnormal posture  Rationale for Evaluation and Treatment: Rehabilitation  ONSET DATE: Chronic  SUBJECTIVE:   SUBJECTIVE STATEMENT:  Pt. Arrives to PT appointment on time and prepared for their evaluation using a SPC. Pt.  Describes that they have experienced difficulties related to maintaining their balance after standing up after prolonged instances of sitting. Pt. Explains that they regularly experience these episodes of dizziness during church, and fear having a fall in public settings. Pt. Expresses that they have a great support group within their church community, but they wish to progress towards returning to ambulating with no AD or the LRAD. Pt. States that they transitioned to using a SPC after having several episodes of unsteadiness on their feet. Pt. Discusses their PMH related to having peripheral neuropathy, with symptoms being worse on the LLE. Pt. Notes that that they are not experiencing any related neuropathic pain 0/10 NPS. Pt. Elaborates that their PLOF was independent with all ADLs/IADLs but they are now challenged by forward reaching/bending activities such as putting away laundry. Pt. Expresses that they would like to exercise more and be able to transition from sitting to standing in church independently and without LOB. Pt. Also notes that they have recently stopped taking their 100 mg Gabapentin and have not felt the need to return to their previous dosage.  PERTINENT HISTORY: LE Neuropathy, LLE more affected than RLE PAIN:  Are you having pain? No  PRECAUTIONS: Fall  RED FLAGS: None   WEIGHT BEARING RESTRICTIONS: No  FALLS:  Has patient fallen in last 6 months? Yes. Number of falls 2  LIVING ENVIRONMENT: Lives with: lives with their family and lives with their spouse Lives in: House/apartment Stairs: Yes: Internal: 14 steps; on right going up Has following equipment at home: Single point cane  OCCUPATION: Retired  PLOF: Independent  PATIENT GOALS:   - Reduce episodes of transitional LOB - Increase weekly exercise frequency - Manage neuropathic symptoms - Return to forward-reaching yard-work activities such as planting flowers - Return to being independent with all  ADLs/IADLs - Increase confidence ambulating within the community  NEXT MD VISIT: N/A  OBJECTIVE:  Note: Objective measures were completed at Evaluation unless otherwise noted.  DIAGNOSTIC FINDINGS:  No imaging  - B LE Weakness - Proximal hip stabilization musculature weakness - Ataxic gait, limited stride length and step width - MCI of B LE related to neuropathy - Decreased sensation to B LE at L4, L5, and S1 dermatomes  PATIENT SURVEYS:  LEFS 44/80 Berg 46/56 (11/30/2022)  COGNITION: Overall cognitive status: Within functional limits for tasks assessed     SENSATION: Light touch: Impaired   EDEMA:  NT  MUSCLE LENGTH: Hamstrings: Right 85 deg; Left 87 deg (Straight leg raise)   POSTURE: anterior pelvic tilt  PALPATION: Minimal distal LE swelling, no signs of pitting edema.  LOWER EXTREMITY ROM:  Active ROM Right eval Left eval  Hip flexion 85 87  Hip extension    Hip abduction    Hip adduction WNL WNL  Hip internal rotation WNL WNL  Hip external rotation    Knee flexion 123 120  Knee extension 0 0  Ankle dorsiflexion 3 0  Ankle plantarflexion 40 35  Ankle inversion    Ankle eversion     (  Blank rows = not tested)  LOWER EXTREMITY MMT:  MMT Right eval Left eval  Hip flexion 3 3  Hip extension 3 3  Hip abduction 3 3  Hip adduction    Hip internal rotation 3 3  Hip external rotation 3 3  Knee flexion 4 4  Knee extension 4 4  Ankle dorsiflexion 3 3  Ankle plantarflexion 4 4  Ankle inversion 3 3  Ankle eversion 3 3   (Blank rows = not tested)  LOWER EXTREMITY SPECIAL TESTS:  Hip special tests: FADDIR (negative)  FUNCTIONAL TESTS:  5 times sit to stand: 15.2 seconds with a LOB episode on repetition 4. Pt. Recovered by sitting back in chair and completed rep 5.  GAIT: Distance walked: 50 ft. Assistive device utilized: Single point cane Level of assistance: Complete Independence Comments: Pt. Demonstrates decreased stride length, increased  step width, decreased hip flexion, an anterior pelvic tilt, forward scapulothoracic posture, and lacks extension during toe-off phase of gait cycle.                                                                                                                              TREATMENT DATE: 12/01/2023  Subjective:  Pt. Arrives to treatment session early and prepared. Pt. Describes that they have been performing their home exercises once per day and have been able to add ankle weights for leg extensions. Pt. Expresses they had a chiropractic appointment yesterday and are scheduled for two weekly chiropractic treatment sessions each week.   Therapeutic Ex:  Seated B short arc quad 10 lb ankle weight. 3 x 15. RPE 4/10 and NPS 0/10.  Seated B AAROM Dorsiflexion using GTB. 4# ankle weight on foot 2 x 20. RPE 8/10 NPS 0/10. (Pt. Expresses LLE being more difficult than right.)  Hip IR: Seated RTB around ankles, 8' ball between knees. 3x10 bilaterally.   Therapeutic Activity:  Supine bridging 2 x 20. GTB around knees. RPE 5/10  Staircase FFE Lunges 2 x 20 B. RPE 4/10 and NPS 2/10.  Staircase single leg step-down with wedge under standing foot to increase gluteal activation. 2 x 15 B. RPE 8/10 and NPS 4/10  Blaze-pods - Focus setting forward lunge/toe-tap for recovery step training. 3 x 1 minute continuous reps.   Berg balance test: 46/56  (discussed fall risk/ use of SPC)  Patient Education: Gluteal muscle activation cues and anterior pelvic tilt correction. Verbal communication with demonstration.   PATIENT EDUCATION:  Education details: Pt. Education related to postural corrections of anterior pelvic tilt, the recovery step during LOB episodes, and increasing ground clearance during gait Person educated: Patient Education method: Demonstration Education comprehension: verbalized understanding  HOME EXERCISE PROGRAM: Access Code: [email or web address] URL: [email or web address] Date:  11/22/2023 Prepared by: Dorene Grebe  Exercises - Side Stepping with Resistance at Ankles and Counter Support  - 1 x daily - 7 x weekly - 3 sets - 10 reps - Standing Hip Extension with Resistance at  Ankles and Counter Support  - 1 x daily - 7 x weekly - 3 sets - 10 reps - Mini Squat with Counter Support  - 1 x daily - 7 x weekly - 3 sets - 10 reps - Long Sitting Ankle Plantar Flexion with Resistance  - 1 x daily - 7 x weekly - 3 sets - 10 reps  ASSESSMENT:  CLINICAL IMPRESSION:  Today's treatment session prioritized assessing pts balance using BERG. Pt. Expressed that their balance issues primarily result from limited sensation in their feet related to ongoing neuropathy. Pt. Scored 46/56 on BERG balance scale and was primarily challenged with single leg standing tolerance, and tandem standing tolerance. Pt. Will benefit from skilled physical therapy interventions focused on improving balance impairments to return them to participating in IADLs/ADLs independently with LRAD.  OBJECTIVE IMPAIRMENTS: decreased strength.   ACTIVITY LIMITATIONS:  Transitioning from sitting to standing quickly  PARTICIPATION LIMITATIONS: church  PERSONAL FACTORS:  Neuropathy progression  is also affecting patient's functional outcome.   REHAB POTENTIAL: Good  CLINICAL DECISION MAKING: Evolving/moderate complexity  EVALUATION COMPLEXITY: Moderate   GOALS: Goals reviewed with patient? Yes  SHORT TERM GOALS: Target date: 12/06/2023  Pt. Will be able to progress their exercise participation to 3x per week for a duration of 50 minutes to improve their global endurance and reduce muscle fatigue when ambulating within the community.  Baseline: Pt. Exercises 0 times per week outside of PT treatment Goal status: INITIAL  2.  Pt. Will demonstrate anterior pelvic tilt postural correction without cuing to improve proximal hip muscular recruitment and increase LE stability during both static/dynamic  ADLs/IADLs.  Baseline: Unable to self correct without demonstration Goal status: INITIAL    LONG TERM GOALS: Target date: 01/17/2024  Pt. Will be able to walk 3108ft. with LRAD and 0 LOB episodes to improve pt. Confidence when running errands in town. Baseline:  Goal status: INITIAL  2.  Pt. Will be able to demonstrate a strong/stable first recovery step during LOB episodes to decrease injury risk associated with forward falls such as a FOOSH. Baseline:  Goal status: INITIAL  3.  Pt. Will reduce their five time sit to stand time from 15.2 seconds to 12 seconds without LOB to demonstrate improved motor coordination with positional transitions. Baseline: 15.2 Seconds with 1 LOB Goal status: INITIAL  4.  Pt. Will increase their B forward reach from 7.5" to 10" to demonstrate improvements in global UE balance/proprioception during ADLs/IADLs. Baseline: 7.5" Bilaterally Goal status: INITIAL   PLAN:  PT FREQUENCY: 1-2x/week  PT DURATION: 8 weeks  PLANNED INTERVENTIONS: 97110-Therapeutic exercises, 97530- Therapeutic activity, [id number]- Neuromuscular re-education, 97535- Self Care, and [id number]- Manual therapy  PLAN FOR NEXT SESSION: Progress LE proprioception exercises   Cammie Mcgee, PT, DPT # [phone] 12/01/2023, 10:30 AM

## 2023-12-08 ENCOUNTER — Ambulatory Visit: Payer: Medicare Other | Admitting: Physical Therapy

## 2023-12-10 ENCOUNTER — Ambulatory Visit: Payer: Medicare Other | Admitting: Physical Therapy

## 2023-12-13 ENCOUNTER — Ambulatory Visit: Payer: Medicare Other | Admitting: Physical Therapy

## 2023-12-13 ENCOUNTER — Encounter: Payer: Self-pay | Admitting: Physical Therapy

## 2023-12-13 DIAGNOSIS — R2689 Other abnormalities of gait and mobility: Secondary | ICD-10-CM

## 2023-12-13 DIAGNOSIS — M6281 Muscle weakness (generalized): Secondary | ICD-10-CM

## 2023-12-13 DIAGNOSIS — R293 Abnormal posture: Secondary | ICD-10-CM

## 2023-12-13 NOTE — Therapy (Signed)
 OUTPATIENT PHYSICAL THERAPY LOWER EXTREMITY TREATMENT  Patient Name: Alejandra Singh MRN: [id number] DOB:1941-04-30, 83 y.o., female Today's Date: 12/13/2023  END OF SESSION:  PT End of Session - 12/13/23 0950     Visit Number 4    Number of Visits 16    Date for PT Re-Evaluation 01/17/24    PT Start Time 0950    PT Stop Time 1036    PT Time Calculation (min) 46 min    Equipment Utilized During Treatment Gait belt    Activity Tolerance Patient tolerated treatment well    Behavior During Therapy WFL for tasks assessed/performed             Past Medical History:  Diagnosis Date   Arthritis    Essential hypertension    FH: colonic polyps    Frequent PVCs    RV outflow tract   GERD (gastroesophageal reflux disease)    Hyperlipidemia    Hypothyroidism    Iron deficiency anemia    Mild mitral regurgitation    Neuropathy    lower leg   Non-obstructive CAD (coronary artery disease)    a. 08/2015 ETT: Ex time 6:18. No ST/T changes;  b. 09/2016 Cath: LM nl, LAD 50d, small bridging, LCX nl, RCA nl, EF 55-65%-->Med Rx. c. Cor CT 03/2018 calcium score 0, 25-50% LAD.   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   Vertigo    none for several years   Past Surgical History:  Procedure Laterality Date   ANKLE SURGERY Left    Fracture   CARDIAC CATHETERIZATION N/A 09/22/2016   Procedure: Left Heart Cath and Coronary Angiography;  Surgeon: Marykay Lex, MD;  Location: [facility] INVASIVE CV LAB;  Service: Cardiovascular;  Laterality: N/A;   CATARACT EXTRACTION W/PHACO Left 05/25/2022   Procedure: CATARACT EXTRACTION PHACO AND INTRAOCULAR LENS PLACEMENT (IOC) LEFT 5.42 00:47.4;  Surgeon: Nevada Crane, MD;  Location: [facility] SURGERY [facility];  Service: Ophthalmology;  Laterality: Left;   COLONOSCOPY N/A 08/04/2021   Procedure: COLONOSCOPY;  Surgeon: Jaynie Collins, DO;  Location: [facility] ENDOSCOPY;  Service: Gastroenterology;   Laterality: N/A;   COLONOSCOPY WITH PROPOFOL N/A 07/05/2015   Procedure: COLONOSCOPY WITH PROPOFOL;  Surgeon: Scot Jun, MD;  Location: [facility] ENDOSCOPY;  Service: Endoscopy;  Laterality: N/A;   CYSTOCELE REPAIR N/A 04/29/2020   Procedure: ANTERIOR REPAIR (CYSTOCELE);  Surgeon: Schermerhorn, Ihor Austin, MD;  Location: [facility] ORS;  Service: Gynecology;  Laterality: N/A;   DILATION AND CURETTAGE OF UTERUS     ESOPHAGOGASTRODUODENOSCOPY (EGD) WITH PROPOFOL N/A 03/19/2016   Procedure: ESOPHAGOGASTRODUODENOSCOPY (EGD) WITH PROPOFOL;  Surgeon: Scot Jun, MD;  Location: [facility] ENDOSCOPY;  Service: Endoscopy;  Laterality: N/A;   EYE SURGERY Right 09/17/2004   cataract - [facility]   TONSILLECTOMY     VAGINAL HYSTERECTOMY N/A 04/29/2020   Procedure: HYSTERECTOMY VAGINAL;  Surgeon: Schermerhorn, Ihor Austin, MD;  Location: [facility] ORS;  Service: Gynecology;  Laterality: N/A;   Patient Active Problem List   Diagnosis Date Noted   Other abnormalities of gait and mobility 11/22/2023   Muscle weakness (generalized) 11/22/2023   Abnormal posture 11/22/2023   Frequent PVCs 03/28/2018   Chest discomfort 03/26/2018   Bradycardia with 31-40 beats per minute 09/21/2016   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   THERAPY DIAG:  Other abnormalities of gait and mobility  Muscle weakness (generalized)  Abnormal posture  Rationale for Evaluation and Treatment: Rehabilitation  ONSET DATE: Chronic  SUBJECTIVE:   SUBJECTIVE STATEMENT:  Pt. Arrives to PT appointment on time and prepared for their evaluation using a SPC. Pt.  Describes that they have experienced difficulties related to maintaining their balance after standing up after prolonged instances of sitting. Pt. Explains that they regularly experience these episodes of dizziness during church, and fear having a fall in public settings. Pt. Expresses that they have a great support group within their church community, but they wish to progress towards returning to ambulating with no AD or the LRAD. Pt. States that they transitioned to using a SPC after having several episodes of unsteadiness on their feet. Pt. Discusses their PMH related to having peripheral neuropathy, with symptoms being worse on the LLE. Pt. Notes that that they are not experiencing any related neuropathic pain 0/10 NPS. Pt. Elaborates that their PLOF was independent with all ADLs/IADLs but they are now challenged by forward reaching/bending activities such as putting away laundry. Pt. Expresses that they would like to exercise more and be able to transition from sitting to standing in church independently and without LOB. Pt. Also notes that they have recently stopped taking their 100 mg Gabapentin and have not felt the need to return to their previous dosage.  PERTINENT HISTORY: LE Neuropathy, LLE more affected than RLE PAIN:  Are you having pain? No  PRECAUTIONS: Fall  RED FLAGS: None   WEIGHT BEARING RESTRICTIONS: No  FALLS:  Has patient fallen in last 6 months? Yes. Number of falls 2  LIVING ENVIRONMENT: Lives with: lives with their family and lives with their spouse Lives in: House/apartment Stairs: Yes: Internal: 14 steps; on right going up Has following equipment at home: Single point cane  OCCUPATION: Retired  PLOF: Independent  PATIENT GOALS:   - Reduce episodes of transitional LOB - Increase weekly exercise frequency - Manage neuropathic symptoms - Return to forward-reaching yard-work activities such as planting flowers - Return to being independent with all  ADLs/IADLs - Increase confidence ambulating within the community  NEXT MD VISIT: N/A  OBJECTIVE:  Note: Objective measures were completed at Evaluation unless otherwise noted.  DIAGNOSTIC FINDINGS:  No imaging  - B LE Weakness - Proximal hip stabilization musculature weakness - Ataxic gait, limited stride length and step width - MCI of B LE related to neuropathy - Decreased sensation to B LE at L4, L5, and S1 dermatomes  PATIENT SURVEYS:  LEFS 44/80 Berg 46/56 (11/30/2022)  COGNITION: Overall cognitive status: Within functional limits for tasks assessed     SENSATION: Light touch: Impaired   EDEMA:  NT  MUSCLE LENGTH: Hamstrings: Right 85 deg; Left 87 deg (Straight leg raise)  POSTURE: anterior pelvic tilt  PALPATION: Minimal distal LE swelling, no signs of pitting edema.  LOWER EXTREMITY ROM:  Active ROM Right eval Left eval  Hip flexion 85 87  Hip extension    Hip abduction    Hip adduction WNL WNL  Hip internal rotation WNL WNL  Hip external rotation    Knee flexion 123 120  Knee extension 0 0  Ankle dorsiflexion 3 0  Ankle plantarflexion 40 35  Ankle inversion    Ankle eversion     (  Blank rows = not tested)  LOWER EXTREMITY MMT:  MMT Right eval Left eval  Hip flexion 3 3  Hip extension 3 3  Hip abduction 3 3  Hip adduction    Hip internal rotation 3 3  Hip external rotation 3 3  Knee flexion 4 4  Knee extension 4 4  Ankle dorsiflexion 3 3  Ankle plantarflexion 4 4  Ankle inversion 3 3  Ankle eversion 3 3   (Blank rows = not tested)  LOWER EXTREMITY SPECIAL TESTS:  Hip special tests: FADDIR (negative)  FUNCTIONAL TESTS:  5 times sit to stand: 15.2 seconds with a LOB episode on repetition 4. Pt. Recovered by sitting back in chair and completed rep 5.  GAIT: Distance walked: 50 ft. Assistive device utilized: Single point cane Level of assistance: Complete Independence Comments: Pt. Demonstrates decreased stride length, increased  step width, decreased hip flexion, an anterior pelvic tilt, forward scapulothoracic posture, and lacks extension during toe-off phase of gait cycle.  Berg balance test: 46/56  (discussed fall risk/ use of SPC)                                                                                                                              TREATMENT DATE: 12/13/2023  Subjective:  Pt. Arrives to treatment session early and prepared.  Pt. States she has been doing better overall with walking, esp. At church.  Pt. States she will rarely use SPC at home but requires [facility] outside of house.  No falls or LOB reported.  Pt. Has appt. With Chiro on Tuesday and Friday.    Neuro:  Walking in gym without use of SPC.  Alt. UE/LE touches with CGA for safety.  Several small LOB with self-correction.    Walking in gym with focus on turning L/R.  Sit to stands from gray chair with no UE assist.    Step touches at 6"/ 12" Step with alt. Touches (light UE assist and progressed to no UE assist)- CGA for safety/ cuing.     Therapeutic Activity:  Blaze-pods - Focus setting at star in gym:  6 pods (all planes of movement) forward lunge/toe-tap for recovery step training. 2 x 2 minute continuous reps.  No fatigue reported.     TRX squats with yellow ball between knees 15x2 with chair behind pt.  Cuing to maintain elbow flexion/ upright posture.     Not today:  Therapeutic Ex:  Seated B short arc quad 10 lb ankle weight. 3 x 15. RPE 4/10 and NPS 0/10.  Seated B AAROM Dorsiflexion using GTB. 4# ankle weight on foot 2 x 20. RPE 8/10 NPS 0/10. (Pt. Expresses LLE being more difficult than right.)  Hip IR: Seated RTB around ankles, 8' ball between knees. 3x10 bilaterally.    PATIENT EDUCATION:  Education details: Pt. Education related to postural corrections of anterior pelvic tilt, the recovery step during LOB episodes, and increasing ground clearance during gait Person  educated: Patient Education method:  Demonstration Education comprehension: verbalized understanding  HOME EXERCISE PROGRAM: Access Code: [email or web address] URL: [email or web address] Date: 11/22/2023 Prepared by: Dorene Grebe  Exercises - Side Stepping with Resistance at Ankles and Counter Support  - 1 x daily - 7 x weekly - 3 sets - 10 reps - Standing Hip Extension with Resistance at Ankles and Counter Support  - 1 x daily - 7 x weekly - 3 sets - 10 reps - Mini Squat with Counter Support  - 1 x daily - 7 x weekly - 3 sets - 10 reps - Long Sitting Ankle Plantar Flexion with Resistance  - 1 x daily - 7 x weekly - 3 sets - 10 reps  ASSESSMENT:  CLINICAL IMPRESSION:  Today's treatment session prioritized balance/ proprioceptive tasks with CGA for safety.  Pt. challenged with use of Blaze pods and L/R hip abduction touches.  Several small LOB with self-correction during Blaze pods/ step touches.  Pt. Will benefit from skilled physical therapy interventions focused on improving balance impairments to return them to participating in IADLs/ADLs independently with LRAD.  OBJECTIVE IMPAIRMENTS: decreased strength.   ACTIVITY LIMITATIONS:  Transitioning from sitting to standing quickly  PARTICIPATION LIMITATIONS: church  PERSONAL FACTORS:  Neuropathy progression  is also affecting patient's functional outcome.   REHAB POTENTIAL: Good  CLINICAL DECISION MAKING: Evolving/moderate complexity  EVALUATION COMPLEXITY: Moderate   GOALS: Goals reviewed with patient? Yes  SHORT TERM GOALS: Target date: 12/06/2023  Pt. Will be able to progress their exercise participation to 3x per week for a duration of 50 minutes to improve their global endurance and reduce muscle fatigue when ambulating within the community. Baseline: Pt. Exercises 0 times per week outside of PT treatment Goal status: Partially met  2.  Pt. Will demonstrate anterior pelvic tilt postural correction without cuing to improve proximal hip muscular  recruitment and increase LE stability during both static/dynamic ADLs/IADLs. Baseline: Unable to self correct without demonstration Goal status: Partially met  LONG TERM GOALS: Target date: 01/17/2024  Pt. Will be able to walk 333ft. with LRAD and 0 LOB episodes to improve pt. Confidence when running errands in town. Baseline:  Goal status: INITIAL  2.  Pt. Will be able to demonstrate a strong/stable first recovery step during LOB episodes to decrease injury risk associated with forward falls such as a FOOSH. Baseline:  Goal status: INITIAL  3.  Pt. Will reduce their five time sit to stand time from 15.2 seconds to 12 seconds without LOB to demonstrate improved motor coordination with positional transitions. Baseline: 15.2 Seconds with 1 LOB Goal status: INITIAL  4.  Pt. Will increase their B forward reach from 7.5" to 10" to demonstrate improvements in global UE balance/proprioception during ADLs/IADLs. Baseline: 7.5" Bilaterally Goal status: INITIAL   PLAN:  PT FREQUENCY: 1-2x/week  PT DURATION: 8 weeks  PLANNED INTERVENTIONS: 97110-Therapeutic exercises, 97530- Therapeutic activity, [id number]- Neuromuscular re-education, 97535- Self Care, and [id number]- Manual therapy  PLAN FOR NEXT SESSION: Progress LE proprioception exercises   Cammie Mcgee, PT, DPT # [phone] 12/13/2023, 10:41 AM

## 2023-12-15 ENCOUNTER — Ambulatory Visit: Payer: Medicare Other | Admitting: Physical Therapy

## 2023-12-15 ENCOUNTER — Encounter: Payer: Self-pay | Admitting: Physical Therapy

## 2023-12-15 DIAGNOSIS — R293 Abnormal posture: Secondary | ICD-10-CM

## 2023-12-15 DIAGNOSIS — R2689 Other abnormalities of gait and mobility: Secondary | ICD-10-CM

## 2023-12-15 DIAGNOSIS — M6281 Muscle weakness (generalized): Secondary | ICD-10-CM

## 2023-12-15 NOTE — Therapy (Signed)
 OUTPATIENT PHYSICAL THERAPY LOWER EXTREMITY TREATMENT  Patient Name: Alejandra Singh MRN: [id number] DOB:12-May-1941, 83 y.o., female Today's Date: 12/15/2023  END OF SESSION:  PT End of Session - 12/15/23 0943     Visit Number 5    Number of Visits 16    Date for PT Re-Evaluation 01/17/24    PT Start Time 0944    PT Stop Time 1032    PT Time Calculation (min) 48 min    Equipment Utilized During Treatment Gait belt    Activity Tolerance Patient tolerated treatment well    Behavior During Therapy WFL for tasks assessed/performed             Past Medical History:  Diagnosis Date   Arthritis    Essential hypertension    FH: colonic polyps    Frequent PVCs    RV outflow tract   GERD (gastroesophageal reflux disease)    Hyperlipidemia    Hypothyroidism    Iron deficiency anemia    Mild mitral regurgitation    Neuropathy    lower leg   Non-obstructive CAD (coronary artery disease)    a. 08/2015 ETT: Ex time 6:18. No ST/T changes;  b. 09/2016 Cath: LM nl, LAD 50d, small bridging, LCX nl, RCA nl, EF 55-65%-->Med Rx. c. Cor CT 03/2018 calcium score 0, 25-50% LAD.   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ERY [facility];  Service: Ophthalmology;  Laterality: Left;   COLONOSCOPY N/A 08/04/2021   Procedure: COLONOSCOPY;  Surgeon: Jaynie Collins, DO;  Location: [facility] ENDOSCOPY;  Service: Gastroenterology;   Laterality: N/A;   COLONOSCOPY WITH PROPOFOL N/A 07/05/2015   Procedure: COLONOSCOPY WITH PROPOFOL;  Surgeon: Scot Jun, MD;  Location: [facility] ENDOSCOPY;  Service: Endoscopy;  Laterality: N/A;   CYSTOCELE REPAIR N/A 04/29/2020   Procedure: ANTERIOR REPAIR (CYSTOCELE);  Surgeon: Schermerhorn, Ihor Austin, MD;  Location: [facility] ORS;  Service: Gynecology;  Laterality: N/A;   DILATION AND CURETTAGE OF UTERUS     ESOPHAGOGASTRODUODENOSCOPY (EGD) WITH PROPOFOL N/A 03/19/2016   Procedure: ESOPHAGOGASTRODUODENOSCOPY (EGD) WITH PROPOFOL;  Surgeon: Scot Jun, MD;  Location: [facility] ENDOSCOPY;  Service: Endoscopy;  Laterality: N/A;   EYE SURGERY Right 09/17/2004   cataract - DrBrasington - [facility]   TONSILLECTOMY     VAGINAL HYSTERECTOMY N/A 04/29/2020   Procedure: HYSTERECTOMY VAGINAL;  Surgeon: Schermerhorn, Ihor Austin, MD;  Location: [facility] ORS;  Service: Gynecology;  Laterality: N/A;   Patient Active Problem List   Diagnosis Date Noted   Other abnormalities of gait and mobility 11/22/2023   Muscle weakness (generalized) 11/22/2023   Abnormal posture 11/22/2023   Frequent PVCs 03/28/2018   Chest discomfort 03/26/2018   Bradycardia with 31-40 beats per minute 09/21/2016   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   THERAPY DIAG:  Other abnormalities of gait and mobility  Muscle weakness (generalized)  Abnormal posture  Rationale for Evaluation and Treatment: Rehabilitation  ONSET DATE: Chronic  SUBJECTIVE:   SUBJECTIVE STATEMENT:  Pt. Arrives to PT appointment on time and prepared for their evaluation using a SPC. Pt.  Describes that they have experienced difficulties related to maintaining their balance after standing up after prolonged instances of sitting. Pt. Explains that they regularly experience these episodes of dizziness during church, and fear having a fall in public settings. Pt. Expresses that they have a great support group within their church community, but they wish to progress towards returning to ambulating with no AD or the LRAD. Pt. States that they transitioned to using a SPC after having several episodes of unsteadiness on their feet. Pt. Discusses their PMH related to having peripheral neuropathy, with symptoms being worse on the LLE. Pt. Notes that that they are not experiencing any related neuropathic pain 0/10 NPS. Pt. Elaborates that their PLOF was independent with all ADLs/IADLs but they are now challenged by forward reaching/bending activities such as putting away laundry. Pt. Expresses that they would like to exercise more and be able to transition from sitting to standing in church independently and without LOB. Pt. Also notes that they have recently stopped taking their 100 mg Gabapentin and have not felt the need to return to their previous dosage.  PERTINENT HISTORY: LE Neuropathy, LLE more affected than RLE PAIN:  Are you having pain? No  PRECAUTIONS: Fall  RED FLAGS: None   WEIGHT BEARING RESTRICTIONS: No  FALLS:  Has patient fallen in last 6 months? Yes. Number of falls 2  LIVING ENVIRONMENT: Lives with: lives with their family and lives with their spouse Lives in: House/apartment Stairs: Yes: Internal: 14 steps; on right going up Has following equipment at home: Single point cane  OCCUPATION: Retired  PLOF: Independent  PATIENT GOALS:   - Reduce episodes of transitional LOB - Increase weekly exercise frequency - Manage neuropathic symptoms - Return to forward-reaching yard-work activities such as planting flowers - Return to being independent with all  ADLs/IADLs - Increase confidence ambulating within the community  NEXT MD VISIT: N/A  OBJECTIVE:  Note: Objective measures were completed at Evaluation unless otherwise noted.  DIAGNOSTIC FINDINGS:  No imaging  - B LE Weakness - Proximal hip stabilization musculature weakness - Ataxic gait, limited stride length and step width - MCI of B LE related to neuropathy - Decreased sensation to B LE at L4, L5, and S1 dermatomes  PATIENT SURVEYS:  LEFS 44/80 Berg 46/56 (11/30/2022)  COGNITION: Overall cognitive status: Within functional limits for tasks assessed     SENSATION: Light touch: Impaired   EDEMA:  NT  MUSCLE LENGTH: Hamstrings: Right 85 deg; Left 87 deg (Straight leg raise)  POSTURE: anterior pelvic tilt  PALPATION: Minimal distal LE swelling, no signs of pitting edema.  LOWER EXTREMITY ROM:  Active ROM Right eval Left eval  Hip flexion 85 87  Hip extension    Hip abduction    Hip adduction WNL WNL  Hip internal rotation WNL WNL  Hip external rotation    Knee flexion 123 120  Knee extension 0 0  Ankle dorsiflexion 3 0  Ankle plantarflexion 40 35  Ankle inversion    Ankle eversion     (  Blank rows = not tested)  LOWER EXTREMITY MMT:  MMT Right eval Left eval  Hip flexion 3 3  Hip extension 3 3  Hip abduction 3 3  Hip adduction    Hip internal rotation 3 3  Hip external rotation 3 3  Knee flexion 4 4  Knee extension 4 4  Ankle dorsiflexion 3 3  Ankle plantarflexion 4 4  Ankle inversion 3 3  Ankle eversion 3 3   (Blank rows = not tested)  LOWER EXTREMITY SPECIAL TESTS:  Hip special tests: FADDIR (negative)  FUNCTIONAL TESTS:  5 times sit to stand: 15.2 seconds with a LOB episode on repetition 4. Pt. Recovered by sitting back in chair and completed rep 5.  GAIT: Distance walked: 50 ft. Assistive device utilized: Single point cane Level of assistance: Complete Independence Comments: Pt. Demonstrates decreased stride length, increased  step width, decreased hip flexion, an anterior pelvic tilt, forward scapulothoracic posture, and lacks extension during toe-off phase of gait cycle.  Berg balance test: 46/56  (discussed fall risk/ use of SPC)                                                                                                                              TREATMENT DATE: 12/15/2023  Subjective:  Pt. Arrives to treatment session early and prepared.  Pt. States they have been more comfortable going to and from town recently.  Pt. Replies that their legs seem to have more energy throughout the day.  No falls or LOB reported.  Pt. Has appt. With Chiro on Tuesday's and Friday's. Pt. Notes that their back is feeling better sine starting chiropractic treatments in adjunct to PT treatment.   Neuro:  Walking in gym without use of SPC.  Alt. UE/LE touches with CGA for safety.  Several small LOB with self-correction.    Walking in gym with focus on turning L/R.  Sit to stands from gray chair with no UE assist.    Step touches at 6"/ 12" Step with alt. Touches (light UE assist and progressed to no UE assist)- CGA for safety/ cuing.     Therapeutic Activity:  Blaze-pods - Focus setting at star in gym:  6 pods (all planes of movement) forward lunge/toe-tap for recovery step training. 2 x 2 minute continuous reps.  No fatigue reported.     //-Bar obstacle course using two 12" and 6" hurdles, 2 airex pads, and airex balance beam. Min. PT Assist for safety. 3 Laps. RPE 8/10.  Therapeutic Ex:  TRX squats with yellow ball between knees 15x2 with chair behind pt.  Cuing to maintain elbow flexion/ upright posture.    Seated B AAROM Dorsiflexion using GTB. 4# ankle weight on foot 2 x 20. RPE 8/10 NPS 0/10. (Pt. Expresses LLE being more difficult than right.)   PATIENT EDUCATION:  Education details: Pt. Education related to postural corrections of anterior pelvic tilt, the recovery step during LOB episodes, and increasing ground  clearance during gait Person educated: Patient Education method: Demonstration Education comprehension: verbalized understanding  HOME EXERCISE PROGRAM: Access Code: [email or web address] URL: [email or web address] Date: 11/22/2023 Prepared by: Dorene Grebe  Exercises - Side Stepping with Resistance at Ankles and Counter Support  - 1 x daily - 7 x weekly - 3 sets - 10 reps - Standing Hip Extension with Resistance at Ankles and Counter Support  - 1 x daily - 7 x weekly - 3 sets - 10 reps - Mini Squat with Counter Support  - 1 x daily - 7 x weekly - 3 sets - 10 reps - Long Sitting Ankle Plantar Flexion with Resistance  - 1 x daily - 7 x weekly - 3 sets - 10 reps  ASSESSMENT:  CLINICAL IMPRESSION:  Today's treatment session prioritized balance/ proprioceptive tasks with CGA for safety.  Pt. challenged with use of Blaze pods and L/R hip abduction touches. Several small LOB with self-correction during Blaze pods/ step touches.  Pt. Exhibited increased B LE fatigue during today's treatment but worked hard to complete all exercises. PT Educated pt. About HEP compliance and modifying activity on days they experience increased fatigue. Pt. Will benefit from skilled physical therapy interventions focused on improving balance impairments to return them to participating in IADLs/ADLs independently with LRAD.  OBJECTIVE IMPAIRMENTS: decreased strength.   ACTIVITY LIMITATIONS:  Transitioning from sitting to standing quickly  PARTICIPATION LIMITATIONS: church  PERSONAL FACTORS:  Neuropathy progression  is also affecting patient's functional outcome.   REHAB POTENTIAL: Good  CLINICAL DECISION MAKING: Evolving/moderate complexity  EVALUATION COMPLEXITY: Moderate   GOALS: Goals reviewed with patient? Yes  SHORT TERM GOALS: Target date: 12/06/2023  Pt. Will be able to progress their exercise participation to 3x per week for a duration of 50 minutes to improve their global endurance and  reduce muscle fatigue when ambulating within the community. Baseline: Pt. Exercises 0 times per week outside of PT treatment Goal status: Partially met  2.  Pt. Will demonstrate anterior pelvic tilt postural correction without cuing to improve proximal hip muscular recruitment and increase LE stability during both static/dynamic ADLs/IADLs. Baseline: Unable to self correct without demonstration Goal status: Partially met  LONG TERM GOALS: Target date: 01/17/2024  Pt. Will be able to walk 354ft. with LRAD and 0 LOB episodes to improve pt. Confidence when running errands in town. Baseline:  Goal status: INITIAL  2.  Pt. Will be able to demonstrate a strong/stable first recovery step during LOB episodes to decrease injury risk associated with forward falls such as a FOOSH. Baseline:  Goal status: INITIAL  3.  Pt. Will reduce their five time sit to stand time from 15.2 seconds to 12 seconds without LOB to demonstrate improved motor coordination with positional transitions. Baseline: 15.2 Seconds with 1 LOB Goal status: INITIAL  4.  Pt. Will increase their B forward reach from 7.5" to 10" to demonstrate improvements in global UE balance/proprioception during ADLs/IADLs. Baseline: 7.5" Bilaterally Goal status: INITIAL   PLAN:  PT FREQUENCY: 1-2x/week  PT DURATION: 8 weeks  PLANNED INTERVENTIONS: 97110-Therapeutic exercises, 97530- Therapeutic activity, [id number]- Neuromuscular re-education, 97535- Self Care, and [id number]- Manual therapy  PLAN FOR NEXT SESSION: Progress LE proprioception exercises and update current HEP.  Cammie Mcgee, PT, DPT # [phone] 12/15/2023, 12:22 PM

## 2023-12-20 ENCOUNTER — Ambulatory Visit: Payer: Medicare Other | Attending: Student | Admitting: Physical Therapy

## 2023-12-20 ENCOUNTER — Encounter: Payer: Self-pay | Admitting: Physical Therapy

## 2023-12-20 DIAGNOSIS — M6281 Muscle weakness (generalized): Secondary | ICD-10-CM | POA: Diagnosis present

## 2023-12-20 DIAGNOSIS — R293 Abnormal posture: Secondary | ICD-10-CM | POA: Insufficient documentation

## 2023-12-20 DIAGNOSIS — R2689 Other abnormalities of gait and mobility: Secondary | ICD-10-CM | POA: Diagnosis present

## 2023-12-20 NOTE — Therapy (Signed)
 OUTPATIENT PHYSICAL THERAPY LOWER EXTREMITY TREATMENT  Patient Name: Alejandra Singh MRN: [id number] DOB:1941/02/21, 83 y.o., female Today's Date: 12/20/2023  END OF SESSION:  PT End of Session - 12/20/23 0937     Visit Number 6    Number of Visits 16    Date for PT Re-Evaluation 01/17/24    PT Start Time 0934    PT Stop Time 1017    PT Time Calculation (min) 43 min    Equipment Utilized During Treatment Gait belt    Activity Tolerance Patient tolerated treatment well    Behavior During Therapy WFL for tasks assessed/performed             Past Medical History:  Diagnosis Date   Arthritis    Essential hypertension    FH: colonic polyps    Frequent PVCs    RV outflow tract   GERD (gastroesophageal reflux disease)    Hyperlipidemia    Hypothyroidism    Iron deficiency anemia    Mild mitral regurgitation    Neuropathy    lower leg   Non-obstructive CAD (coronary artery disease)    a. 08/2015 ETT: Ex time 6:18. No ST/T changes;  b. 09/2016 Cath: LM nl, LAD 50d, small bridging, LCX nl, RCA nl, EF 55-65%-->Med Rx. c. Cor CT 03/2018 calcium score 0, 25-50% LAD.   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   Vertigo    none for several years   Past Surgical History:  Procedure Laterality Date   ANKLE SURGERY Left    Fracture   CARDIAC CATHETERIZATION N/A 09/22/2016   Procedure: Left Heart Cath and Coronary Angiography;  Surgeon: Marykay Lex, MD;  Location: [facility] INVASIVE CV LAB;  Service: Cardiovascular;  Laterality: N/A;   CATARACT EXTRACTION W/PHACO Left 05/25/2022   Procedure: CATARACT EXTRACTION PHACO AND INTRAOCULAR LENS PLACEMENT (IOC) LEFT 5.42 00:47.4;  Surgeon: Nevada Crane, MD;  Location: [facility];  Service: Ophthalmology;  Laterality: Left;   COLONOSCOPY N/A 08/04/2021   Procedure: COLONOSCOPY;  Surgeon: Jaynie Collins, DO;  Location: [facility] ENDOSCOPY;  Service: Gastroenterology;   Laterality: N/A;   COLONOSCOPY WITH PROPOFOL N/A 07/05/2015   Procedure: COLONOSCOPY WITH PROPOFOL;  Surgeon: Scot Jun, MD;  Location: [facility] ENDOSCOPY;  Service: Endoscopy;  Laterality: N/A;   CYSTOCELE REPAIR N/A 04/29/2020   Procedure: ANTERIOR REPAIR (CYSTOCELE);  Surgeon: Schermerhorn, Ihor Austin, MD;  Location: [facility] ORS;  Service: Gynecology;  Laterality: N/A;   DILATION AND CURETTAGE OF UTERUS     ESOPHAGOGASTRODUODENOSCOPY (EGD) WITH PROPOFOL N/A 03/19/2016   Procedure: ESOPHAGOGASTRODUODENOSCOPY (EGD) WITH PROPOFOL;  Surgeon: Scot Jun, MD;  Location: [facility] ENDOSCOPY;  Service: Endoscopy;  Laterality: N/A;   EYE SURGERY Right 09/17/2004   cataract - Dr[facility]asington - [facility]   TONSILLECTOMY     VAGINAL HYSTERECTOMY N/A 04/29/2020   Procedure: HYSTERECTOMY VAGINAL;  Surgeon: Schermerhorn, Ihor Austin, MD;  Location: [facility] ORS;  Service: Gynecology;  Laterality: N/A;   Patient Active Problem List   Diagnosis Date Noted   Other abnormalities of gait and mobility 11/22/2023   Muscle weakness (generalized) 11/22/2023   Abnormal posture 11/22/2023   Frequent PVCs 03/28/2018   Chest discomfort 03/26/2018   Bradycardia with 31-40 beats per minute 09/21/2016   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   THERAPY DIAG:  Other abnormalities of gait and mobility  Muscle weakness (generalized)  Abnormal posture  Rationale for Evaluation and Treatment: Rehabilitation  ONSET DATE: Chronic  SUBJECTIVE:   SUBJECTIVE STATEMENT:  Pt. Arrives to PT appointment on time and prepared for their evaluation using a SPC. Pt.  Describes that they have experienced difficulties related to maintaining their balance after standing up after prolonged instances of sitting. Pt. Explains that they regularly experience these episodes of dizziness during church, and fear having a fall in public settings. Pt. Expresses that they have a great support group within their church community, but they wish to progress towards returning to ambulating with no AD or the LRAD. Pt. States that they transitioned to using a SPC after having several episodes of unsteadiness on their feet. Pt. Discusses their PMH related to having peripheral neuropathy, with symptoms being worse on the LLE. Pt. Notes that that they are not experiencing any related neuropathic pain 0/10 NPS. Pt. Elaborates that their PLOF was independent with all ADLs/IADLs but they are now challenged by forward reaching/bending activities such as putting away laundry. Pt. Expresses that they would like to exercise more and be able to transition from sitting to standing in church independently and without LOB. Pt. Also notes that they have recently stopped taking their 100 mg Gabapentin and have not felt the need to return to their previous dosage.  PERTINENT HISTORY: LE Neuropathy, LLE more affected than RLE PAIN:  Are you having pain? No  PRECAUTIONS: Fall  RED FLAGS: None   WEIGHT BEARING RESTRICTIONS: No  FALLS:  Has patient fallen in last 6 months? Yes. Number of falls 2  LIVING ENVIRONMENT: Lives with: lives with their family and lives with their spouse Lives in: House/apartment Stairs: Yes: Internal: 14 steps; on right going up Has following equipment at home: Single point cane  OCCUPATION: Retired  PLOF: Independent  PATIENT GOALS:   - Reduce episodes of transitional LOB - Increase weekly exercise frequency - Manage neuropathic symptoms - Return to forward-reaching yard-work activities such as planting flowers - Return to being independent with all  ADLs/IADLs - Increase confidence ambulating within the community  NEXT MD VISIT: N/A  OBJECTIVE:  Note: Objective measures were completed at Evaluation unless otherwise noted.  DIAGNOSTIC FINDINGS:  No imaging  - B LE Weakness - Proximal hip stabilization musculature weakness - Ataxic gait, limited stride length and step width - MCI of B LE related to neuropathy - Decreased sensation to B LE at L4, L5, and S1 dermatomes  PATIENT SURVEYS:  LEFS 44/80 Berg 46/56 (11/30/2022)  COGNITION: Overall cognitive status: Within functional limits for tasks assessed     SENSATION: Light touch: Impaired   EDEMA:  NT  MUSCLE LENGTH: Hamstrings: Right 85 deg; Left 87 deg (Straight leg raise)  POSTURE: anterior pelvic tilt  PALPATION: Minimal distal LE swelling, no signs of pitting edema.  LOWER EXTREMITY ROM:  Active ROM Right eval Left eval  Hip flexion 85 87  Hip extension    Hip abduction    Hip adduction WNL WNL  Hip internal rotation WNL WNL  Hip external rotation    Knee flexion 123 120  Knee extension 0 0  Ankle dorsiflexion 3 0  Ankle plantarflexion 40 35  Ankle inversion    Ankle eversion     (  Blank rows = not tested)  LOWER EXTREMITY MMT:  MMT Right eval Left eval  Hip flexion 3 3  Hip extension 3 3  Hip abduction 3 3  Hip adduction    Hip internal rotation 3 3  Hip external rotation 3 3  Knee flexion 4 4  Knee extension 4 4  Ankle dorsiflexion 3 3  Ankle plantarflexion 4 4  Ankle inversion 3 3  Ankle eversion 3 3   (Blank rows = not tested)  LOWER EXTREMITY SPECIAL TESTS:  Hip special tests: FADDIR (negative)  FUNCTIONAL TESTS:  5 times sit to stand: 15.2 seconds with a LOB episode on repetition 4. Pt. Recovered by sitting back in chair and completed rep 5.  GAIT: Distance walked: 50 ft. Assistive device utilized: Single point cane Level of assistance: Complete Independence Comments: Pt. Demonstrates decreased stride length, increased  step width, decreased hip flexion, an anterior pelvic tilt, forward scapulothoracic posture, and lacks extension during toe-off phase of gait cycle.  Berg balance test: 46/56  (discussed fall risk/ use of SPC)                                                                                                                              TREATMENT DATE: 12/20/2023  Subjective:  Pt. Arrives to treatment session early and prepared.  No falls or LOB reported.  Pt. Has appt. With Chiro on Tuesday's and Friday's.   Therapeutic Ex:  Nustep L4 B UE/LE (consistent cadence)- for 10 minutes.  Discussed weekend activities.    Reviewed HEP  Neuro:  Walking in gym/ hallway without use of SPC.  Alt. UE/LE touches with CGA for safety in //-bas.  Several small LOB with self-correction.    Walking in gym with focus on turning L/R.  Sit to stands from gray chair with no UE assist 12x.  Pt. Limited when sitting on front of chair.    Tandem Airex walking in //-bars with added 6"/12" hurdles with light UE assist required for safety.  Forward/ lateral with light to no UE assist in //-bars.    Seated/ standing alt. UE/LE touches (pt. Challenged in standing and requires CGA for safety during any LOB.    Walking outside without use of SPC in parking, descending stairs with step to pattern and R handrail, ascending stairs with recip. Pattern and R handrail.   Pt. Walked around outside of clinic managing curbs and grass with SBA/ mod. I safely.     NOT TODAY  Therapeutic Activity:  Blaze-pods - Focus setting at star in gym:  6 pods (all planes of movement) forward lunge/toe-tap for recovery step training. 2 x 2 minute continuous reps.  No fatigue reported.     //-Bar obstacle course using two 12" and 6" hurdles, 2 airex pads, and airex balance beam. Min. PT Assist for safety. 3 Laps. RPE 8/10.  TRX squats with yellow ball between knees 15x2 with chair behind pt.  Cuing to  maintain elbow flexion/ upright posture.     Seated B AAROM Dorsiflexion using GTB. 4# ankle weight on foot 2 x 20. RPE 8/10 NPS 0/10. (Pt. Expresses LLE being more difficult than right.)  Step touches at 6"/ 12" Step with alt. Touches (light UE assist and progressed to no UE assist)- CGA for safety/ cuing.      PATIENT EDUCATION:  Education details: Pt. Education related to postural corrections of anterior pelvic tilt, the recovery step during LOB episodes, and increasing ground clearance during gait Person educated: Patient Education method: Demonstration Education comprehension: verbalized understanding  HOME EXERCISE PROGRAM: Access Code: [email or web address] URL: [email or web address] Date: 11/22/2023 Prepared by: Dorene Grebe  Exercises - Side Stepping with Resistance at Ankles and Counter Support  - 1 x daily - 7 x weekly - 3 sets - 10 reps - Standing Hip Extension with Resistance at Ankles and Counter Support  - 1 x daily - 7 x weekly - 3 sets - 10 reps - Mini Squat with Counter Support  - 1 x daily - 7 x weekly - 3 sets - 10 reps - Long Sitting Ankle Plantar Flexion with Resistance  - 1 x daily - 7 x weekly - 3 sets - 10 reps  ASSESSMENT:  CLINICAL IMPRESSION:  Today's treatment session prioritized balance/ proprioceptive tasks with CGA for safety.  Several small LOB with self-correction during sit to stands at front of chair.  Pt. Able to manage curbs/ grassy terrain with increase hip flexion and step pattern without use of SPC.  PT Educated pt. About HEP compliance and modifying activity on days they experience increased fatigue. Pt. Will benefit from skilled physical therapy interventions focused on improving balance impairments to return them to participating in IADLs/ADLs independently with LRAD.  OBJECTIVE IMPAIRMENTS: decreased strength.   ACTIVITY LIMITATIONS:  Transitioning from sitting to standing quickly  PARTICIPATION LIMITATIONS: church  PERSONAL FACTORS:  Neuropathy progression  is also affecting  patient's functional outcome.   REHAB POTENTIAL: Good  CLINICAL DECISION MAKING: Evolving/moderate complexity  EVALUATION COMPLEXITY: Moderate   GOALS: Goals reviewed with patient? Yes  SHORT TERM GOALS: Target date: 12/06/2023  Pt. Will be able to progress their exercise participation to 3x per week for a duration of 50 minutes to improve their global endurance and reduce muscle fatigue when ambulating within the community. Baseline: Pt. Exercises 0 times per week outside of PT treatment Goal status: Partially met  2.  Pt. Will demonstrate anterior pelvic tilt postural correction without cuing to improve proximal hip muscular recruitment and increase LE stability during both static/dynamic ADLs/IADLs. Baseline: Unable to self correct without demonstration Goal status: Partially met  LONG TERM GOALS: Target date: 01/17/2024  Pt. Will be able to walk 32ft. with LRAD and 0 LOB episodes to improve pt. Confidence when running errands in town. Baseline:  Goal status: INITIAL  2.  Pt. Will be able to demonstrate a strong/stable first recovery step during LOB episodes to decrease injury risk associated with forward falls such as a FOOSH. Baseline:  Goal status: INITIAL  3.  Pt. Will reduce their five time sit to stand time from 15.2 seconds to 12 seconds without LOB to demonstrate improved motor coordination with positional transitions. Baseline: 15.2 Seconds with 1 LOB Goal status: INITIAL  4.  Pt. Will increase their B forward reach from 7.5" to 10" to demonstrate improvements in global UE balance/proprioception during ADLs/IADLs. Baseline: 7.5" Bilaterally Goal status: INITIAL   PLAN:  PT FREQUENCY: 1-2x/week  PT DURATION: 8 weeks  PLANNED INTERVENTIONS: 97110-Therapeutic exercises, 97530- Therapeutic activity, [id number]- Neuromuscular re-education, 97535- Self Care, and [id number]- Manual therapy  PLAN FOR NEXT SESSION: Progress LE proprioception exercises and update current  HEP.  Cammie Mcgee, PT, DPT # [phone] 12/20/2023, 12:24 PM

## 2023-12-22 ENCOUNTER — Ambulatory Visit: Payer: Medicare Other | Admitting: Physical Therapy

## 2023-12-27 ENCOUNTER — Ambulatory Visit: Payer: Medicare Other | Admitting: Physical Therapy

## 2023-12-27 ENCOUNTER — Encounter: Payer: Self-pay | Admitting: Physical Therapy

## 2023-12-27 DIAGNOSIS — M6281 Muscle weakness (generalized): Secondary | ICD-10-CM

## 2023-12-27 DIAGNOSIS — R2689 Other abnormalities of gait and mobility: Secondary | ICD-10-CM | POA: Diagnosis not present

## 2023-12-27 DIAGNOSIS — R293 Abnormal posture: Secondary | ICD-10-CM

## 2023-12-27 NOTE — Therapy (Signed)
 OUTPATIENT PHYSICAL THERAPY LOWER EXTREMITY TREATMENT  Patient Name: Alejandra Singh MRN: [id number] DOB:1941-05-30, 83 y.o., female Today's Date: 12/27/2023  END OF SESSION:  PT End of Session - 12/27/23 0941     Visit Number 7    Number of Visits 16    Date for PT Re-Evaluation 01/17/24    PT Start Time 0941    PT Stop Time 1031    PT Time Calculation (min) 50 min    Equipment Utilized During Treatment Gait belt    Activity Tolerance Patient tolerated treatment well    Behavior During Therapy WFL for tasks assessed/performed            Past Medical History:  Diagnosis Date   Arthritis    Essential hypertension    FH: colonic polyps    Frequent PVCs    RV outflow tract   GERD (gastroesophageal reflux disease)    Hyperlipidemia    Hypothyroidism    Iron deficiency anemia    Mild mitral regurgitation    Neuropathy    lower leg   Non-obstructive CAD (coronary artery disease)    a. 08/2015 ETT: Ex time 6:18. No ST/T changes;  b. 09/2016 Cath: LM nl, LAD 50d, small bridging, LCX nl, RCA nl, EF 55-65%-->Med Rx. c. Cor CT 03/2018 calcium score 0, 25-50% LAD.   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   Vertigo    none for several years   Past Surgical History:  Procedure Laterality Date   ANKLE SURGERY Left    Fracture   CARDIAC CATHETERIZATION N/A 09/22/2016   Procedure: Left Heart Cath and Coronary Angiography;  Surgeon: Marykay Lex, MD;  Location: [facility] INVASIVE CV LAB;  Service: Cardiovascular;  Laterality: N/A;   CATARACT EXTRACTION W/PHACO Left 05/25/2022   Procedure: CATARACT EXTRACTION PHACO AND INTRAOCULAR LENS PLACEMENT (IOC) LEFT 5.42 00:47.4;  Surgeon: Nevada Crane, MD;  Location: [facility];  Service: Ophthalmology;  Laterality: Left;   COLONOSCOPY N/A 08/04/2021   Procedure: COLONOSCOPY;  Surgeon: Jaynie Collins, DO;  Location: [facility] ENDOSCOPY;  Service: Gastroenterology;   Laterality: N/A;   COLONOSCOPY WITH PROPOFOL N/A 07/05/2015   Procedure: COLONOSCOPY WITH PROPOFOL;  Surgeon: Scot Jun, MD;  Location: [facility] ENDOSCOPY;  Service: Endoscopy;  Laterality: N/A;   CYSTOCELE REPAIR N/A 04/29/2020   Procedure: ANTERIOR REPAIR (CYSTOCELE);  Surgeon: Schermerhorn, Ihor Austin, MD;  Location: [facility] ORS;  Service: Gynecology;  Laterality: N/A;   DILATION AND CURETTAGE OF UTERUS     ESOPHAGOGASTRODUODENOSCOPY (EGD) WITH PROPOFOL N/A 03/19/2016   Procedure: ESOPHAGOGASTRODUODENOSCOPY (EGD) WITH PROPOFOL;  Surgeon: Scot Jun, MD;  Location: [facility];  Service: Endoscopy;  Laterality: N/A;   EYE SURGERY Right 09/17/2004   cataract - [facility]   TONSILLECTOMY     VAGINAL HYSTERECTOMY N/A 04/29/2020   Procedure: HYSTERECTOMY VAGINAL;  Surgeon: Schermerhorn, Ihor Austin, MD;  Location: [facility] ORS;  Service: Gynecology;  Laterality: N/A;   Patient Active Problem List   Diagnosis Date Noted   Other abnormalities of gait and mobility 11/22/2023   Muscle weakness (generalized) 11/22/2023   Abnormal posture 11/22/2023   Frequent PVCs 03/28/2018   Chest discomfort 03/26/2018   Bradycardia with 31-40 beats per minute 09/21/2016   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   THERAPY DIAG:  Other abnormalities of gait and mobility  Muscle weakness (generalized)  Abnormal posture  Rationale for Evaluation and Treatment: Rehabilitation  ONSET DATE: Chronic  SUBJECTIVE:   SUBJECTIVE STATEMENT:  Pt. Arrives to PT appointment on time and prepared for their evaluation using a SPC. Pt.  Describes that they have experienced difficulties related to maintaining their balance after standing up after prolonged instances of sitting. Pt. Explains that they regularly experience these episodes of dizziness during church, and fear having a fall in public settings. Pt. Expresses that they have a great support group within their church community, but they wish to progress towards returning to ambulating with no AD or the LRAD. Pt. States that they transitioned to using a SPC after having several episodes of unsteadiness on their feet. Pt. Discusses their PMH related to having peripheral neuropathy, with symptoms being worse on the LLE. Pt. Notes that that they are not experiencing any related neuropathic pain 0/10 NPS. Pt. Elaborates that their PLOF was independent with all ADLs/IADLs but they are now challenged by forward reaching/bending activities such as putting away laundry. Pt. Expresses that they would like to exercise more and be able to transition from sitting to standing in church independently and without LOB. Pt. Also notes that they have recently stopped taking their 100 mg Gabapentin and have not felt the need to return to their previous dosage.  PERTINENT HISTORY: LE Neuropathy, LLE more affected than RLE PAIN:  Are you having pain? No  PRECAUTIONS: Fall  RED FLAGS: None   WEIGHT BEARING RESTRICTIONS: No  FALLS:  Has patient fallen in last 6 months? Yes. Number of falls 2  LIVING ENVIRONMENT: Lives with: lives with their family and lives with their spouse Lives in: House/apartment Stairs: Yes: Internal: 14 steps; on right going up Has following equipment at home: Single point cane  OCCUPATION: Retired  PLOF: Independent  PATIENT GOALS:   - Reduce episodes of transitional LOB - Increase weekly exercise frequency - Manage neuropathic symptoms - Return to forward-reaching yard-work activities such as planting flowers - Return to being independent with all  ADLs/IADLs - Increase confidence ambulating within the community  NEXT MD VISIT: N/A  OBJECTIVE:  Note: Objective measures were completed at Evaluation unless otherwise noted.  DIAGNOSTIC FINDINGS:  No imaging  - B LE Weakness - Proximal hip stabilization musculature weakness - Ataxic gait, limited stride length and step width - MCI of B LE related to neuropathy - Decreased sensation to B LE at L4, L5, and S1 dermatomes  PATIENT SURVEYS:  LEFS 44/80 Berg 46/56 (11/30/2022)  COGNITION: Overall cognitive status: Within functional limits for tasks assessed     SENSATION: Light touch: Impaired   EDEMA:  NT  MUSCLE LENGTH: Hamstrings: Right 85 deg; Left 87 deg (Straight leg raise)  POSTURE: anterior pelvic tilt  PALPATION: Minimal distal LE swelling, no signs of pitting edema.  LOWER EXTREMITY ROM:  Active ROM Right eval Left eval  Hip flexion 85 87  Hip extension    Hip abduction    Hip adduction WNL WNL  Hip internal rotation WNL WNL  Hip external rotation    Knee flexion 123 120  Knee extension 0 0  Ankle dorsiflexion 3 0  Ankle plantarflexion 40 35  Ankle inversion    Ankle eversion     (Blank  rows = not tested)  LOWER EXTREMITY MMT:  MMT Right eval Left eval  Hip flexion 3 3  Hip extension 3 3  Hip abduction 3 3  Hip adduction    Hip internal rotation 3 3  Hip external rotation 3 3  Knee flexion 4 4  Knee extension 4 4  Ankle dorsiflexion 3 3  Ankle plantarflexion 4 4  Ankle inversion 3 3  Ankle eversion 3 3   (Blank rows = not tested)  LOWER EXTREMITY SPECIAL TESTS:  Hip special tests: FADDIR (negative)  FUNCTIONAL TESTS:  5 times sit to stand: 15.2 seconds with a LOB episode on repetition 4. Pt. Recovered by sitting back in chair and completed rep 5.  GAIT: Distance walked: 50 ft. Assistive device utilized: Single point cane Level of assistance: Complete Independence Comments: Pt. Demonstrates decreased stride length, increased  step width, decreased hip flexion, an anterior pelvic tilt, forward scapulothoracic posture, and lacks extension during toe-off phase of gait cycle.  Berg balance test: 46/56  (discussed fall risk/ use of SPC)                                                                                                                              TREATMENT DATE: 12/27/2023  Subjective:  Pt. Arrives to treatment session early and prepared.  No falls or LOB reported.  Pt. Reports she is doing better overall and knee is feeling better.    Therapeutic Ex:  Nustep L4 B UE/LE (consistent cadence)- for 10 minutes.  Discussed weekend activities.    Reviewed HEP  Neuro:  Walking in gym/ hallway without use SPC, with SPC and with rollator to assess gait pattern and independence/ safety.  Pt. Not interested in using rollator at this time and PT states   Sit to stands from gray chair with no UE assist 10x.  Pt. Limited when sitting on front of chair.    Tandem Airex walking in //-bars (forward/ lateral)- 4 laps each with CGA for safety.  6"/12" hurdles with light UE assist required for safety.    Walking outside without use of SPC in parking lot.  No issues.  Pt. Able to descend curb without use of SPC.     NOT TODAY  Therapeutic Activity:  Blaze-pods - Focus setting at star in gym:  6 pods (all planes of movement) forward lunge/toe-tap for recovery step training. 2 x 2 minute continuous reps.  No fatigue reported.     //-Bar obstacle course using two 12" and 6" hurdles, 2 airex pads, and airex balance beam. Min. PT Assist for safety. 3 Laps. RPE 8/10.  TRX squats with yellow ball between knees 15x2 with chair behind pt.  Cuing to maintain elbow flexion/ upright posture.    Seated B AAROM Dorsiflexion using GTB. 4# ankle weight on foot 2 x 20. RPE 8/10 NPS 0/10. (Pt. Expresses LLE being more difficult than right.)  Step touches at 6"/ 12" Step with  alt. Touches (light UE assist and progressed to no UE  assist)- CGA for safety/ cuing.      PATIENT EDUCATION:  Education details: Pt. Education related to postural corrections of anterior pelvic tilt, the recovery step during LOB episodes, and increasing ground clearance during gait Person educated: Patient Education method: Demonstration Education comprehension: verbalized understanding  HOME EXERCISE PROGRAM: Access Code: [email or web address] URL: [email or web address] Date: 11/22/2023 Prepared by: Dorene Grebe  Exercises - Side Stepping with Resistance at Ankles and Counter Support  - 1 x daily - 7 x weekly - 3 sets - 10 reps - Standing Hip Extension with Resistance at Ankles and Counter Support  - 1 x daily - 7 x weekly - 3 sets - 10 reps - Mini Squat with Counter Support  - 1 x daily - 7 x weekly - 3 sets - 10 reps - Long Sitting Ankle Plantar Flexion with Resistance  - 1 x daily - 7 x weekly - 3 sets - 10 reps  ASSESSMENT:  CLINICAL IMPRESSION:  Today's treatment session prioritized balance/ proprioceptive tasks with CGA for safety.  Several small LOB with self-correction during sit to stands at front of chair.  Pt. Able to manage curbs and step pattern without use of SPC.  PT Educated pt. About HEP compliance and modifying activity on days they experience increased fatigue. Pt. Will benefit from skilled physical therapy interventions focused on improving balance impairments to return them to participating in IADLs/ADLs independently with LRAD.  OBJECTIVE IMPAIRMENTS: decreased strength.   ACTIVITY LIMITATIONS:  Transitioning from sitting to standing quickly  PARTICIPATION LIMITATIONS: church  PERSONAL FACTORS:  Neuropathy progression  is also affecting patient's functional outcome.   REHAB POTENTIAL: Good  CLINICAL DECISION MAKING: Evolving/moderate complexity  EVALUATION COMPLEXITY: Moderate   GOALS: Goals reviewed with patient? Yes  SHORT TERM GOALS: Target date: 12/06/2023  Pt. Will be able to progress their  exercise participation to 3x per week for a duration of 50 minutes to improve their global endurance and reduce muscle fatigue when ambulating within the community. Baseline: Pt. Exercises 0 times per week outside of PT treatment Goal status: Partially met  2.  Pt. Will demonstrate anterior pelvic tilt postural correction without cuing to improve proximal hip muscular recruitment and increase LE stability during both static/dynamic ADLs/IADLs. Baseline: Unable to self correct without demonstration Goal status: Partially met  LONG TERM GOALS: Target date: 01/17/2024  Pt. Will be able to walk 367ft. with LRAD and 0 LOB episodes to improve pt. Confidence when running errands in town. Baseline:  Goal status: INITIAL  2.  Pt. Will be able to demonstrate a strong/stable first recovery step during LOB episodes to decrease injury risk associated with forward falls such as a FOOSH. Baseline:  Goal status: INITIAL  3.  Pt. Will reduce their five time sit to stand time from 15.2 seconds to 12 seconds without LOB to demonstrate improved motor coordination with positional transitions. Baseline: 15.2 Seconds with 1 LOB Goal status: INITIAL  4.  Pt. Will increase their B forward reach from 7.5" to 10" to demonstrate improvements in global UE balance/proprioception during ADLs/IADLs. Baseline: 7.5" Bilaterally Goal status: INITIAL   PLAN:  PT FREQUENCY: 1-2x/week  PT DURATION: 8 weeks  PLANNED INTERVENTIONS: 97110-Therapeutic exercises, 97530- Therapeutic activity, [id number]- Neuromuscular re-education, 97535- Self Care, and [id number]- Manual therapy  PLAN FOR NEXT SESSION: Progress LE proprioception exercises and update current HEP.  Cammie Mcgee, PT, DPT # [phone] 12/27/2023, 12:11 PM

## 2023-12-29 ENCOUNTER — Ambulatory Visit: Payer: Medicare Other | Admitting: Physical Therapy

## 2023-12-29 ENCOUNTER — Encounter: Payer: Self-pay | Admitting: Physical Therapy

## 2023-12-29 DIAGNOSIS — M6281 Muscle weakness (generalized): Secondary | ICD-10-CM

## 2023-12-29 DIAGNOSIS — R293 Abnormal posture: Secondary | ICD-10-CM

## 2023-12-29 DIAGNOSIS — R2689 Other abnormalities of gait and mobility: Secondary | ICD-10-CM | POA: Diagnosis not present

## 2023-12-29 NOTE — Therapy (Signed)
 OUTPATIENT PHYSICAL THERAPY LOWER EXTREMITY TREATMENT/ DISCHARGE  Patient Name: Alejandra Singh MRN: [id number] DOB:[birth year], 83 y.o., female Today's Date: 12/29/2023  END OF SESSION:  PT End of Session - 12/29/23 0934     Visit Number 8    Number of Visits 16    Date for PT Re-Evaluation 01/17/24    PT Start Time 0941    PT Stop Time 1027    PT Time Calculation (min) 46 min    Equipment Utilized During Treatment Gait belt    Activity Tolerance Patient tolerated treatment well    Behavior During Therapy WFL for tasks assessed/performed            Past Medical History:  Diagnosis Date   Arthritis    Essential hypertension    FH: colonic polyps    Frequent PVCs    RV outflow tract   GERD (gastroesophageal reflux disease)    Hyperlipidemia    Hypothyroidism    Iron deficiency anemia    Mild mitral regurgitation    Neuropathy    lower leg   Non-obstructive CAD (coronary artery disease)    a. 08/2015 ETT: Ex time 6:18. No ST/T changes;  b. 09/2016 Cath: LM nl, LAD 50d, small bridging, LCX nl, RCA nl, EF 55-65%-->Med Rx. c. Cor CT 03/2018 calcium score 0, 25-50% LAD.   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   Vertigo    none for several years   Past Surgical History:  Procedure Laterality Date   ANKLE SURGERY Left    Fracture   CARDIAC CATHETERIZATION N/A 09/22/2016   Procedure: Left Heart Cath and Coronary Angiography;  Surgeon: Marykay Lex, MD;  Location: [facility] INVASIVE CV LAB;  Service: Cardiovascular;  Laterality: N/A;   CATARACT EXTRACTION W/PHACO Left 05/25/2022   Procedure: CATARACT EXTRACTION PHACO AND INTRAOCULAR LENS PLACEMENT (IOC) LEFT 5.42 00:47.4;  Surgeon: Nevada Crane, MD;  Location: [facility] SURGERY CNTR;  Service: Ophthalmology;  Laterality: Left;   COLONOSCOPY N/A 08/04/2021   Procedure: COLONOSCOPY;  Surgeon: Jaynie Collins, DO;  Location: [facility] ENDOSCOPY;  Service:  Gastroenterology;  Laterality: N/A;   COLONOSCOPY WITH PROPOFOL N/A 07/05/2015   Procedure: COLONOSCOPY WITH PROPOFOL;  Surgeon: Scot Jun, MD;  Location: [facility] ENDOSCOPY;  Service: Endoscopy;  Laterality: N/A;   CYSTOCELE REPAIR N/A 04/29/2020   Procedure: ANTERIOR REPAIR (CYSTOCELE);  Surgeon: Schermerhorn, Ihor Austin, MD;  Location: [facility] ORS;  Service: Gynecology;  Laterality: N/A;   DILATION AND CURETTAGE OF UTERUS     ESOPHAGOGASTRODUODENOSCOPY (EGD) WITH PROPOFOL N/A 03/19/2016   Procedure: ESOPHAGOGASTRODUODENOSCOPY (EGD) WITH PROPOFOL;  Surgeon: Scot Jun, MD;  Location: [facility] ENDOSCOPY;  Service: Endoscopy;  Laterality: N/A;   EYE SURGERY Right 09/17/2004   cataract - [facility]   TONSILLECTOMY     VAGINAL HYSTERECTOMY N/A 04/29/2020   Procedure: HYSTERECTOMY VAGINAL;  Surgeon: Schermerhorn, Ihor Austin, MD;  Location: [facility] ORS;  Service: Gynecology;  Laterality: N/A;   Patient Active Problem List   Diagnosis Date Noted   Other abnormalities of gait and mobility 11/22/2023   Muscle weakness (generalized) 11/22/2023   Abnormal posture 11/22/2023   Frequent PVCs 03/28/2018   Chest discomfort 03/26/2018   Bradycardia with 31-40 beats per minute 09/21/2016   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   THERAPY DIAG:  Other abnormalities of gait and mobility  Muscle weakness (generalized)  Abnormal posture  Rationale for Evaluation and Treatment: Rehabilitation  ONSET DATE: Chronic  SUBJECTIVE:   SUBJECTIVE STATEMENT:  Pt. Arrives to PT appointment on time and prepared for their evaluation  using a SPC. Pt. Describes that they have experienced difficulties related to maintaining their balance after standing up after prolonged instances of sitting. Pt. Explains that they regularly experience these episodes of dizziness during church, and fear having a fall in public settings. Pt. Expresses that they have a great support group within their church community, but they wish to progress towards returning to ambulating with no AD or the LRAD. Pt. States that they transitioned to using a SPC after having several episodes of unsteadiness on their feet. Pt. Discusses their PMH related to having peripheral neuropathy, with symptoms being worse on the LLE. Pt. Notes that that they are not experiencing any related neuropathic pain 0/10 NPS. Pt. Elaborates that their PLOF was independent with all ADLs/IADLs but they are now challenged by forward reaching/bending activities such as putting away laundry. Pt. Expresses that they would like to exercise more and be able to transition from sitting to standing in church independently and without LOB. Pt. Also notes that they have recently stopped taking their 100 mg Gabapentin and have not felt the need to return to their previous dosage.  PERTINENT HISTORY: LE Neuropathy, LLE more affected than RLE PAIN:  Are you having pain? No  PRECAUTIONS: Fall  RED FLAGS: None   WEIGHT BEARING RESTRICTIONS: No  FALLS:  Has patient fallen in last 6 months? Yes. Number of falls 2  LIVING ENVIRONMENT: Lives with: lives with their family and lives with their spouse Lives in: House/apartment Stairs: Yes: Internal: 14 steps; on right going up Has following equipment at home: Single point cane  OCCUPATION: Retired  PLOF: Independent  PATIENT GOALS:   - Reduce episodes of transitional LOB - Increase weekly exercise frequency - Manage neuropathic symptoms - Return to forward-reaching yard-work activities such as planting flowers - Return to being independent  with all ADLs/IADLs - Increase confidence ambulating within the community  NEXT MD VISIT: N/A  OBJECTIVE:  Note: Objective measures were completed at Evaluation unless otherwise noted.  DIAGNOSTIC FINDINGS:  No imaging  - B LE Weakness - Proximal hip stabilization musculature weakness - Ataxic gait, limited stride length and step width - MCI of B LE related to neuropathy - Decreased sensation to B LE at L4, L5, and S1 dermatomes  PATIENT SURVEYS:  LEFS 44/80 Berg 46/56 (11/30/2022)  COGNITION: Overall cognitive status: Within functional limits for tasks assessed     SENSATION: Light touch: Impaired   EDEMA:  NT  MUSCLE LENGTH: Hamstrings: Right 85 deg; Left 87 deg (Straight leg raise)  POSTURE: anterior pelvic tilt  PALPATION: Minimal distal LE swelling, no signs of pitting edema.  LOWER EXTREMITY ROM:  Active ROM Right eval Left eval  Hip flexion 85 87  Hip extension    Hip abduction    Hip adduction WNL WNL  Hip internal rotation WNL WNL  Hip external rotation    Knee flexion 123 120  Knee extension 0 0  Ankle dorsiflexion 3 0  Ankle plantarflexion 40 35  Ankle inversion    Ankle eversion     (  Blank rows = not tested)  LOWER EXTREMITY MMT:  MMT Right eval Left eval  Hip flexion 3 3  Hip extension 3 3  Hip abduction 3 3  Hip adduction    Hip internal rotation 3 3  Hip external rotation 3 3  Knee flexion 4 4  Knee extension 4 4  Ankle dorsiflexion 3 3  Ankle plantarflexion 4 4  Ankle inversion 3 3  Ankle eversion 3 3   (Blank rows = not tested)  LOWER EXTREMITY SPECIAL TESTS:  Hip special tests: FADDIR (negative)  FUNCTIONAL TESTS:  5 times sit to stand: 15.2 seconds with a LOB episode on repetition 4. Pt. Recovered by sitting back in chair and completed rep 5.  GAIT: Distance walked: 50 ft. Assistive device utilized: Single point cane Level of assistance: Complete Independence Comments: Pt. Demonstrates decreased stride length,  increased step width, decreased hip flexion, an anterior pelvic tilt, forward scapulothoracic posture, and lacks extension during toe-off phase of gait cycle.  Berg balance test: 46/56  (discussed fall risk/ use of SPC)                                                                                                                              TREATMENT DATE: 12/29/2023  Subjective:  Pt. Arrives to treatment session early and prepared.  No falls or LOB reported.  Pt. Reports she is doing better overall and knee is feeling better.    Therapeutic Ex:  Nustep L4 B UE/LE (consistent cadence)- for 10 minutes.  Discussed weekend activities.    B LE strength MMT reassessment/ updated goals.   Reviewed HEP  Neuro:  Walking in gym/ hallway without use SPC to assess gait pattern and independence/ safety.  Ambulate at agility ladder working on consistent step pattern/ length without SPC.    Walking outside without use of SPC in parking lot.  Walking on grassy terrain/ curbs around PT clinic.     Sit to stands from gray chair with no UE assist 10x.  Added Airex under feet with STS 5x (challenged).  Pt. Limited when sitting on front of chair.    Tandem Airex walking in //-bars (forward/ lateral)- 2 laps each with CGA for safety.    Pt. Trusts L LE more than R LE.    Berg balance: 48/56 (slight improvement)- pt. Discussed 3-wheeled walker and reports she is not ready to use at this time but maybe in the future.    Walking in hallway alt. UE/LE touches (CGA).   Walking outside.  PATIENT EDUCATION:  Education details: Pt. Education related to postural corrections of anterior pelvic tilt, the recovery step during LOB episodes, and increasing ground clearance during gait Person educated: Patient Education method: Demonstration Education comprehension: verbalized understanding  HOME EXERCISE PROGRAM: Access Code: [email or web address] URL: [email or web address] Date: 11/22/2023 Prepared by:  Dorene Grebe  Exercises - Side Stepping with Resistance at Ankles and Counter Support  - 1 x daily - 7  x weekly - 3 sets - 10 reps - Standing Hip Extension with Resistance at Ankles and Counter Support  - 1 x daily - 7 x weekly - 3 sets - 10 reps - Mini Squat with Counter Support  - 1 x daily - 7 x weekly - 3 sets - 10 reps - Long Sitting Ankle Plantar Flexion with Resistance  - 1 x daily - 7 x weekly - 3 sets - 10 reps  ASSESSMENT:  CLINICAL IMPRESSION:  Today's treatment session prioritized balance/ proprioceptive tasks with CGA for safety.  Several small LOB with self-correction during sit to stands at front of chair.  Pt. Able to manage curbs and step pattern without use of SPC.  PT Educated pt. About HEP compliance and modifying activity on days they experience increased fatigue. Pt. Ready for discharge from PT at this time and instructed to contact PT if any questions or concerns.    OBJECTIVE IMPAIRMENTS: decreased strength.   ACTIVITY LIMITATIONS:  Transitioning from sitting to standing quickly  PARTICIPATION LIMITATIONS: church  PERSONAL FACTORS:  Neuropathy progression  is also affecting patient's functional outcome.   REHAB POTENTIAL: Good  CLINICAL DECISION MAKING: Evolving/moderate complexity  EVALUATION COMPLEXITY: Moderate   GOALS: Goals reviewed with patient? Yes  SHORT TERM GOALS: Target date: 12/06/2023  Pt. Will be able to progress their exercise participation to 3x per week for a duration of 50 minutes to improve their global endurance and reduce muscle fatigue when ambulating within the community. Baseline: Pt. Exercises 0 times per week outside of PT treatment Goal status: Partially met  2.  Pt. Will demonstrate anterior pelvic tilt postural correction without cuing to improve proximal hip muscular recruitment and increase LE stability during both static/dynamic ADLs/IADLs. Baseline: Unable to self correct without demonstration Goal status: Partially  met  LONG TERM GOALS: Target date: 01/17/2024  Pt. Will be able to walk 334ft. with LRAD and 0 LOB episodes to improve pt. Confidence when running errands in town. Baseline:  Goal status: Goal met  2.  Pt. Will be able to demonstrate a strong/stable first recovery step during LOB episodes to decrease injury risk associated with forward falls such as a FOOSH. Baseline:  No recent LOB/ falls but requires UE assist/ SPC Goal status: Partially met  3.  Pt. Will reduce their five time sit to stand time from 15.2 seconds to 12 seconds without LOB to demonstrate improved motor coordination with positional transitions. Baseline: 15.2 Seconds with 1 LOB.   Goal status: Not tested  4.  Pt. Will increase their B forward reach from 7.5" to 10" to demonstrate improvements in global UE balance/proprioception during ADLs/IADLs. Baseline: 7.5" Bilaterally.  3/12: 10" Goal status: Goal met   PLAN:  PT FREQUENCY: 1-2x/week  PT DURATION: 8 weeks  PLANNED INTERVENTIONS: 97110-Therapeutic exercises, 97530- Therapeutic activity, [id number]- Neuromuscular re-education, 97535- Self Care, and 16109- Manual therapy  PLAN FOR NEXT SESSION: Discharge  Cammie Mcgee, PT, DPT # [phone] 12/29/2023, 2:12 PM

## 2024-01-31 ENCOUNTER — Ambulatory Visit: Payer: Medicare Other | Admitting: Cardiology

## 2024-02-03 ENCOUNTER — Encounter: Payer: Self-pay | Admitting: Cardiology

## 2024-02-03 ENCOUNTER — Ambulatory Visit: Payer: Medicare Other | Attending: Cardiology | Admitting: Cardiology

## 2024-02-03 VITALS — BP 140/74 | HR 64 | Ht 65.0 in | Wt 137.8 lb

## 2024-02-03 DIAGNOSIS — I493 Ventricular premature depolarization: Secondary | ICD-10-CM | POA: Diagnosis not present

## 2024-02-03 DIAGNOSIS — I10 Essential (primary) hypertension: Secondary | ICD-10-CM | POA: Diagnosis present

## 2024-02-03 DIAGNOSIS — I2510 Atherosclerotic heart disease of native coronary artery without angina pectoris: Secondary | ICD-10-CM | POA: Insufficient documentation

## 2024-02-03 NOTE — Patient Instructions (Signed)
 Medication Instructions:  Your physician recommends that you continue on your current medications as directed. Please refer to the Current Medication list given to you today.   Labwork: None today  Testing/Procedures: None today  Follow-Up: 6 months  Any Other Special Instructions Will Be Listed Below (If Applicable).  If you need a refill on your cardiac medications before your next appointment, please call your pharmacy.

## 2024-02-03 NOTE — Progress Notes (Signed)
    Cardiology Office Note  Date: 02/03/2024   ID: Alejandra Singh, DOB 10-30-1940, MRN [id number]  History of Present Illness: Alejandra Singh is an 83 y.o. female last seen in October 2024.  She is here for a routine visit.  Reports no palpitations, no dizziness or syncope.  No specific change in stamina since last visit, she did undergo a round of PT to help strengthen her legs and improve balance.  She does not describe any exertional chest pain.  Follow-up ischemic testing in October 2024 was low risk.  We went over her medications.  She reports compliance with therapy.  We increased her Norvasc to 5 mg daily at last visit.  Home blood pressure measurements actually show good control despite today's measurements suggesting component of whitecoat hypertension as well.  I reviewed her recent lab work.  Physical Exam: VS:  BP (!) 140/74 (BP Location: Right Arm)   Pulse 64   Ht 5\' 5"  (1.651 m)   Wt 137 lb 12.8 oz (62.5 kg)   SpO2 97%   BMI 22.93 kg/m , BMI Body mass index is 22.93 kg/m.  Wt Readings from Last 3 Encounters:  02/03/24 137 lb 12.8 oz (62.5 kg)  11/24/23 140 lb 12.8 oz (63.9 kg)  08/23/23 138 lb (62.6 kg)    General: Patient appears comfortable at rest. HEENT: Conjunctiva and lids normal. Neck: Supple, no elevated JVP or carotid bruits. Lungs: Clear to auscultation, nonlabored breathing at rest. Cardiac: Regular rate and rhythm, no S3 or significant systolic murmur, no pericardial rub. Extremities: No pitting edema.  ECG:  An ECG dated 07/26/2023 was personally reviewed today and demonstrated:  Sinus bradycardia with nonspecific ST changes, QTc 406 ms.  Labwork:  March 2025: BUN 13, creatinine 0.51, GFR 93, potassium 4.1, AST 17, ALT 16, cholesterol 162, triglycerides 73, HDL 79, LDL 69, TSH 2.04  Other Studies Reviewed Today:  Lexiscan Myoview 08/05/2023:   Baseline and stress EKG showed 0.5 mm horizontal ST depression in V5 and V6. Stress ECG is negative  for ischemia.   LV perfusion is normal. There is no evidence of ischemia. There is no evidence of infarction.   Left ventricular function is normal. Nuclear stress EF: 74%.   Findings are consistent with no ischemia. The study is low risk.  Assessment and Plan:  1.  Mild nonobstructive CAD assessed by coronary CTA in 2019 with calcium score of 0 and 25 to 50% LAD stenosis status been managed medically.  Follow-up Lexiscan Myoview in October 2024 was negative for ischemia with LVEF 74%.  She does not report any angina.  Continue aspirin 81 mg daily and Crestor 5 mg daily.  Recent LDL 69.   2.  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  She remains on flecainide 50 mg twice daily.  Asymptomatic with no significant palpitations, no dizziness or syncope.  Heart rate regular without ectopy today.   3.  Primary hypertension, also whitecoat hypertension per review of home blood pressure checks.  Continue Norvasc 5 mg daily.  Disposition:  Follow up  6 months.  Signed, Gerard Knight, M.D., F.A.C.C. [facility] at [facility]

## 2024-05-02 ENCOUNTER — Other Ambulatory Visit: Payer: Self-pay | Admitting: Cardiology

## 2024-05-08 ENCOUNTER — Other Ambulatory Visit: Payer: Self-pay | Admitting: Cardiology

## 2024-06-13 ENCOUNTER — Other Ambulatory Visit: Payer: Self-pay | Admitting: Cardiology

## 2024-06-29 ENCOUNTER — Other Ambulatory Visit
Admission: RE | Admit: 2024-06-29 | Discharge: 2024-06-29 | Disposition: A | Discharge status: Home / Self Care | Attending: Urology | Admitting: Urology

## 2024-06-29 ENCOUNTER — Ambulatory Visit (INDEPENDENT_AMBULATORY_CARE_PROVIDER_SITE_OTHER): Admitting: Urology

## 2024-06-29 ENCOUNTER — Other Ambulatory Visit: Payer: Self-pay

## 2024-06-29 VITALS — BP 133/68 | HR 62 | Wt 135.6 lb

## 2024-06-29 DIAGNOSIS — N390 Urinary tract infection, site not specified: Secondary | ICD-10-CM | POA: Diagnosis not present

## 2024-06-29 DIAGNOSIS — Z87440 Personal history of urinary (tract) infections: Secondary | ICD-10-CM | POA: Diagnosis not present

## 2024-06-29 DIAGNOSIS — N958 Other specified menopausal and perimenopausal disorders: Secondary | ICD-10-CM

## 2024-06-29 LAB — URINALYSIS, COMPLETE (UACMP) WITH MICROSCOPIC
Bilirubin Urine: NEGATIVE
Glucose, UA: NEGATIVE mg/dL
Ketones, ur: NEGATIVE mg/dL
Nitrite: NEGATIVE
Specific Gravity, Urine: 1.015 (ref 1.005–1.030)
WBC, UA: >50 WBC/hpf (ref 0–5)
pH: 6 (ref 5.0–8.0)

## 2024-06-29 MED ORDER — ESTRADIOL 0.1 MG/GM VA CREA: TOPICAL_CREAM | VAGINAL | 12 refills | Status: AC

## 2024-06-29 MED ORDER — AMOXICILLIN 875 MG PO TABS: 875.0000 mg | ORAL_TABLET | Freq: Two times a day (BID) | ORAL | 0 refills | Status: DC

## 2024-06-29 NOTE — Progress Notes (Signed)
   06/29/2024 2:58 PM   Alejandra FIDALGO Dec 06, 1940 [id number]  Reason for visit: UTI symptoms  History: History of recurrent UTIs, has had significant improvement on topical estrogen cream  Physical Exam: BP 133/68 (BP Location: Left Arm, Patient Position: Sitting, Cuff Size: Normal)   Pulse 62   Wt 135 lb 9.6 oz (61.5 kg)   SpO2 95%   BMI 22.57 kg/m   Imaging/labs: Urinalysis today large leukocytes, greater than 50 WBC, 0-5 RBC, many bacteria, will send for culture  Today: Reports a few weeks of urgency/frequency, pelvic pressure, foul-smelling urine, concern for UTI She denies any fever or chills or flank pain, no gross hematuria  Plan:   Will send urine for culture, 5 days amoxicillin  for UTI based on prior sensitivities Continue topical estrogen cream, refilled   Redell JAYSON Burnet, MD  [facility] [address] [place], [place] [address] [phone]

## 2024-07-01 LAB — URINE CULTURE: Culture: >=100000 — AB

## 2024-07-03 ENCOUNTER — Ambulatory Visit: Payer: Self-pay | Admitting: Urology

## 2024-07-03 DIAGNOSIS — B9620 Unspecified Escherichia coli [E. coli] as the cause of diseases classified elsewhere: Secondary | ICD-10-CM

## 2024-07-04 MED ORDER — CEPHALEXIN 500 MG PO CAPS: 500.0000 mg | ORAL_CAPSULE | Freq: Two times a day (BID) | ORAL | 0 refills | Status: AC

## 2024-07-04 NOTE — Telephone Encounter (Signed)
 Called pt informed of the need to d/c Amoxicillin  and pick up RX for Keflex , pt voiced understanding. RX sent.

## 2024-08-25 ENCOUNTER — Other Ambulatory Visit: Payer: Self-pay | Admitting: Family Medicine

## 2024-08-25 DIAGNOSIS — Z1231 Encounter for screening mammogram for malignant neoplasm of breast: Secondary | ICD-10-CM

## 2024-08-28 ENCOUNTER — Ambulatory Visit: Payer: Self-pay | Admitting: Urology

## 2024-09-01 ENCOUNTER — Encounter: Payer: Self-pay | Admitting: Cardiology

## 2024-09-01 ENCOUNTER — Ambulatory Visit: Attending: Cardiology | Admitting: Cardiology

## 2024-09-01 VITALS — BP 144/68 | HR 62 | Ht 65.0 in | Wt 134.0 lb

## 2024-09-01 DIAGNOSIS — I10 Essential (primary) hypertension: Secondary | ICD-10-CM | POA: Insufficient documentation

## 2024-09-01 DIAGNOSIS — I493 Ventricular premature depolarization: Secondary | ICD-10-CM | POA: Insufficient documentation

## 2024-09-01 DIAGNOSIS — I2510 Atherosclerotic heart disease of native coronary artery without angina pectoris: Secondary | ICD-10-CM | POA: Diagnosis not present

## 2024-09-01 MED ORDER — AMLODIPINE BESYLATE 5 MG PO TABS: 5.0000 mg | ORAL_TABLET | Freq: Every day | ORAL | 2 refills | Status: AC

## 2024-09-01 NOTE — Patient Instructions (Signed)
 Medication Instructions:   Your physician recommends that you continue on your current medications as directed. Please refer to the Current Medication list given to you today.  I sent in a refill for Amlodipine  5 mg daily  Labwork: None today  Testing/Procedures: None today  Follow-Up: 6 months  Any Other Special Instructions Will Be Listed Below (If Applicable).  If you need a refill on your cardiac medications before your next appointment, please call your pharmacy.

## 2024-09-01 NOTE — Progress Notes (Signed)
    Cardiology Office Note  Date: 09/01/2024   ID: Alejandra Singh, DOB Oct 16, 1941, MRN [id number]  History of Present Illness: Alejandra Singh is an 83 y.o. female last seen in April.  She is here today for a follow-up visit.  Reports no palpitations, no dizziness or syncope.  No exertional chest pain.  She continues to track blood pressure at home, systolics have been getting up into the 140s.  She looked at her Norvasc  bottle at home and pills are listed at 2.5 mg daily, we had increased this to 5 mg daily before.  Otherwise no changes in her cardiac regimen.  I reviewed her lab work from March which is noted below.  I reviewed her ECG today which shows sinus rhythm with prolonged PR interval, nonspecific ST changes, QRS duration 90 ms..  Physical Exam: VS:  BP (!) 144/68 (BP Location: Left Arm, Cuff Size: Normal)   Pulse 62   Ht 5' 5 (1.651 m)   Wt 134 lb (60.8 kg)   SpO2 95%   BMI 22.30 kg/m , BMI Body mass index is 22.3 kg/m.  Wt Readings from Last 3 Encounters:  09/01/24 134 lb (60.8 kg)  06/29/24 135 lb 9.6 oz (61.5 kg)  02/03/24 137 lb 12.8 oz (62.5 kg)    General: Patient appears comfortable at rest. HEENT: Conjunctiva and lids normal. Neck: Supple, no elevated JVP or carotid bruits. Lungs: Clear to auscultation, nonlabored breathing at rest. Cardiac: Regular rate and rhythm, no S3 or significant systolic murmur.  ECG:  An ECG dated 07/26/2023 was personally reviewed today and demonstrated:  Sinus bradycardia with nonspecific ST changes.  Labwork:  March 2025: BUN 13, creatinine 0.51, GFR 93, potassium 4.1, AST 17, ALT 16, cholesterol 162, triglycerides 73, HDL 79, LDL 69, TSH 2.04  Other Studies Reviewed Today:  No interval cardiac testing for review today.  Assessment and Plan:  1.  Mild nonobstructive CAD assessed by coronary CTA in 2019 with calcium  score of 0 and 25 to 50% LAD stenosis status been managed medically.  Follow-up Lexiscan  Myoview  in  October 2024 was negative for ischemia with LVEF 74%.  She is asymptomatic, no interval angina.  Continue aspirin  81 mg daily and Crestor  5 mg daily.   2.  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  She remains on flecainide  50 mg twice daily.  No palpitations or syncope.  ECG reviewed.  Continue with current regimen.   3.  Primary hypertension.  Home blood pressure measurements reviewed as well.  Refilled Norvasc  at 5 mg daily and continue to track measurements at home.  4.  Mixed hyperlipidemia.  Continue Crestor  5 mg daily.  HDL 79 and LDL 69 in March.  Disposition:  Follow up 6 months.  Signed, Jayson JUDITHANN Sierras, M.D., F.A.C.C. [facility] at [facility]

## 2024-10-23 ENCOUNTER — Ambulatory Visit
Admission: RE | Admit: 2024-10-23 | Discharge: 2024-10-23 | Disposition: A | Discharge status: Home / Self Care | Source: Ambulatory Visit | Attending: Family Medicine | Admitting: Family Medicine

## 2024-10-23 DIAGNOSIS — Z1231 Encounter for screening mammogram for malignant neoplasm of breast: Secondary | ICD-10-CM | POA: Insufficient documentation

## 2025-02-26 ENCOUNTER — Ambulatory Visit: Admitting: Urology
